# Patient Record
Sex: Female | Born: 1970 | Race: Black or African American | Hispanic: No | Marital: Married | Smoking: Current every day smoker
Health system: Southern US, Community
[De-identification: ages and names within clinical notes are randomized; demographics above are authoritative.]

## PROBLEM LIST (undated history)

## (undated) ENCOUNTER — Emergency Department (HOSPITAL_COMMUNITY): Payer: Self-pay | Source: Home / Self Care

## (undated) DIAGNOSIS — E7800 Pure hypercholesterolemia, unspecified: Secondary | ICD-10-CM

## (undated) DIAGNOSIS — I10 Essential (primary) hypertension: Secondary | ICD-10-CM

## (undated) HISTORY — PX: TUBAL LIGATION: SHX77

## (undated) HISTORY — PX: HERNIA REPAIR: SHX51

---

## 2001-05-01 ENCOUNTER — Emergency Department (HOSPITAL_COMMUNITY): Admission: EM | Admit: 2001-05-01 | Discharge: 2001-05-01 | Payer: Self-pay | Admitting: Emergency Medicine

## 2001-12-22 ENCOUNTER — Emergency Department (HOSPITAL_COMMUNITY): Admission: EM | Admit: 2001-12-22 | Discharge: 2001-12-22 | Payer: Self-pay | Admitting: *Deleted

## 2004-01-16 ENCOUNTER — Emergency Department (HOSPITAL_COMMUNITY): Admission: EM | Admit: 2004-01-16 | Discharge: 2004-01-16 | Payer: Self-pay | Admitting: Emergency Medicine

## 2006-08-21 ENCOUNTER — Emergency Department (HOSPITAL_COMMUNITY): Admission: EM | Admit: 2006-08-21 | Discharge: 2006-08-21 | Payer: Self-pay | Admitting: Emergency Medicine

## 2007-04-05 ENCOUNTER — Emergency Department (HOSPITAL_COMMUNITY): Admission: EM | Admit: 2007-04-05 | Discharge: 2007-04-05 | Payer: Self-pay | Admitting: Emergency Medicine

## 2007-05-09 ENCOUNTER — Emergency Department (HOSPITAL_COMMUNITY): Admission: EM | Admit: 2007-05-09 | Discharge: 2007-05-09 | Payer: Self-pay | Admitting: Emergency Medicine

## 2007-10-30 ENCOUNTER — Emergency Department (HOSPITAL_COMMUNITY): Admission: EM | Admit: 2007-10-30 | Discharge: 2007-10-30 | Payer: Self-pay | Admitting: Emergency Medicine

## 2008-07-28 ENCOUNTER — Emergency Department (HOSPITAL_COMMUNITY): Admission: EM | Admit: 2008-07-28 | Discharge: 2008-07-28 | Payer: Self-pay | Admitting: Emergency Medicine

## 2009-06-24 ENCOUNTER — Emergency Department (HOSPITAL_COMMUNITY): Admission: EM | Admit: 2009-06-24 | Discharge: 2009-06-24 | Payer: Self-pay | Admitting: Emergency Medicine

## 2010-01-05 ENCOUNTER — Emergency Department (HOSPITAL_COMMUNITY): Admission: EM | Admit: 2010-01-05 | Discharge: 2010-01-05 | Payer: Self-pay | Admitting: Emergency Medicine

## 2010-04-28 ENCOUNTER — Emergency Department (HOSPITAL_COMMUNITY): Admission: EM | Admit: 2010-04-28 | Discharge: 2010-04-28 | Payer: Self-pay | Admitting: Emergency Medicine

## 2010-04-30 ENCOUNTER — Emergency Department (HOSPITAL_COMMUNITY): Admission: EM | Admit: 2010-04-30 | Discharge: 2010-04-30 | Payer: Self-pay | Admitting: Emergency Medicine

## 2010-05-11 ENCOUNTER — Emergency Department (HOSPITAL_COMMUNITY): Admission: EM | Admit: 2010-05-11 | Discharge: 2010-05-11 | Payer: Self-pay | Admitting: Emergency Medicine

## 2010-09-12 ENCOUNTER — Emergency Department (HOSPITAL_COMMUNITY)
Admission: EM | Admit: 2010-09-12 | Discharge: 2010-09-12 | Payer: Self-pay | Source: Home / Self Care | Admitting: Emergency Medicine

## 2011-02-18 ENCOUNTER — Emergency Department (HOSPITAL_COMMUNITY)
Admission: EM | Admit: 2011-02-18 | Discharge: 2011-02-18 | Disposition: A | Discharge status: Home / Self Care | Payer: Self-pay | Attending: Emergency Medicine | Admitting: Emergency Medicine

## 2011-02-18 DIAGNOSIS — L02219 Cutaneous abscess of trunk, unspecified: Secondary | ICD-10-CM | POA: Insufficient documentation

## 2011-02-18 DIAGNOSIS — I10 Essential (primary) hypertension: Secondary | ICD-10-CM | POA: Insufficient documentation

## 2011-04-29 ENCOUNTER — Encounter: Payer: Self-pay | Admitting: Emergency Medicine

## 2011-04-29 ENCOUNTER — Emergency Department (HOSPITAL_COMMUNITY)
Admission: EM | Admit: 2011-04-29 | Discharge: 2011-04-29 | Disposition: A | Discharge status: Home / Self Care | Payer: Self-pay | Attending: Emergency Medicine | Admitting: Emergency Medicine

## 2011-04-29 DIAGNOSIS — K611 Rectal abscess: Secondary | ICD-10-CM

## 2011-04-29 DIAGNOSIS — I10 Essential (primary) hypertension: Secondary | ICD-10-CM | POA: Insufficient documentation

## 2011-04-29 DIAGNOSIS — K612 Anorectal abscess: Secondary | ICD-10-CM | POA: Insufficient documentation

## 2011-04-29 DIAGNOSIS — F17200 Nicotine dependence, unspecified, uncomplicated: Secondary | ICD-10-CM | POA: Insufficient documentation

## 2011-04-29 HISTORY — DX: Essential (primary) hypertension: I10

## 2011-04-29 MED ORDER — CEPHALEXIN 500 MG PO CAPS: 500.0000 mg | ORAL_CAPSULE | Freq: Four times a day (QID) | ORAL | Status: AC

## 2011-04-29 MED ORDER — HYDROCODONE-ACETAMINOPHEN 5-325 MG PO TABS: 1.0000 | ORAL_TABLET | ORAL | Status: AC | PRN

## 2011-04-29 MED ORDER — SULFAMETHOXAZOLE-TRIMETHOPRIM 800-160 MG PO TABS: 1.0000 | ORAL_TABLET | Freq: Two times a day (BID) | ORAL | Status: AC

## 2011-04-29 MED ORDER — HYDROCODONE-ACETAMINOPHEN 5-325 MG PO TABS
1.0000 | ORAL_TABLET | Freq: Once | ORAL | Status: AC
  Administered 2011-04-29: 1 via ORAL
  Filled 2011-04-29: qty 1

## 2011-04-29 NOTE — ED Notes (Signed)
C/o perirectal abscess---onset 2-3 days ago---had prior episode about 4 mos. Ago which improved with oral antibiotics but, did not go away completely.  C/o pain, no drainage and is not affecting bowel activity.

## 2011-04-29 NOTE — ED Provider Notes (Signed)
History     CSN: [id number] Arrival date & time: 04/29/2011 10:11 AM  Chief Complaint  Patient presents with  . Abscess   Patient is a 40 y.o. female presenting with abscess. The history is provided by the patient.  Abscess  This is a recurrent problem. The current episode started less than one week ago. The problem occurs continuously. Progression since onset: Had absess in same location 4 months ago,  improved with abx and warm soaks,  became small and hard,  never went away.  Flared up again 2 days agol. Affected Location: Perirectal area. The problem is moderate. The abscess is characterized by painfulness and swelling. Pertinent negatives include no fever and no diarrhea.    Past Medical History  Diagnosis Date  . Hypertension     Past Surgical History  Procedure Date  . Hernia repair   . Tubal ligation     Family History  Problem Relation Age of Onset  . [name]   . Stroke Brother   . Heart attack Brother     History  Substance Use Topics  . Smoking status: Current Everyday Smoker -- 0.2 packs/day  . Smokeless tobacco: Not on file  . Alcohol Use: No    OB History    Grav Para Term Preterm Abortions TAB SAB Ect Mult Living   3 3 3       3       Review of Systems  Constitutional: Negative for fever.  Gastrointestinal: Negative for diarrhea.  All other systems reviewed and are negative.    Physical Exam  BP 137/80  Pulse 60  Temp 98.2 F (36.8 C)  Resp 18  Ht 4\' 11"  (1.499 m)  Wt 158 lb (71.668 kg)  BMI 31.91 kg/m2  SpO2 100%  LMP 04/11/2011  Physical Exam  Nursing note and vitals reviewed. Constitutional: She is oriented to person, place, and time. She appears well-developed and well-nourished.  HENT:  Head: Normocephalic and atraumatic.  Eyes: Conjunctivae are normal.  Neck: Normal range of motion.  Cardiovascular: Normal rate and regular rhythm.   Pulmonary/Chest: Effort normal and breath sounds normal. She has no wheezes.    Abdominal: Soft. Bowel sounds are normal. She exhibits no distension. There is no tenderness.  Genitourinary: Vaginal discharge: Indurated,  linear,  deep palpable mass radiating from anus approx 9 oclock position.  No drainage no fluctuance.  Pain and induration noted along this wall with digital rectal exam,  no drainage.  Musculoskeletal: Normal range of motion.  Neurological: She is alert and oriented to person, place, and time.  Skin: Skin is warm and dry.  Psychiatric: She has a normal mood and affect.    ED Course  Procedures  MDM Spoke with Dr. Lovell Sheehan, will see pt in office in 4 days.  In interim,  Pain meds,  Abx,  Warm soaks.  Abscess indurated,  No fluctuance appreciated,  Early for consideration of I & D.      Candis Musa, PA 04/29/11 1155

## 2011-04-29 NOTE — ED Notes (Signed)
Pt c/o boil to right buttock x 2 days.

## 2011-04-29 NOTE — ED Notes (Signed)
Pt. Stable--awaiting general surgery call back--Kept advised of status

## 2011-05-03 NOTE — ED Provider Notes (Signed)
Medical screening examination/treatment/procedure(s) were performed by non-physician practitioner and as supervising physician I was immediately available for consultation/collaboration.  Benny Lennert, MD 05/03/11 1020

## 2011-05-15 ENCOUNTER — Emergency Department (HOSPITAL_COMMUNITY)
Admission: EM | Admit: 2011-05-15 | Discharge: 2011-05-15 | Discharge status: Against Medical Advice | Payer: Self-pay | Attending: Emergency Medicine | Admitting: Emergency Medicine

## 2011-05-15 ENCOUNTER — Encounter (HOSPITAL_COMMUNITY): Payer: Self-pay

## 2011-05-15 DIAGNOSIS — T148 Other injury of unspecified body region: Secondary | ICD-10-CM | POA: Insufficient documentation

## 2011-05-15 DIAGNOSIS — Z5320 Procedure and treatment not carried out because of patient's decision for unspecified reasons: Secondary | ICD-10-CM | POA: Insufficient documentation

## 2011-05-15 DIAGNOSIS — L0291 Cutaneous abscess, unspecified: Secondary | ICD-10-CM

## 2011-05-15 DIAGNOSIS — W57XXXA Bitten or stung by nonvenomous insect and other nonvenomous arthropods, initial encounter: Secondary | ICD-10-CM | POA: Insufficient documentation

## 2011-05-15 NOTE — ED Notes (Signed)
Pt reports a boil to her right buttocks.  Pt reports coming here a few weeks ago and got a prescription for an antibiotic.  Pt took all of it w/out change.  Pt also reports raised area to her nose that she thinks was from a bug bite.  Pt denies any fever.  nad noted

## 2011-06-19 NOTE — ED Provider Notes (Signed)
Pt left AMA prior to eval  Worthy Rancher, [place] 06/19/11 1354

## 2011-06-22 NOTE — ED Provider Notes (Signed)
Medical screening examination/treatment/procedure(s) were performed by non-physician practitioner and as supervising physician I was immediately available for consultation/collaboration.   Cyndra Numbers, MD 06/22/11 2240

## 2011-08-13 ENCOUNTER — Emergency Department (HOSPITAL_COMMUNITY)
Admission: EM | Admit: 2011-08-13 | Discharge: 2011-08-13 | Disposition: A | Discharge status: Home / Self Care | Payer: Self-pay | Attending: Emergency Medicine | Admitting: Emergency Medicine

## 2011-08-13 ENCOUNTER — Emergency Department (HOSPITAL_COMMUNITY): Payer: Self-pay

## 2011-08-13 ENCOUNTER — Encounter (HOSPITAL_COMMUNITY): Payer: Self-pay | Admitting: *Deleted

## 2011-08-13 DIAGNOSIS — I10 Essential (primary) hypertension: Secondary | ICD-10-CM | POA: Insufficient documentation

## 2011-08-13 DIAGNOSIS — L0291 Cutaneous abscess, unspecified: Secondary | ICD-10-CM

## 2011-08-13 DIAGNOSIS — F17200 Nicotine dependence, unspecified, uncomplicated: Secondary | ICD-10-CM | POA: Insufficient documentation

## 2011-08-13 DIAGNOSIS — L02219 Cutaneous abscess of trunk, unspecified: Secondary | ICD-10-CM | POA: Insufficient documentation

## 2011-08-13 DIAGNOSIS — J111 Influenza due to unidentified influenza virus with other respiratory manifestations: Secondary | ICD-10-CM | POA: Insufficient documentation

## 2011-08-13 DIAGNOSIS — L03319 Cellulitis of trunk, unspecified: Secondary | ICD-10-CM | POA: Insufficient documentation

## 2011-08-13 MED ORDER — IBUPROFEN 800 MG PO TABS: 800.0000 mg | ORAL_TABLET | Freq: Three times a day (TID) | ORAL | Status: AC | PRN

## 2011-08-13 MED ORDER — ACETAMINOPHEN 160 MG/5ML PO SOLN
650.0000 mg | Freq: Once | ORAL | Status: AC
  Administered 2011-08-13: 650 mg via ORAL
  Filled 2011-08-13: qty 20.3

## 2011-08-13 MED ORDER — DOXYCYCLINE HYCLATE 100 MG PO CAPS: 100.0000 mg | ORAL_CAPSULE | Freq: Two times a day (BID) | ORAL | Status: AC

## 2011-08-13 NOTE — ED Notes (Signed)
Pt a/ox4. resp even and unlabored. NAD at this time. D/C instruction and rx x2 reviewed with pt. Pt verbalized understanding. Pt ambulated to lobby with steady gate.

## 2011-08-13 NOTE — ED Notes (Signed)
Pt presents with fever, cough, and chest congestion. Pt states cough was productive but now is not. Pt also reports "boil' to vaginal area. NAD at this time. Pt calm and cooperative.

## 2011-08-13 NOTE — ED Notes (Signed)
Pt states fever, cough, and congestion began last night. Cough was productive last night, green in color. Nonproductive today, per pt. Pt also states boil to vaginal area since yesterday.

## 2011-08-14 NOTE — ED Provider Notes (Signed)
History     CSN: [id number] Arrival date & time: 08/13/2011 11:51 AM   First MD Initiated Contact with Patient 08/13/11 1228      Chief Complaint  Patient presents with  . Cough    (Consider location/radiation/quality/duration/timing/severity/associated sxs/prior treatment) Patient is a 40 y.o. female presenting with flu symptoms. The history is provided by the patient.  Influenza This is a new problem. The current episode started yesterday. The problem occurs constantly. Associated symptoms include chills, congestion, coughing, fatigue, a fever and myalgias. Pertinent negatives include no abdominal pain, arthralgias, change in bowel habit, chest pain, headaches, joint swelling, nausea, neck pain, numbness, rash, sore throat, vomiting or weakness. Associated symptoms comments: She would also like a nodule in her groin evaluated which started several days ago and is painful to palpation.  She is concerned she is developing an abscess.  It is not draining.  She has found no alleviators for this pain.  Palpation and rubbing on clothing makes worse.. The symptoms are aggravated by nothing. She has tried nothing for the symptoms.    Past Medical History  Diagnosis Date  . Hypertension     Past Surgical History  Procedure Date  . Hernia repair   . Tubal ligation   . Tubal ligation     Family History  Problem Relation Age of Onset  . [name]   . Stroke Brother   . Heart attack Brother     History  Substance Use Topics  . Smoking status: Current Everyday Smoker -- 0.2 packs/day  . Smokeless tobacco: Not on file  . Alcohol Use: No    OB History    Grav Para Term Preterm Abortions TAB SAB Ect Mult Living   3 3 3       3       Review of Systems  Constitutional: Positive for fever, chills and fatigue.  HENT: Positive for congestion. Negative for sore throat and neck pain.   Eyes: Negative.   Respiratory: Positive for cough. Negative for chest tightness and shortness  of breath.   Cardiovascular: Negative for chest pain.  Gastrointestinal: Negative for nausea, vomiting, abdominal pain and change in bowel habit.  Genitourinary: Negative.   Musculoskeletal: Positive for myalgias. Negative for joint swelling and arthralgias.  Skin: Negative for rash and wound.  Neurological: Negative for dizziness, weakness, light-headedness, numbness and headaches.  Hematological: Negative.   Psychiatric/Behavioral: Negative.     Allergies  Review of patient's allergies indicates no known allergies.  Home Medications   Current Outpatient Rx  Name Route Sig Dispense Refill  . ATENOLOL 25 MG PO TABS Oral Take 25 mg by mouth daily.      Marland Kitchen DOXYCYCLINE HYCLATE 100 MG PO CAPS Oral Take 1 capsule (100 mg total) by mouth 2 (two) times daily. 20 capsule 0  . IBUPROFEN 200 MG PO TABS Oral Take 400 mg by mouth every 6 (six) hours as needed. For pain     . IBUPROFEN 800 MG PO TABS Oral Take 1 tablet (800 mg total) by mouth every 8 (eight) hours as needed for pain. 21 tablet 0    BP 144/74  Pulse 85  Temp(Src) 97.2 F (36.2 C) (Oral)  Resp 20  Ht 4\' 11"  (1.499 m)  Wt 167 lb (75.751 kg)  BMI 33.73 kg/m2  SpO2 100%  LMP 08/06/2011  Physical Exam  Nursing note and vitals reviewed. Constitutional: She is oriented to person, place, and time. She appears well-developed and well-nourished.  HENT:  Head:  Normocephalic and atraumatic.  Right Ear: Tympanic membrane, external ear and ear canal normal.  Left Ear: Tympanic membrane, external ear and ear canal normal.  Nose: Rhinorrhea present.  Mouth/Throat: Uvula is midline and mucous membranes are normal. No oropharyngeal exudate, posterior oropharyngeal edema, posterior oropharyngeal erythema or tonsillar abscesses.  Eyes: Conjunctivae are normal.  Neck: Normal range of motion.  Cardiovascular: Normal rate, regular rhythm, normal heart sounds and intact distal pulses.   Pulmonary/Chest: Effort normal and breath sounds  normal. She has no wheezes.  Abdominal: Soft. Bowel sounds are normal. There is no tenderness.  Musculoskeletal: Normal range of motion.  Neurological: She is alert and oriented to person, place, and time.  Skin: Skin is warm and dry.       Small tender papule right lower mons pubis without drainage or fluctuance.   Psychiatric: She has a normal mood and affect.    ED Course  Procedures (including critical care time)  Labs Reviewed - No data to display Dg Chest 2 View  08/13/2011  *RADIOLOGY REPORT*  Clinical Data: Cough.  CHEST - 2 VIEW  Comparison: PA and lateral chest 10/30/2007.  Findings: Lungs are clear.  Heart size is normal.  No pneumothorax or pleural effusion.  IMPRESSION: No acute disease.  Original Report Authenticated By: Bernadene Bell. D'ALESSIO, M.D.     1. Influenza   2. Abscess       MDM  Influenza vs nonspecific viral uri.  Papule with no signs suggesting necessity of I & D.  Warm soaks,  doxycycline prescribed.  Ibuprofen for pain,  Fever,  Myalgias.  The patient appears reasonably screened and/or stabilized for discharge and I doubt any other medical condition or other [facility] requiring further screening, evaluation, or treatment in the ED at this time prior to discharge.         Candis Musa, PA 08/14/11 2213

## 2011-08-16 NOTE — ED Provider Notes (Signed)
Medical screening examination/treatment/procedure(s) were conducted as a shared visit with non-physician practitioner(s) and myself.  I personally evaluated the patient during the encounter.  History and physical most consistent with viral syndrome.  Donnetta Hutching, MD 08/16/11 [phone]

## 2012-08-13 ENCOUNTER — Emergency Department (HOSPITAL_COMMUNITY)
Admission: EM | Admit: 2012-08-13 | Discharge: 2012-08-13 | Disposition: A | Discharge status: Home / Self Care | Payer: Self-pay | Attending: Emergency Medicine | Admitting: Emergency Medicine

## 2012-08-13 ENCOUNTER — Encounter (HOSPITAL_COMMUNITY): Payer: Self-pay | Admitting: *Deleted

## 2012-08-13 DIAGNOSIS — Z79899 Other long term (current) drug therapy: Secondary | ICD-10-CM | POA: Insufficient documentation

## 2012-08-13 DIAGNOSIS — Y939 Activity, unspecified: Secondary | ICD-10-CM | POA: Insufficient documentation

## 2012-08-13 DIAGNOSIS — I10 Essential (primary) hypertension: Secondary | ICD-10-CM | POA: Insufficient documentation

## 2012-08-13 DIAGNOSIS — F17200 Nicotine dependence, unspecified, uncomplicated: Secondary | ICD-10-CM | POA: Insufficient documentation

## 2012-08-13 DIAGNOSIS — IMO0002 Reserved for concepts with insufficient information to code with codable children: Secondary | ICD-10-CM | POA: Insufficient documentation

## 2012-08-13 DIAGNOSIS — Y929 Unspecified place or not applicable: Secondary | ICD-10-CM | POA: Insufficient documentation

## 2012-08-13 DIAGNOSIS — E7800 Pure hypercholesterolemia, unspecified: Secondary | ICD-10-CM | POA: Insufficient documentation

## 2012-08-13 DIAGNOSIS — S29019A Strain of muscle and tendon of unspecified wall of thorax, initial encounter: Secondary | ICD-10-CM

## 2012-08-13 DIAGNOSIS — X58XXXA Exposure to other specified factors, initial encounter: Secondary | ICD-10-CM | POA: Insufficient documentation

## 2012-08-13 HISTORY — DX: Pure hypercholesterolemia, unspecified: E78.00

## 2012-08-13 MED ORDER — CYCLOBENZAPRINE HCL 10 MG PO TABS: 10.0000 mg | ORAL_TABLET | Freq: Three times a day (TID) | ORAL | Status: DC | PRN

## 2012-08-13 MED ORDER — HYDROCODONE-ACETAMINOPHEN 5-325 MG PO TABS: 1.0000 | ORAL_TABLET | ORAL | Status: DC | PRN

## 2012-08-13 NOTE — ED Provider Notes (Signed)
History   This chart was scribed for Veronica Human, MD by Toya Smothers, ED Scribe. The patient was seen in room APA01/APA01. Patient's care was started at 0922.  CSN: [id number]  Arrival date & time 08/13/12  4098   First MD Initiated Contact with Patient 08/13/12 [phone]      Chief Complaint  Patient presents with  . Back Pain   HPI  Veronica Arnold is a 41 y.o. female with h/o HTN and high cholesterol, who presents to the Emergency Department complaining of 1 day of sudden onset, constant, moderate mid thoracic and lower rib back pain. Pt does not recall the context of onset, though denies injury mechanism. Pain is non-radiating, aggravated by palpation, and alleviated by nothing. Pt reports mild relief with 800 mg Ibuprofen. No neck pain, gaiting difficulty, abdominal pain, fever, chest pain, SOB, or n/v/d. Pt is a current half a pack a day smoker who denies alcohol and illicit drug use. Surgical Hx includes hernia repair and tubal ligations.   Past Medical History  Diagnosis Date  . Hypertension   . High cholesterol     Past Surgical History  Procedure Date  . Hernia repair   . Tubal ligation   . Tubal ligation     Family History  Problem Relation Age of Onset  . [name]   . Stroke Brother   . Heart attack Brother     History  Substance Use Topics  . Smoking status: Current Every Day Smoker -- 0.2 packs/day    Types: Cigarettes  . Smokeless tobacco: Not on file  . Alcohol Use: No    OB History    Grav Para Term Preterm Abortions TAB SAB Ect Mult Living   3 3 3       3       Review of Systems  Musculoskeletal: Positive for back pain. Negative for gait problem.  All other systems reviewed and are negative.    Allergies  Review of patient's allergies indicates no known allergies.  Home Medications   Current Outpatient Rx  Name  Route  Sig  Dispense  Refill  . ATENOLOL 25 MG PO TABS   Oral   Take 25 mg by mouth daily.           Marland Kitchen  PRESCRIPTION MEDICATION   Oral   Take 1 tablet by mouth at bedtime. Cholesterol medication unknown name and strength           BP 121/66  Pulse 62  Temp 98.3 F (36.8 C) (Oral)  Resp 16  Ht 4\' 11"  (1.499 m)  Wt 155 lb (70.308 kg)  BMI 31.31 kg/m2  SpO2 99%  LMP 08/06/2012  Physical Exam  Nursing note and vitals reviewed. Constitutional: She is oriented to person, place, and time. She appears well-developed and well-nourished. No distress.  HENT:  Head: Normocephalic and atraumatic.  Mouth/Throat: Oropharynx is clear and moist. No oropharyngeal exudate.  Eyes: Conjunctivae normal and EOM are normal. Pupils are equal, round, and reactive to light.  Neck: Normal range of motion. Neck supple. No tracheal deviation present.  Cardiovascular: Normal rate, regular rhythm and normal heart sounds.   No murmur heard. Pulmonary/Chest: Effort normal and breath sounds normal.  Abdominal: Soft. Bowel sounds are normal.  Musculoskeletal: Normal range of motion.       No deformity of lower thoracic and lower ribs.  Neurological: She is alert and oriented to person, place, and time.  Skin: Skin is warm and dry.  She is not diaphoretic.  Psychiatric: She has a normal mood and affect.    ED Course  Procedures DIAGNOSTIC STUDIES: Oxygen Saturation is 99% on room air, normal by my interpretation.    COORDINATION OF CARE: 10:16- Evaluated Pt. Pt is awake, alert, and without distress. 10:21- Patient understand and agree with initial ED impression and plan with expectations set for ED visit.  Rx for thoracic strain with Flexeril and Norco.     1. Strain of thoracic region     I personally performed the services described in this documentation, which was scribed in my presence. The recorded information has been reviewed and is accurate.  Veronica Arnold, M.D.      Carleene Cooper III, MD 08/13/12 [phone]

## 2012-08-13 NOTE — ED Notes (Signed)
Mid to lower back pain since yesterday.  Denies injury.

## 2013-02-10 ENCOUNTER — Emergency Department (HOSPITAL_COMMUNITY)
Admission: EM | Admit: 2013-02-10 | Discharge: 2013-02-10 | Disposition: A | Discharge status: Home / Self Care | Payer: Self-pay | Attending: Emergency Medicine | Admitting: Emergency Medicine

## 2013-02-10 ENCOUNTER — Encounter (HOSPITAL_COMMUNITY): Payer: Self-pay

## 2013-02-10 DIAGNOSIS — Z79899 Other long term (current) drug therapy: Secondary | ICD-10-CM | POA: Insufficient documentation

## 2013-02-10 DIAGNOSIS — I10 Essential (primary) hypertension: Secondary | ICD-10-CM | POA: Insufficient documentation

## 2013-02-10 DIAGNOSIS — E7800 Pure hypercholesterolemia, unspecified: Secondary | ICD-10-CM | POA: Insufficient documentation

## 2013-02-10 DIAGNOSIS — M5431 Sciatica, right side: Secondary | ICD-10-CM

## 2013-02-10 DIAGNOSIS — M5430 Sciatica, unspecified side: Secondary | ICD-10-CM | POA: Insufficient documentation

## 2013-02-10 DIAGNOSIS — M79609 Pain in unspecified limb: Secondary | ICD-10-CM | POA: Insufficient documentation

## 2013-02-10 MED ORDER — CYCLOBENZAPRINE HCL 10 MG PO TABS: 10.0000 mg | ORAL_TABLET | Freq: Two times a day (BID) | ORAL | Status: DC | PRN

## 2013-02-10 MED ORDER — HYDROCODONE-ACETAMINOPHEN 5-325 MG PO TABS: 1.0000 | ORAL_TABLET | ORAL | Status: DC | PRN

## 2013-02-10 NOTE — ED Provider Notes (Signed)
History     CSN: [id number]  Arrival date & time 02/10/13  0909   First MD Initiated Contact with Patient 02/10/13 [phone]      Chief Complaint  Patient presents with  . Back Pain    (Consider location/radiation/quality/duration/timing/severity/associated sxs/prior treatment) Patient is a 42 y.o. female presenting with back pain. The history is provided by the patient.  Back Pain Location:  Lumbar spine Quality:  Stabbing Radiates to:  R posterior upper leg Pain severity:  Severe Pain is:  Same all the time Onset quality:  Gradual Duration:  3 days Timing:  Constant Progression:  Worsening Chronicity:  New Relieved by:  Nothing Worsened by:  Movement, standing and twisting Ineffective treatments:  Ibuprofen Associated symptoms: leg pain   Associated symptoms: no abdominal pain, no bladder incontinence, no bowel incontinence, no chest pain, no dysuria, no fever, no headaches, no numbness and no weakness    Ellarae A Nodine is a 42 y.o. female who presents to the ED with low back pain. The pain started 3 days ago. She rates the pain as 8/10. She went to her doctor when the pain first started and he told her to take Aleve. She took Aleve and it did not help. She denies any injury or UTI symptoms.   Past Medical History  Diagnosis Date  . Hypertension   . High cholesterol     Past Surgical History  Procedure Laterality Date  . Hernia repair    . Tubal ligation    . Tubal ligation      Family History  Problem Relation Age of Onset  . [name]   . Stroke Brother   . Heart attack Brother     History  Substance Use Topics  . Smoking status: Current Every Day Smoker -- 0.25 packs/day    Types: Cigarettes  . Smokeless tobacco: Not on file  . Alcohol Use: No    OB History   Grav Para Term Preterm Abortions TAB SAB Ect Mult Living   3 3 3       3       Review of Systems  Constitutional: Negative for fever and chills.  HENT: Negative for neck pain.     Respiratory: Negative for cough and chest tightness.   Cardiovascular: Negative for chest pain.  Gastrointestinal: Negative for nausea, vomiting, abdominal pain and bowel incontinence.  Genitourinary: Negative for bladder incontinence, dysuria, urgency and frequency.  Musculoskeletal: Positive for back pain.  Skin: Negative for rash.  Neurological: Negative for weakness, numbness and headaches.  Psychiatric/Behavioral: The patient is not nervous/anxious.     Allergies  Review of patient's allergies indicates no known allergies.  Home Medications   Current Outpatient Rx  Name  Route  Sig  Dispense  Refill  . atenolol (TENORMIN) 25 MG tablet   Oral   Take 25 mg by mouth daily.           . cyclobenzaprine (FLEXERIL) 10 MG tablet   Oral   Take 1 tablet (10 mg total) by mouth 3 (three) times daily as needed for muscle spasms.   15 tablet   0   . HYDROcodone-acetaminophen (NORCO/VICODIN) 5-325 MG per tablet   Oral   Take 1 tablet by mouth every 4 (four) hours as needed for pain.   20 tablet   0   . PRESCRIPTION MEDICATION   Oral   Take 1 tablet by mouth at bedtime. Cholesterol medication unknown name and strength  BP 134/70  Pulse 82  Temp(Src) 98.3 F (36.8 C) (Oral)  Resp 20  Ht 4\' 11"  (1.499 m)  Wt 169 lb (76.658 kg)  BMI 34.12 kg/m2  SpO2 99%  LMP 01/23/2013  Physical Exam  Nursing note and vitals reviewed. Constitutional: She is oriented to person, place, and time. She appears well-developed and well-nourished. No distress.  HENT:  Head: Normocephalic.  Eyes: EOM are normal.  Neck: Normal range of motion. Neck supple.  Cardiovascular: Normal rate, regular rhythm and normal heart sounds.   Pulmonary/Chest: Effort normal and breath sounds normal.  Abdominal: Soft. There is no tenderness.  Musculoskeletal:       Lumbar back: She exhibits decreased range of motion, tenderness and spasm. She exhibits normal pulse.       Back:  Pedal pulses  equal bilateral. Adequate circulation, good touch sensation.  Ambulatory with steady gait. No foot drag.  Neurological: She is alert and oriented to person, place, and time. She has normal strength and normal reflexes. No cranial nerve deficit or sensory deficit. Gait normal.  Skin: Skin is warm and dry.  Psychiatric: She has a normal mood and affect. Her behavior is normal. Judgment and thought content normal.    ED Course  Procedures (including critical care time)  MDM  42 y.o. female with low back pain x 3 days. No neurological deficits. Will treat with muscle relaxant and pain management, she will continue Aleve. She is to follow up with her PCP or return here as needed. Patient stable for discharge home without any immediate complications.  Discussed with the patient and all questioned fully answered. She will return if any problems arise.     Medication List    TAKE these medications       cyclobenzaprine 10 MG tablet  Commonly known as:  FLEXERIL  Take 1 tablet (10 mg total) by mouth 2 (two) times daily as needed for muscle spasms.     HYDROcodone-acetaminophen 5-325 MG per tablet  Commonly known as:  NORCO/VICODIN  Take 1 tablet by mouth every 4 (four) hours as needed.      ASK your doctor about these medications       atenolol 25 MG tablet  Commonly known as:  TENORMIN  Take 25 mg by mouth daily.     simvastatin 20 MG tablet  Commonly known as:  ZOCOR  Take 20 mg by mouth every evening.              [address] [place], [place] 02/10/13 [phone]

## 2013-02-10 NOTE — ED Notes (Signed)
Pt presents to er with c/o lower back pain that radiates down bilateral legs, denies any injury, pain started a few days ago and is worse with movement, pt states that she [facility], had urine sample and blood work performed, advised everything was negative and to take aleve for the pain. Pt states that the aleve is not helping with the pain.

## 2013-02-10 NOTE — ED Notes (Signed)
Pt reports back pain for 3 days, denies any known injury, no urinary s.s.

## 2013-02-10 NOTE — ED Provider Notes (Signed)
Medical screening examination/treatment/procedure(s) were performed by non-physician practitioner and as supervising physician I was immediately available for consultation/collaboration.  Sukhraj Esquivias, MD 02/10/13 1612 

## 2013-04-13 ENCOUNTER — Emergency Department (HOSPITAL_COMMUNITY)
Admission: EM | Admit: 2013-04-13 | Discharge: 2013-04-13 | Disposition: A | Discharge status: Home / Self Care | Payer: Self-pay | Attending: Emergency Medicine | Admitting: Emergency Medicine

## 2013-04-13 ENCOUNTER — Encounter (HOSPITAL_COMMUNITY): Payer: Self-pay | Admitting: *Deleted

## 2013-04-13 DIAGNOSIS — I10 Essential (primary) hypertension: Secondary | ICD-10-CM | POA: Insufficient documentation

## 2013-04-13 DIAGNOSIS — L0231 Cutaneous abscess of buttock: Secondary | ICD-10-CM | POA: Insufficient documentation

## 2013-04-13 DIAGNOSIS — E7800 Pure hypercholesterolemia, unspecified: Secondary | ICD-10-CM | POA: Insufficient documentation

## 2013-04-13 DIAGNOSIS — F17200 Nicotine dependence, unspecified, uncomplicated: Secondary | ICD-10-CM | POA: Insufficient documentation

## 2013-04-13 DIAGNOSIS — Z79899 Other long term (current) drug therapy: Secondary | ICD-10-CM | POA: Insufficient documentation

## 2013-04-13 MED ORDER — SULFAMETHOXAZOLE-TRIMETHOPRIM 800-160 MG PO TABS: 1.0000 | ORAL_TABLET | Freq: Two times a day (BID) | ORAL | Status: DC

## 2013-04-13 MED ORDER — TRAMADOL HCL 50 MG PO TABS: 50.0000 mg | ORAL_TABLET | Freq: Four times a day (QID) | ORAL | Status: DC | PRN

## 2013-04-13 NOTE — ED Provider Notes (Signed)
CSN: [id number]     Arrival date & time 04/13/13  0807 History     First MD Initiated Contact with Patient 04/13/13 [phone]     Chief Complaint  Patient presents with  . Abscess   (Consider location/radiation/quality/duration/timing/severity/associated sxs/prior Treatment) Patient is a 42 y.o. female presenting with abscess. The history is provided by the patient.  Abscess Location:  Ano-genital Ano-genital abscess location:  R buttock Size:  0.5 cm Abscess quality: induration, painful and redness   Abscess quality: not draining, no fluctuance and not weeping   Red streaking: no   Duration:  4 days Progression:  Worsening Pain details:    Quality:  Sharp   Severity:  Moderate   Timing:  Intermittent   Progression:  Worsening Chronicity:  Recurrent Context: not diabetes   Context comment:  She reports history of abscess at the same site 2 years ago which drained spontaneously Relieved by:  Nothing Worsened by:  Draining/squeezing Ineffective treatments:  Tar ointment Associated symptoms: no fever and no nausea     Past Medical History  Diagnosis Date  . Hypertension   . High cholesterol    Past Surgical History  Procedure Laterality Date  . Hernia repair    . Tubal ligation    . Tubal ligation     Family History  Problem Relation Age of Onset  . [name]   . Stroke Brother   . Heart attack Brother    History  Substance Use Topics  . Smoking status: Current Every Day Smoker -- 0.25 packs/day    Types: Cigarettes  . Smokeless tobacco: Not on file  . Alcohol Use: No   OB History   Grav Para Term Preterm Abortions TAB SAB Ect Mult Living   3 3 3       3      Review of Systems  Constitutional: Negative for fever and chills.  HENT: Negative for facial swelling.   Respiratory: Negative for shortness of breath and wheezing.   Gastrointestinal: Negative for nausea.  Skin: Positive for wound.  Neurological: Negative for numbness.    Allergies  Review  of patient's allergies indicates no known allergies.  Home Medications   Current Outpatient Rx  Name  Route  Sig  Dispense  Refill  . atenolol (TENORMIN) 25 MG tablet   Oral   Take 25 mg by mouth daily.           . cyclobenzaprine (FLEXERIL) 10 MG tablet   Oral   Take 1 tablet (10 mg total) by mouth 2 (two) times daily as needed for muscle spasms.   20 tablet   0   . HYDROcodone-acetaminophen (NORCO/VICODIN) 5-325 MG per tablet   Oral   Take 1 tablet by mouth every 4 (four) hours as needed.   15 tablet   0   . simvastatin (ZOCOR) 20 MG tablet   Oral   Take 20 mg by mouth every evening.         . sulfamethoxazole-trimethoprim (SEPTRA DS) 800-160 MG per tablet   Oral   Take 1 tablet by mouth every 12 (twelve) hours.   20 tablet   0   . traMADol (ULTRAM) 50 MG tablet   Oral   Take 1 tablet (50 mg total) by mouth every 6 (six) hours as needed for pain.   20 tablet   0    BP 127/61  Pulse 67  Temp(Src) 98.8 F (37.1 C) (Oral)  Resp 16  Ht 4\' 11"  (1.499  m)  Wt 169 lb (76.658 kg)  BMI 34.12 kg/m2  SpO2 98%  LMP 03/17/2013 Physical Exam  Constitutional: She appears well-developed and well-nourished. No distress.  HENT:  Head: Normocephalic.  Neck: Neck supple.  Cardiovascular: Normal rate.   Pulmonary/Chest: Effort normal. She has no wheezes.  Musculoskeletal: Normal range of motion. She exhibits no edema.  Skin:  0.5 cm small, nontenting induration right medial buttock with no radiation toward anus.  Slight 1 cm surrounding erythema.  No drainage.    ED Course   Procedures (including critical care time)  Labs Reviewed - No data to display No results found. 1. Abscess of right buttock     MDM  Encouraged continued warm soaks, avoid squeezing.  Bactrim,  Tramadol , return here or see pcp if this site does not resolve spontaneously or if it enlarges, becomes more painful.  Burgess Amor, PA-C 04/13/13 [phone]

## 2013-04-13 NOTE — ED Provider Notes (Signed)
Medical screening examination/treatment/procedure(s) were performed by non-physician practitioner and as supervising physician I was immediately available for consultation/collaboration.   Rolan Bucco, MD 04/13/13 1438

## 2013-04-13 NOTE — ED Notes (Signed)
Pt states boil on buttocks x 2 days. Denies drainage. NAD

## 2013-10-20 ENCOUNTER — Emergency Department (HOSPITAL_COMMUNITY)
Admission: EM | Admit: 2013-10-20 | Discharge: 2013-10-20 | Disposition: A | Discharge status: Home / Self Care | Payer: Self-pay | Attending: Emergency Medicine | Admitting: Emergency Medicine

## 2013-10-20 ENCOUNTER — Encounter (HOSPITAL_COMMUNITY): Payer: Self-pay | Admitting: Emergency Medicine

## 2013-10-20 DIAGNOSIS — I10 Essential (primary) hypertension: Secondary | ICD-10-CM | POA: Insufficient documentation

## 2013-10-20 DIAGNOSIS — L0201 Cutaneous abscess of face: Secondary | ICD-10-CM | POA: Insufficient documentation

## 2013-10-20 DIAGNOSIS — F17200 Nicotine dependence, unspecified, uncomplicated: Secondary | ICD-10-CM | POA: Insufficient documentation

## 2013-10-20 DIAGNOSIS — E7800 Pure hypercholesterolemia, unspecified: Secondary | ICD-10-CM | POA: Insufficient documentation

## 2013-10-20 DIAGNOSIS — Z792 Long term (current) use of antibiotics: Secondary | ICD-10-CM | POA: Insufficient documentation

## 2013-10-20 DIAGNOSIS — Z79899 Other long term (current) drug therapy: Secondary | ICD-10-CM | POA: Insufficient documentation

## 2013-10-20 DIAGNOSIS — L03211 Cellulitis of face: Principal | ICD-10-CM | POA: Insufficient documentation

## 2013-10-20 DIAGNOSIS — L0291 Cutaneous abscess, unspecified: Secondary | ICD-10-CM

## 2013-10-20 MED ORDER — SULFAMETHOXAZOLE-TMP DS 800-160 MG PO TABS
1.0000 | ORAL_TABLET | Freq: Once | ORAL | Status: AC
  Administered 2013-10-20: 1 via ORAL
  Filled 2013-10-20: qty 1

## 2013-10-20 MED ORDER — HYDROCODONE-ACETAMINOPHEN 5-325 MG PO TABS
1.0000 | ORAL_TABLET | Freq: Once | ORAL | Status: AC
  Administered 2013-10-20: 1 via ORAL
  Filled 2013-10-20: qty 1

## 2013-10-20 MED ORDER — HYDROCODONE-ACETAMINOPHEN 5-325 MG PO TABS: 1.0000 | ORAL_TABLET | ORAL | Status: DC | PRN

## 2013-10-20 MED ORDER — SULFAMETHOXAZOLE-TRIMETHOPRIM 800-160 MG PO TABS: 1.0000 | ORAL_TABLET | Freq: Two times a day (BID) | ORAL | Status: DC

## 2013-10-20 NOTE — ED Provider Notes (Signed)
CSN: [id number]     Arrival date & time 10/20/13  0805 History   First MD Initiated Contact with Patient 10/20/13 [phone]     Chief Complaint  Patient presents with  . Facial Swelling     (Consider location/radiation/quality/duration/timing/severity/associated sxs/prior Treatment) HPI Comments: Veronica Arnold is a 43 y.o. Female presenting with a 1 day history of painful swelling to her right cheek.  She describes this being the site of a spider bite years ago and since has had almost yearly episodes of similar symptoms.  She has been treated with antibiotics and pain medication in the past including warm compresses with complete resolution of this problem until recurrence.  She denies any knots or nodules at the site chronically.  She also denies dental pain.  She denies fevers or chills and there is no radiation of pain which is aching, sharp with palpation.  She's taken no medications prior to arrival for this problem.    The history is provided by the patient.    Past Medical History  Diagnosis Date  . Hypertension   . High cholesterol    Past Surgical History  Procedure Laterality Date  . Hernia repair    . Tubal ligation    . Tubal ligation     Family History  Problem Relation Age of Onset  . [name]   . Stroke Brother   . Heart attack Brother    History  Substance Use Topics  . Smoking status: Current Every Day Smoker -- 0.25 packs/day    Types: Cigarettes  . Smokeless tobacco: Not on file  . Alcohol Use: No   OB History   Grav Para Term Preterm Abortions TAB SAB Ect Mult Living   3 3 3       3      Review of Systems  Constitutional: Negative for fever and chills.  HENT: Positive for facial swelling.   Respiratory: Negative for shortness of breath and wheezing.   Skin: Positive for color change.  Neurological: Negative for numbness.      Allergies  Review of patient's allergies indicates no known allergies.  Home Medications   Current Outpatient  Rx  Name  Route  Sig  Dispense  Refill  . atenolol (TENORMIN) 25 MG tablet   Oral   Take 25 mg by mouth daily.           . cyclobenzaprine (FLEXERIL) 10 MG tablet   Oral   Take 1 tablet (10 mg total) by mouth 2 (two) times daily as needed for muscle spasms.   20 tablet   0   . HYDROcodone-acetaminophen (NORCO/VICODIN) 5-325 MG per tablet   Oral   Take 1 tablet by mouth every 4 (four) hours as needed.   15 tablet   0   . HYDROcodone-acetaminophen (NORCO/VICODIN) 5-325 MG per tablet   Oral   Take 1 tablet by mouth every 4 (four) hours as needed for moderate pain.   15 tablet   0   . simvastatin (ZOCOR) 20 MG tablet   Oral   Take 20 mg by mouth every evening.         . sulfamethoxazole-trimethoprim (SEPTRA DS) 800-160 MG per tablet   Oral   Take 1 tablet by mouth every 12 (twelve) hours.   20 tablet   0   . sulfamethoxazole-trimethoprim (SEPTRA DS) 800-160 MG per tablet   Oral   Take 1 tablet by mouth every 12 (twelve) hours.   20 tablet  0   . traMADol (ULTRAM) 50 MG tablet   Oral   Take 1 tablet (50 mg total) by mouth every 6 (six) hours as needed for pain.   20 tablet   0    BP 127/76  Pulse 57  Temp(Src) 99 F (37.2 C) (Oral)  Resp 16  SpO2 98%  LMP 09/25/2013 Physical Exam  Constitutional: She appears well-developed and well-nourished. No distress.  HENT:  Head: Normocephalic.  Right Ear: Tympanic membrane normal.  Left Ear: Tympanic membrane normal.  Nose: Nose normal.  Mouth/Throat: Uvula is midline and oropharynx is clear and moist. Normal dentition.  Neck: Neck supple.  Cardiovascular: Normal rate.   Pulmonary/Chest: Effort normal. She has no wheezes.  Musculoskeletal: Normal range of motion. She exhibits no edema.  Lymphadenopathy:  Submental lymphadenopathy  Skin: There is erythema.  1 cm raised and indurated lesion right cheek which is erythematous without radiation of erythema.  No draining or pointing.    ED Course  Procedures  (including critical care time) Labs Review Labs Reviewed - No data to display Imaging Review No results found.  EKG Interpretation   None       MDM   Final diagnoses:  Abscess    No fluctuance amenable to I&D, patient is reluctant to have this procedure  unless necessary.  She wants to try antibiotics first. prescribed Bactrim, hydrocodone encouraged warm compresses to the cheek.  Advised to recheck here in 24 hours if she has any significant worsened symptoms.    Burgess Amor, PA-C 10/20/13 0900  Burgess Amor, PA-C 10/20/13 [phone]

## 2013-10-20 NOTE — ED Provider Notes (Signed)
Medical screening examination/treatment/procedure(s) were conducted as a shared visit with non-physician practitioner(s) and myself.  I personally evaluated the patient during the encounter.  EKG Interpretation   None      Hx and PE c/w infected sebaceous cyst  Donnetta HutchingBrian Johney Perotti, MD 10/20/13 [phone]

## 2013-10-20 NOTE — Discharge Instructions (Signed)
Abscess An abscess (boil or furuncle) is an infected area on or under the skin. This area is filled with yellowish-white fluid (pus) and other material (debris). HOME CARE   Only take medicines as told by your doctor.  If you were given antibiotic medicine, take it as directed. Finish the medicine even if you start to feel better.  If gauze is used, follow your doctor's directions for changing the gauze.  To avoid spreading the infection:  Keep your abscess covered with a bandage.  Wash your hands well.  Do not share personal care items, towels, or whirlpools with others.  Avoid skin contact with others.  Keep your skin and clothes clean around the abscess.  Keep all doctor visits as told. GET HELP RIGHT AWAY IF:   You have more pain, puffiness (swelling), or redness in the wound site.  You have more fluid or blood coming from the wound site.  You have muscle aches, chills, or you feel sick.  You have a fever. MAKE SURE YOU:   Understand these instructions.  Will watch your condition.  Will get help right away if you are not doing well or get worse. Document Released: 02/09/2008 Document Revised: 02/22/2012 Document Reviewed: 11/05/2011 [facility] Patient Information 2014 [employer], [place].  Apply warm compresses to your cheek throughout the day to help facilitate drainage.  Take medications as prescribed.  Do not drive within 4 hours of taking hydrocodone as this medication will make you sleepy.  Return here for recheck tomorrow if your symptoms are worse.

## 2013-10-20 NOTE — ED Notes (Signed)
Pt states recurrent skin infection to right side of face x 4 years. Recent redness and swelling noticed yesterday.

## 2014-03-02 ENCOUNTER — Encounter (HOSPITAL_COMMUNITY): Payer: Self-pay | Admitting: Emergency Medicine

## 2014-03-02 ENCOUNTER — Emergency Department (HOSPITAL_COMMUNITY)
Admission: EM | Admit: 2014-03-02 | Discharge: 2014-03-02 | Disposition: A | Discharge status: Home / Self Care | Payer: Self-pay | Attending: Emergency Medicine | Admitting: Emergency Medicine

## 2014-03-02 DIAGNOSIS — L0291 Cutaneous abscess, unspecified: Secondary | ICD-10-CM

## 2014-03-02 DIAGNOSIS — Z79899 Other long term (current) drug therapy: Secondary | ICD-10-CM | POA: Insufficient documentation

## 2014-03-02 DIAGNOSIS — E7800 Pure hypercholesterolemia, unspecified: Secondary | ICD-10-CM | POA: Insufficient documentation

## 2014-03-02 DIAGNOSIS — Z791 Long term (current) use of non-steroidal anti-inflammatories (NSAID): Secondary | ICD-10-CM | POA: Insufficient documentation

## 2014-03-02 DIAGNOSIS — I10 Essential (primary) hypertension: Secondary | ICD-10-CM | POA: Insufficient documentation

## 2014-03-02 DIAGNOSIS — L03119 Cellulitis of unspecified part of limb: Principal | ICD-10-CM

## 2014-03-02 DIAGNOSIS — L02419 Cutaneous abscess of limb, unspecified: Secondary | ICD-10-CM | POA: Insufficient documentation

## 2014-03-02 DIAGNOSIS — F17200 Nicotine dependence, unspecified, uncomplicated: Secondary | ICD-10-CM | POA: Insufficient documentation

## 2014-03-02 MED ORDER — SULFAMETHOXAZOLE-TRIMETHOPRIM 800-160 MG PO TABS: 1.0000 | ORAL_TABLET | Freq: Two times a day (BID) | ORAL | Status: DC

## 2014-03-02 MED ORDER — HYDROCODONE-ACETAMINOPHEN 5-325 MG PO TABS: ORAL_TABLET | ORAL | Status: DC

## 2014-03-02 NOTE — ED Notes (Signed)
Patient c/o abscess to right inner, upper leg x2 days. Per patient small amount of drainage. Denies any fevers.

## 2014-03-02 NOTE — ED Notes (Signed)
Pt alert & oriented x4, stable gait. Patient given discharge instructions, paperwork & prescription(s). Patient  instructed to stop at the registration desk to finish any additional paperwork. Patient verbalized understanding. Pt left department w/ no further questions. 

## 2014-03-02 NOTE — Discharge Instructions (Signed)
Abscess °An abscess (boil or furuncle) is an infected area on or under the skin. This area is filled with yellowish-white fluid (pus) and other material (debris). °HOME CARE  °· Only take medicines as told by your doctor. °· If you were given antibiotic medicine, take it as directed. Finish the medicine even if you start to feel better. °· If gauze is used, follow your doctor's directions for changing the gauze. °· To avoid spreading the infection: °¨ Keep your abscess covered with a bandage. °¨ Wash your hands well. °¨ Do not share personal care items, towels, or whirlpools with others. °¨ Avoid skin contact with others. °· Keep your skin and clothes clean around the abscess. °· Keep all doctor visits as told. °GET HELP RIGHT AWAY IF:  °· You have more pain, puffiness (swelling), or redness in the wound site. °· You have more fluid or blood coming from the wound site. °· You have muscle aches, chills, or you feel sick. °· You have a fever. °MAKE SURE YOU:  °· Understand these instructions. °· Will watch your condition. °· Will get help right away if you are not doing well or get worse. °Document Released: 02/09/2008 Document Revised: 02/22/2012 Document Reviewed: 11/05/2011 °Exit[employer]® Patient Information ©2015 [employer]. This information is not intended to replace advice given to you by your health care provider. Make sure you discuss any questions you have with your health care provider. ° °

## 2014-03-02 NOTE — ED Provider Notes (Signed)
CSN: [id number]     Arrival date & time 03/02/14  [id number] History   First MD Initiated Contact with Patient 03/02/14 0913     Chief Complaint  Patient presents with  . Abscess     (Consider location/radiation/quality/duration/timing/severity/associated sxs/prior Treatment) Patient is a 43 y.o. female presenting with abscess. The history is provided by the patient.  Abscess Location:  Leg Leg abscess location:  R upper leg Size:  2 cm Abscess quality: fluctuance, induration and painful   Abscess quality: not draining, no itching and no redness   Red streaking: no   Duration:  2 days Progression:  Unchanged Pain details:    Quality:  Throbbing   Severity:  Moderate   Timing:  Constant   Progression:  Unchanged Chronicity:  New Context: not diabetes   Relieved by:  Nothing Worsened by:  Nothing tried Ineffective treatments:  None tried Associated symptoms: no anorexia, no fever, no headaches, no nausea and no vomiting     Past Medical History  Diagnosis Date  . Hypertension   . High cholesterol    Past Surgical History  Procedure Laterality Date  . Hernia repair    . Tubal ligation    . Tubal ligation     Family History  Problem Relation Age of Onset  . [name]   . Stroke Brother   . Heart attack Brother    History  Substance Use Topics  . Smoking status: Current Every Day Smoker -- 0.25 packs/day for 4 years    Types: Cigarettes  . Smokeless tobacco: Never Used  . Alcohol Use: No   OB History   Grav Para Term Preterm Abortions TAB SAB Ect Mult Living   3 3 3       3      Review of Systems  Constitutional: Negative for fever and chills.  Gastrointestinal: Negative for nausea, vomiting and anorexia.  Musculoskeletal: Negative for arthralgias and joint swelling.  Skin: Positive for color change.       Abscess   Neurological: Negative for headaches.  Hematological: Negative for adenopathy.  All other systems reviewed and are  negative.     Allergies  Review of patient's allergies indicates no known allergies.  Home Medications   Prior to Admission medications   Medication Sig Start Date End Date Taking? Authorizing Provider  atenolol (TENORMIN) 25 MG tablet Take 25 mg by mouth daily.     Yes Historical Provider, MD  ibuprofen (ADVIL,MOTRIN) 200 MG tablet Take 200 mg by mouth daily as needed for moderate pain.   Yes Historical Provider, MD  simvastatin (ZOCOR) 20 MG tablet Take 20 mg by mouth every evening.   Yes Historical Provider, MD   BP 136/65  Pulse 67  Temp(Src) 98.7 F (37.1 C) (Oral)  Resp 16  Ht 4\' 11"  (1.499 m)  Wt 160 lb (72.576 kg)  BMI 32.30 kg/m2  SpO2 97%  LMP 02/16/2014 Physical Exam  Nursing note and vitals reviewed. Constitutional: She is oriented to person, place, and time. She appears well-developed and well-nourished. No distress.  HENT:  Head: Normocephalic and atraumatic.  Cardiovascular: Normal rate, regular rhythm and normal heart sounds.   No murmur heard. Pulmonary/Chest: Effort normal and breath sounds normal. No respiratory distress.  Neurological: She is alert and oriented to person, place, and time. She exhibits normal muscle tone. Coordination normal.  Skin: Skin is warm and dry. There is erythema.  2 cm abscess to the right medial thigh.  No drainage or surrounding  erythema    ED Course  Procedures (including critical care time) Labs Review Labs Reviewed - No data to display  Imaging Review No results found.   EKG Interpretation None      MDM   Final diagnoses:  Abscess    Pt with 2 cm fluctuant abscess to the right inner thigh.  No cellulitis.  Pt declines I&D at this time, prefers to try warm soaks and antibiotic first.  I have advised her that she may still need I&D and she agrees to return in 2-3 days if the sx's are not improving.  She is stable for d/c    Tammy L. Triplett, PA-C 03/03/14 1551

## 2014-03-05 NOTE — ED Provider Notes (Signed)
Medical screening examination/treatment/procedure(s) were performed by non-physician practitioner and as supervising physician I was immediately available for consultation/collaboration.   EKG Interpretation None        Scott Zackowski, MD 03/05/14 0820 

## 2014-05-20 ENCOUNTER — Other Ambulatory Visit (HOSPITAL_COMMUNITY): Payer: Self-pay | Admitting: Physician Assistant

## 2014-05-20 DIAGNOSIS — Z1231 Encounter for screening mammogram for malignant neoplasm of breast: Secondary | ICD-10-CM

## 2014-05-27 ENCOUNTER — Ambulatory Visit (HOSPITAL_COMMUNITY)
Admission: RE | Admit: 2014-05-27 | Discharge: 2014-05-27 | Disposition: A | Discharge status: Home / Self Care | Payer: Self-pay | Source: Ambulatory Visit | Attending: Physician Assistant | Admitting: Physician Assistant

## 2014-05-27 DIAGNOSIS — Z1231 Encounter for screening mammogram for malignant neoplasm of breast: Secondary | ICD-10-CM

## 2014-07-08 ENCOUNTER — Encounter (HOSPITAL_COMMUNITY): Payer: Self-pay | Admitting: Emergency Medicine

## 2014-07-13 ENCOUNTER — Emergency Department (HOSPITAL_COMMUNITY)
Admission: EM | Admit: 2014-07-13 | Discharge: 2014-07-13 | Disposition: A | Discharge status: Home / Self Care | Payer: Self-pay | Attending: Emergency Medicine | Admitting: Emergency Medicine

## 2014-07-13 ENCOUNTER — Encounter (HOSPITAL_COMMUNITY): Payer: Self-pay | Admitting: Emergency Medicine

## 2014-07-13 DIAGNOSIS — I10 Essential (primary) hypertension: Secondary | ICD-10-CM | POA: Insufficient documentation

## 2014-07-13 DIAGNOSIS — L739 Follicular disorder, unspecified: Secondary | ICD-10-CM | POA: Insufficient documentation

## 2014-07-13 DIAGNOSIS — Z720 Tobacco use: Secondary | ICD-10-CM | POA: Insufficient documentation

## 2014-07-13 DIAGNOSIS — Z79899 Other long term (current) drug therapy: Secondary | ICD-10-CM | POA: Insufficient documentation

## 2014-07-13 DIAGNOSIS — E780 Pure hypercholesterolemia: Secondary | ICD-10-CM | POA: Insufficient documentation

## 2014-07-13 MED ORDER — SULFAMETHOXAZOLE-TRIMETHOPRIM 800-160 MG PO TABS: 1.0000 | ORAL_TABLET | Freq: Two times a day (BID) | ORAL | Status: DC

## 2014-07-13 MED ORDER — HYDROCODONE-ACETAMINOPHEN 5-325 MG PO TABS: ORAL_TABLET | ORAL | Status: DC

## 2014-07-13 NOTE — ED Notes (Signed)
Pt reports abscess to her R groin area, onset of symptoms 2 days ago.

## 2014-07-13 NOTE — Discharge Instructions (Signed)

## 2014-07-13 NOTE — ED Notes (Signed)
Abscess to right groin for 2 days, denies drainage

## 2014-07-15 NOTE — ED Provider Notes (Addendum)
CSN: [id number]     Arrival date & time 07/13/14  1135 History   First MD Initiated Contact with Patient 07/13/14 1152     Chief Complaint  Patient presents with  . Abscess     (Consider location/radiation/quality/duration/timing/severity/associated sxs/prior Treatment) HPI   Veronica Arnold is a 43 y.o. female who presents to the Emergency Department complaining of painful boil to the right groin for 2 days.  She reports h/o same and states that she wanted to have it evaluated before it becomes worse.  She reports that is feel similar to previous boils .  She denies fever, chills, abdominal pain or dysuria.  She states the pain is worse with walking and wearing underwear.     Past Medical History  Diagnosis Date  . Hypertension   . High cholesterol    Past Surgical History  Procedure Laterality Date  . Hernia repair    . Tubal ligation    . Tubal ligation     Family History  Problem Relation Age of Onset  . [name]   . Stroke Brother   . Heart attack Brother    History  Substance Use Topics  . Smoking status: Current Every Day Smoker -- 0.50 packs/day for 4 years    Types: Cigarettes  . Smokeless tobacco: Never Used  . Alcohol Use: No   OB History    Gravida Para Term Preterm AB TAB SAB Ectopic Multiple Living   3 3 3       3      Review of Systems  Constitutional: Negative for fever and chills.  Gastrointestinal: Negative for nausea, vomiting and abdominal pain.  Musculoskeletal: Negative for joint swelling and arthralgias.  Skin: Positive for color change.       Abscess   Hematological: Negative for adenopathy.  All other systems reviewed and are negative.     Allergies  Review of patient's allergies indicates no known allergies.  Home Medications   Prior to Admission medications   Medication Sig Start Date End Date Taking? Authorizing Provider  atenolol (TENORMIN) 25 MG tablet Take 25 mg by mouth daily.     Yes Historical Provider, MD   ibuprofen (ADVIL,MOTRIN) 200 MG tablet Take 200 mg by mouth daily as needed for moderate pain.   Yes Historical Provider, MD  simvastatin (ZOCOR) 20 MG tablet Take 20 mg by mouth every evening.   Yes Historical Provider, MD  HYDROcodone-acetaminophen (NORCO/VICODIN) 5-325 MG per tablet Take one-two tabs po q 4-6 hrs prn pain 07/13/14   Tammy L. Triplett, PA-C  sulfamethoxazole-trimethoprim (SEPTRA DS) 800-160 MG per tablet Take 1 tablet by mouth 2 (two) times daily. For 10 days 07/13/14   Tammy L. Triplett, PA-C   BP 127/75 mmHg  Pulse 87  Temp(Src) 98.8 F (37.1 C) (Oral)  Resp 18  Ht 4\' 11"  (1.499 m)  Wt 160 lb (72.576 kg)  BMI 32.30 kg/m2  SpO2 98%  LMP 07/05/2014 Physical Exam  Constitutional: She is oriented to person, place, and time. She appears well-developed and well-nourished. No distress.  HENT:  Head: Normocephalic and atraumatic.  Cardiovascular: Normal rate, regular rhythm and normal heart sounds.   No murmur heard. Pulmonary/Chest: Effort normal and breath sounds normal. No respiratory distress.  Abdominal: Soft. She exhibits no distension. There is no tenderness. There is no rebound.  Musculoskeletal: Normal range of motion.  Neurological: She is alert and oriented to person, place, and time. She exhibits normal muscle tone. Coordination normal.  Skin: Skin  is warm and dry. There is erythema.  < 2 cm pustule to the right perineum w/o induration, fluctuance or drainage.  No surrounding erythema  Nursing note and vitals reviewed.   ED Course  Procedures (including critical care time) Labs Review Labs Reviewed - No data to display  Imaging Review No results found.   EKG Interpretation None      MDM   Final diagnoses:  Folliculitis   Pt is well appearing.  Small tender pustule to the right perineum w/o fluctuance or induration.  Pt has h/o same.  She agrees to warm soaks, Rx for vicodin and septra.  She agrees to return in few days if the area  worsens.    Tammy L. Rowe Robertriplett, PA-C 07/15/14 1553   Medical screening examination/treatment/procedure(s) were performed by non-physician practitioner and as supervising physician I was immediately available for consultation/collaboration.   EKG Interpretation None       Rolland PorterMark Bonni Neuser, MD 07/26/14 1509  Rolland PorterMark Tondalaya Perren, MD 08/01/14 2203

## 2014-08-21 ENCOUNTER — Emergency Department (HOSPITAL_COMMUNITY)
Admission: EM | Admit: 2014-08-21 | Discharge: 2014-08-21 | Disposition: A | Discharge status: Home / Self Care | Payer: Self-pay | Attending: Emergency Medicine | Admitting: Emergency Medicine

## 2014-08-21 ENCOUNTER — Encounter (HOSPITAL_COMMUNITY): Payer: Self-pay | Admitting: *Deleted

## 2014-08-21 DIAGNOSIS — Z720 Tobacco use: Secondary | ICD-10-CM | POA: Insufficient documentation

## 2014-08-21 DIAGNOSIS — L02416 Cutaneous abscess of left lower limb: Secondary | ICD-10-CM | POA: Insufficient documentation

## 2014-08-21 DIAGNOSIS — Z79899 Other long term (current) drug therapy: Secondary | ICD-10-CM | POA: Insufficient documentation

## 2014-08-21 DIAGNOSIS — E780 Pure hypercholesterolemia: Secondary | ICD-10-CM | POA: Insufficient documentation

## 2014-08-21 DIAGNOSIS — I10 Essential (primary) hypertension: Secondary | ICD-10-CM | POA: Insufficient documentation

## 2014-08-21 DIAGNOSIS — L0291 Cutaneous abscess, unspecified: Secondary | ICD-10-CM

## 2014-08-21 MED ORDER — SULFAMETHOXAZOLE-TRIMETHOPRIM 800-160 MG PO TABS: 1.0000 | ORAL_TABLET | Freq: Two times a day (BID) | ORAL | Status: DC

## 2014-08-21 MED ORDER — MUPIROCIN CALCIUM 2 % EX CREA: TOPICAL_CREAM | Freq: Two times a day (BID) | CUTANEOUS | Status: DC

## 2014-08-21 MED ORDER — MUPIROCIN 2 % EX OINT
TOPICAL_OINTMENT | Freq: Two times a day (BID) | CUTANEOUS | Status: DC
  Administered 2014-08-21: 1 via NASAL
  Filled 2014-08-21: qty 22

## 2014-08-21 NOTE — ED Provider Notes (Signed)
CSN: [id number]     Arrival date & time 08/21/14  [id number] History   First MD Initiated Contact with Patient 08/21/14 [phone]     Chief Complaint  Patient presents with  . Recurrent Skin Infections     (Consider location/radiation/quality/duration/timing/severity/associated sxs/prior Treatment) The history is provided by the patient.   Veronica Arnold is a 43 y.o. female presenting for treatment of a boil which developed in her left upper medial thigh 2 days ago.  She has a history of frequent abscesses which generally resolve on their own with warm compresses and antibiotics.  She has been using warm compresses but so far this particular infection has not yet  started to drain.  She states her last abscess occurred about 2 months ago which resolved with antibiotics.  She has never been tested for MRSA.    Past Medical History  Diagnosis Date  . Hypertension   . High cholesterol    Past Surgical History  Procedure Laterality Date  . Hernia repair    . Tubal ligation    . Tubal ligation     Family History  Problem Relation Age of Onset  . [name]   . Stroke Brother   . Heart attack Brother    History  Substance Use Topics  . Smoking status: Current Every Day Smoker -- 0.50 packs/day for 4 years    Types: Cigarettes  . Smokeless tobacco: Never Used  . Alcohol Use: No   OB History    Gravida Para Term Preterm AB TAB SAB Ectopic Multiple Living   3 3 3       3      Review of Systems  Constitutional: Negative for fever and chills.  Respiratory: Negative for shortness of breath and wheezing.   Skin:       Negative except as mentioned in HPI.    Neurological: Negative for numbness.      Allergies  Review of patient's allergies indicates no known allergies.  Home Medications   Prior to Admission medications   Medication Sig Start Date End Date Taking? Authorizing Provider  ibuprofen (ADVIL,MOTRIN) 200 MG tablet Take 400 mg by mouth daily as needed for moderate  pain.    Yes Historical Provider, MD  atenolol (TENORMIN) 25 MG tablet Take 25 mg by mouth daily.      Historical Provider, MD  HYDROcodone-acetaminophen (NORCO/VICODIN) 5-325 MG per tablet Take one-two tabs po q 4-6 hrs prn pain Patient not taking: Reported on 08/21/2014 07/13/14   Tammy L. Triplett, PA-C  simvastatin (ZOCOR) 20 MG tablet Take 20 mg by mouth every evening.    Historical Provider, MD  sulfamethoxazole-trimethoprim (SEPTRA DS) 800-160 MG per tablet Take 1 tablet by mouth every 12 (twelve) hours. 08/21/14   Burgess AmorJulie Ellason Segar, PA-C   BP 134/80 mmHg  Pulse 76  Temp(Src) 98.7 F (37.1 C) (Oral)  Resp 16  SpO2 100%  LMP 07/26/2014 Physical Exam  Constitutional: She is oriented to person, place, and time. She appears well-developed and well-nourished.  HENT:  Head: Normocephalic.  Cardiovascular: Normal rate.   Pulmonary/Chest: Effort normal.  Neurological: She is alert and oriented to person, place, and time. No sensory deficit.  Skin:  2 cm induration left mid thigh. No surrounding erythema.  No red streaking.    ED Course  Procedures (including critical care time) Labs Review Labs Reviewed - No data to display  Imaging Review No results found.   EKG Interpretation None      MDM  Final diagnoses:  Abscess    Offered I&D which patient deferred.  She was prescribed Bactrim.  She has had multiple ED visits for abscesses, this is the fifth visit for this complaint since August 2014 with no indication of having I&D and no culture collections.  I have offered her treatment with Bactroban for nostril treatment which she agrees with.  She is currently using an antimicrobial soap, encouraged to continue using this.  Continue warm compresses.  Return here for any worsened symptoms.    Burgess AmorJulie Irine Heminger, PA-C 08/21/14 1840  Benny LennertJoseph L Zammit, MD 08/29/14 [phone]

## 2014-08-21 NOTE — ED Notes (Signed)
Boil to left upper thigh x 2 days.

## 2014-08-21 NOTE — Discharge Instructions (Signed)
Abscess An abscess is an infected area that contains a collection of pus and debris.It can occur in almost any part of the body. An abscess is also known as a furuncle or boil. CAUSES  An abscess occurs when tissue gets infected. This can occur from blockage of oil or sweat glands, infection of hair follicles, or a minor injury to the skin. As the body tries to fight the infection, pus collects in the area and creates pressure under the skin. This pressure causes pain. People with weakened immune systems have difficulty fighting infections and get certain abscesses more often.  SYMPTOMS Usually an abscess develops on the skin and becomes a painful mass that is red, warm, and tender. If the abscess forms under the skin, you may feel a moveable soft area under the skin. Some abscesses break open (rupture) on their own, but most will continue to get worse without care. The infection can spread deeper into the body and eventually into the bloodstream, causing you to feel ill.  DIAGNOSIS  Your caregiver will take your medical history and perform a physical exam. A sample of fluid may also be taken from the abscess to determine what is causing your infection. TREATMENT  Your caregiver may prescribe antibiotic medicines to fight the infection. However, taking antibiotics alone usually does not cure an abscess. Your caregiver may need to make a small cut (incision) in the abscess to drain the pus. In some cases, gauze is packed into the abscess to reduce pain and to continue draining the area. HOME CARE INSTRUCTIONS   Only take over-the-counter or prescription medicines for pain, discomfort, or fever as directed by your caregiver.  If you were prescribed antibiotics, take them as directed. Finish them even if you start to feel better.  If gauze is used, follow your caregiver's directions for changing the gauze.  To avoid spreading the infection:  Keep your draining abscess covered with a  bandage.  Wash your hands well.  Do not share personal care items, towels, or whirlpools with others.  Avoid skin contact with others.  Keep your skin and clothes clean around the abscess.  Keep all follow-up appointments as directed by your caregiver. SEEK MEDICAL CARE IF:   You have increased pain, swelling, redness, fluid drainage, or bleeding.  You have muscle aches, chills, or a general ill feeling.  You have a fever. MAKE SURE YOU:   Understand these instructions.  Will watch your condition.  Will get help right away if you are not doing well or get worse. Document Released: 06/02/2005 Document Revised: 02/22/2012 Document Reviewed: 11/05/2011 [facility] Patient Information 2015 [employer] [place]. This information is not intended to replace advice given to you by your health care provider. Make sure you discuss any questions you have with your health care provider.  Continue with warm compresses as you are doing.  Apply the antibody ointment twice daily inside your nostrils using a Q-tip as discussed for the next 5 days.  Return here if this abscess site does not resolve on its own or if it enlarges as it may need to be lanced if this treatment does not work.

## 2014-11-06 ENCOUNTER — Encounter (HOSPITAL_COMMUNITY): Payer: Self-pay | Admitting: Emergency Medicine

## 2014-11-06 ENCOUNTER — Emergency Department (HOSPITAL_COMMUNITY)
Admission: EM | Admit: 2014-11-06 | Discharge: 2014-11-06 | Discharge status: Against Medical Advice | Payer: Self-pay | Attending: Emergency Medicine | Admitting: Emergency Medicine

## 2014-11-06 DIAGNOSIS — Z720 Tobacco use: Secondary | ICD-10-CM | POA: Insufficient documentation

## 2014-11-06 DIAGNOSIS — I10 Essential (primary) hypertension: Secondary | ICD-10-CM | POA: Insufficient documentation

## 2014-11-06 DIAGNOSIS — L0231 Cutaneous abscess of buttock: Secondary | ICD-10-CM | POA: Insufficient documentation

## 2014-11-06 NOTE — ED Notes (Signed)
Patient came to nursing desk and stated "I'm going to have to leave. I'm starting a new job at [employer] today and I really need this job. I'll come back later."

## 2014-11-06 NOTE — ED Notes (Signed)
Pt c/o abscess on her L buttock.

## 2015-01-14 ENCOUNTER — Emergency Department (HOSPITAL_COMMUNITY)
Admission: EM | Admit: 2015-01-14 | Discharge: 2015-01-14 | Disposition: A | Discharge status: Home / Self Care | Payer: Self-pay | Attending: Emergency Medicine | Admitting: Emergency Medicine

## 2015-01-14 ENCOUNTER — Encounter (HOSPITAL_COMMUNITY): Payer: Self-pay | Admitting: Emergency Medicine

## 2015-01-14 DIAGNOSIS — E780 Pure hypercholesterolemia: Secondary | ICD-10-CM | POA: Insufficient documentation

## 2015-01-14 DIAGNOSIS — I10 Essential (primary) hypertension: Secondary | ICD-10-CM | POA: Insufficient documentation

## 2015-01-14 DIAGNOSIS — L0231 Cutaneous abscess of buttock: Secondary | ICD-10-CM | POA: Insufficient documentation

## 2015-01-14 DIAGNOSIS — Z720 Tobacco use: Secondary | ICD-10-CM | POA: Insufficient documentation

## 2015-01-14 DIAGNOSIS — Z79899 Other long term (current) drug therapy: Secondary | ICD-10-CM | POA: Insufficient documentation

## 2015-01-14 MED ORDER — SULFAMETHOXAZOLE-TRIMETHOPRIM 800-160 MG PO TABS: 1.0000 | ORAL_TABLET | Freq: Two times a day (BID) | ORAL | Status: DC

## 2015-01-14 MED ORDER — HYDROCODONE-ACETAMINOPHEN 5-325 MG PO TABS
1.0000 | ORAL_TABLET | Freq: Once | ORAL | Status: AC
  Administered 2015-01-14: 1 via ORAL
  Filled 2015-01-14: qty 1

## 2015-01-14 MED ORDER — SULFAMETHOXAZOLE-TRIMETHOPRIM 800-160 MG PO TABS
1.0000 | ORAL_TABLET | Freq: Once | ORAL | Status: AC
  Administered 2015-01-14: 1 via ORAL
  Filled 2015-01-14: qty 1

## 2015-01-14 MED ORDER — HYDROCODONE-ACETAMINOPHEN 5-325 MG PO TABS: 1.0000 | ORAL_TABLET | ORAL | Status: DC | PRN

## 2015-01-14 NOTE — ED Provider Notes (Signed)
CSN: [id number]     Arrival date & time 01/14/15  [id number] History   First MD Initiated Contact with Patient 01/14/15 [phone]     Chief Complaint  Patient presents with  . Abscess     (Consider location/radiation/quality/duration/timing/severity/associated sxs/prior Treatment) Patient is a 44 y.o. female presenting with abscess. The history is provided by the patient.  Abscess Location:  Ano-genital Ano-genital abscess location:  L buttock Size:  1 cm Abscess quality: draining, fluctuance and painful   Red streaking: no   Duration:  2 days Progression:  Worsening Pain details:    Quality:  Sharp   Severity:  Moderate   Duration:  1 day   Timing:  Constant   Progression:  Worsening Chronicity:  Recurrent Context: not diabetes, not insect bite/sting and not skin injury   Context comment:  Pt has had occasional abscesses, new at this location. Relieved by:  Nothing Worsened by:  Nothing tried Ineffective treatments:  Warm compresses Associated symptoms: no fever, no nausea and no vomiting   Risk factors: prior abscess   Risk factors: no hx of MRSA     Past Medical History  Diagnosis Date  . Hypertension   . High cholesterol    Past Surgical History  Procedure Laterality Date  . Hernia repair    . Tubal ligation    . Tubal ligation     Family History  Problem Relation Age of Onset  . [name]   . Stroke Brother   . Heart attack Brother    History  Substance Use Topics  . Smoking status: Current Every Day Smoker -- 0.50 packs/day for 4 years    Types: Cigarettes  . Smokeless tobacco: Never Used  . Alcohol Use: No   OB History    Gravida Para Term Preterm AB TAB SAB Ectopic Multiple Living   3 3 3       3      Review of Systems  Constitutional: Negative for fever and chills.  Respiratory: Negative for shortness of breath and wheezing.   Gastrointestinal: Negative for nausea and vomiting.  Skin: Negative for color change.       Negative except as  mentioned in HPI.    Neurological: Negative for numbness.      Allergies  Review of patient's allergies indicates no known allergies.  Home Medications   Prior to Admission medications   Medication Sig Start Date End Date Taking? Authorizing Provider  atenolol (TENORMIN) 25 MG tablet Take 25 mg by mouth daily.      Historical Provider, MD  HYDROcodone-acetaminophen (NORCO/VICODIN) 5-325 MG per tablet Take 1 tablet by mouth every 4 (four) hours as needed for moderate pain. 01/14/15   Burgess AmorJulie Ashok Sawaya, PA-C  ibuprofen (ADVIL,MOTRIN) 200 MG tablet Take 400 mg by mouth daily as needed for moderate pain.     Historical Provider, MD  simvastatin (ZOCOR) 20 MG tablet Take 20 mg by mouth every evening.    Historical Provider, MD  sulfamethoxazole-trimethoprim (BACTRIM DS,SEPTRA DS) 800-160 MG per tablet Take 1 tablet by mouth 2 (two) times daily. 01/14/15   Burgess AmorJulie Rehmat Murtagh, PA-C   BP 125/63 mmHg  Pulse 71  Temp(Src) 98.2 F (36.8 C) (Oral)  Resp 15  Ht 4\' 11"  (1.499 m)  Wt 167 lb (75.751 kg)  BMI 33.71 kg/m2  SpO2 98%  LMP 12/25/2014 Physical Exam  Constitutional: She appears well-developed and well-nourished. No distress.  HENT:  Head: Normocephalic.  Neck: Neck supple.  Cardiovascular: Normal rate.   Pulmonary/Chest:  Effort normal. She has no wheezes.  Musculoskeletal: Normal range of motion. She exhibits no edema.  Skin: Skin is warm. There is erythema.  Localized erythema at a 1 cm slightly raised and tender on her left inferior buttock.  There is a small amount of drainage on the bandage at presentation.  There is no induration, there is mild central fluctuance.  No surrounding erythema, no red streaking.    ED Course  Procedures (including critical care time) Labs Review Labs Reviewed - No data to display  Imaging Review No results found.   EKG Interpretation None      MDM   Final diagnoses:  Abscess of buttock, left    Patient was offered I&D today although this  abscess site is relatively small and appears to be resolving spontaneously.  Patient deferred this treatment today.  She was placed on Bactrim, encouraged to continue warm soaks, avoid squeezing the site.  Follow-up with PCP or return here for any worsened or persistent symptoms.  She is prescribed a few hydrocodone for pain relief.  The patient appears reasonably screened and/or stabilized for discharge and I doubt any other medical condition or other [facility] requiring further screening, evaluation, or treatment in the ED at this time prior to discharge.     Burgess AmorJulie Rai Severns, PA-C 01/14/15 [id number]  Eber HongBrian Miller, MD 01/14/15 [phone]

## 2015-01-14 NOTE — Discharge Instructions (Signed)

## 2015-01-14 NOTE — ED Notes (Signed)
Patient with c/o abscess to right buttocks x 2 days.

## 2015-07-20 ENCOUNTER — Encounter (HOSPITAL_COMMUNITY): Payer: Self-pay | Admitting: *Deleted

## 2015-07-20 ENCOUNTER — Emergency Department (HOSPITAL_COMMUNITY)
Admission: EM | Admit: 2015-07-20 | Discharge: 2015-07-20 | Disposition: A | Discharge status: Home / Self Care | Payer: Self-pay | Attending: Emergency Medicine | Admitting: Emergency Medicine

## 2015-07-20 DIAGNOSIS — IMO0002 Reserved for concepts with insufficient information to code with codable children: Secondary | ICD-10-CM

## 2015-07-20 DIAGNOSIS — I10 Essential (primary) hypertension: Secondary | ICD-10-CM | POA: Insufficient documentation

## 2015-07-20 DIAGNOSIS — Z792 Long term (current) use of antibiotics: Secondary | ICD-10-CM | POA: Insufficient documentation

## 2015-07-20 DIAGNOSIS — M65311 Trigger thumb, right thumb: Secondary | ICD-10-CM | POA: Insufficient documentation

## 2015-07-20 DIAGNOSIS — Z79899 Other long term (current) drug therapy: Secondary | ICD-10-CM | POA: Insufficient documentation

## 2015-07-20 DIAGNOSIS — F17210 Nicotine dependence, cigarettes, uncomplicated: Secondary | ICD-10-CM | POA: Insufficient documentation

## 2015-07-20 DIAGNOSIS — M65332 Trigger finger, left middle finger: Secondary | ICD-10-CM | POA: Insufficient documentation

## 2015-07-20 MED ORDER — NAPROXEN 500 MG PO TABS: 500.0000 mg | ORAL_TABLET | Freq: Two times a day (BID) | ORAL | Status: DC

## 2015-07-20 NOTE — ED Provider Notes (Signed)
History  By signing my name below, I, Karle Plumber, attest that this documentation has been prepared under the direction and in the presence of Arthor Captain, PA-C. Electronically Signed: Karle Plumber, ED Scribe. 07/20/2015. 12:50 PM.  Chief Complaint  Patient presents with  . Hand Pain   The history is provided by the patient and medical records. No language interpreter was used.    HPI Comments:  Veronica Arnold is a 44 y.o. female who presents to the Emergency Department complaining of left thumb pain that began approximately one week ago. Pt states she also has pain in the right third digit but it is not as bad as the left thumb. It is difficult for her to bend both of the digits and both have associated swelling. She has not done anything to treat her symptoms. Moving the fingers intensify the pain. She denies modifying factors. She denies fever, chills, nausea, vomiting, trauma or injury.   Past Medical History  Diagnosis Date  . Hypertension   . High cholesterol    Past Surgical History  Procedure Laterality Date  . Hernia repair    . Tubal ligation    . Tubal ligation     Family History  Problem Relation Age of Onset  . [name]   . Stroke Brother   . Heart attack Brother    Social History  Substance Use Topics  . Smoking status: Current Every Day Smoker -- 0.50 packs/day for 4 years    Types: Cigarettes  . Smokeless tobacco: Never Used  . Alcohol Use: No   OB History    Gravida Para Term Preterm AB TAB SAB Ectopic Multiple Living   Review of Systems  Constitutional: Negative for fever and chills.  Gastrointestinal: Negative for nausea and vomiting.  Musculoskeletal: Positive for joint swelling and arthralgias.  Skin: Negative for color change and wound.    Allergies  Review of patient's allergies indicates no known allergies.  Home Medications   Prior to Admission medications   Medication Sig Start Date End Date  Taking? Authorizing Provider  atenolol (TENORMIN) 25 MG tablet Take 25 mg by mouth daily.      Historical Provider, MD  HYDROcodone-acetaminophen (NORCO/VICODIN) 5-325 MG per tablet Take 1 tablet by mouth every 4 (four) hours as needed for moderate pain. 01/14/15   Burgess Amor, PA-C  ibuprofen (ADVIL,MOTRIN) 200 MG tablet Take 400 mg by mouth daily as needed for moderate pain.     Historical Provider, MD  naproxen (NAPROSYN) 500 MG tablet Take 1 tablet (500 mg total) by mouth 2 (two) times daily with a meal. 07/20/15   Arthor Captain, PA-C  simvastatin (ZOCOR) 20 MG tablet Take 20 mg by mouth every evening.    Historical Provider, MD  sulfamethoxazole-trimethoprim (BACTRIM DS,SEPTRA DS) 800-160 MG per tablet Take 1 tablet by mouth 2 (two) times daily. 01/14/15   Burgess Amor, PA-C   Triage Vitals: BP 136/79 mmHg  Pulse 83  Temp(Src) 98.2 F (36.8 C) (Oral)  Resp 16  Ht  (1.499 m)  Wt 167 lb (75.751 kg)  BMI 33.71 kg/m2  SpO2 99%  LMP 07/06/2015 Physical Exam  Constitutional: She is oriented to person, place, and time. She appears well-developed and well-nourished.  HENT:  Head: Normocephalic and atraumatic.  Eyes: EOM are normal.  Neck: Normal range of motion.  Cardiovascular: Normal rate.   Pulmonary/Chest: Effort normal.  Musculoskeletal: She exhibits  tenderness.  Right thumb is tender to palpation, erythematous and inflamed. Locks with flexion. Left middle finger tender to palpation. No erythema. Locks with flexion.  Neurological: She is alert and oriented to person, place, and time.  Skin: Skin is warm and dry.  Psychiatric: She has a normal mood and affect. Her behavior is normal.  Nursing note and vitals reviewed.   ED Course  Procedures (including critical care time) DIAGNOSTIC STUDIES: Oxygen Saturation is 99% on RA, normal by my interpretation.   COORDINATION OF CARE: 12:35 PM- Will refer to hand specialist and prescribe NSAID. Will apply splint to both fingers. Pt  verbalizes understanding and agrees to plan.  Medications - No data to display   MDM   Final diagnoses:  Trigger finger of both hands    Patient with trigger finger  treat with RICE. F/u with hand No signs of infection. Previous hx of same. The patient appears reasonably screened and/or stabilized for discharge and I doubt any other medical condition or other [facility] requiring further screening, evaluation, or treatment in the ED at this time prior to discharge.   I personally performed the services described in this documentation, which was scribed in my presence. The recorded information has been reviewed and is accurate.       Arthor CaptainAbigail Virgel Haro, PA-C 07/20/15 1622  Mancel BaleElliott Wentz, MD 07/20/15 [phone]

## 2015-07-20 NOTE — ED Notes (Signed)
Pt states her left hand thumb is swollen, red, and tender. Pt denies any injury. No other symptoms.

## 2015-07-20 NOTE — Discharge Instructions (Signed)
Trigger Finger Trigger finger (digital tendinitis and stenosing tenosynovitis) is a common disorder that causes an often painful catching of the fingers or thumb. It occurs as a clicking, snapping, or locking of a finger in the palm of the hand. This is caused by a problem with the tendons that flex or bend the fingers sliding smoothly through their sheaths. The condition may occur in any finger or a couple fingers at the same time.  The finger may lock with the finger curled or suddenly straighten out with a snap. This is more common in patients with rheumatoid arthritis and diabetes. Left untreated, the condition may get worse to the point where the finger becomes locked in flexion, like making a fist, or less commonly locked with the finger straightened out. CAUSES   Inflammation and scarring that lead to swelling around the tendon sheath.  Repeated or forceful movements.  Rheumatoid arthritis, an autoimmune disease that affects joints.  Gout.  Diabetes mellitus. SIGNS AND SYMPTOMS  Soreness and swelling of your finger.  A painful clicking or snapping as you bend and straighten your finger. DIAGNOSIS  Your health care provider will do a physical exam of your finger to diagnose trigger finger. TREATMENT   Splinting for 6-8 weeks may be helpful.  Nonsteroidal anti-inflammatory medicines (NSAIDs) can help to relieve the pain and inflammation.  Cortisone injections, along with splinting, may speed up recovery. Several injections may be required. Cortisone may give relief after one injection.  Surgery is another treatment that may be used if conservative treatments do not work. Surgery can be minor, without incisions (a cut does not have to be made), and can be done with a needle through the skin.  Other surgical choices involve an open procedure in which the surgeon opens the hand through a small incision and cuts the pulley so the tendon can again slide smoothly. Your hand will still  work fine. HOME CARE INSTRUCTIONS  Apply ice to the injured area, twice per day:  Put ice in a plastic bag.  Place a towel between your skin and the bag.  Leave the ice on for 20 minutes, 3-4 times a day.  Rest your hand often. MAKE SURE YOU:   Understand these instructions.  Will watch your condition.  Will get help right away if you are not doing well or get worse.   This information is not intended to replace advice given to you by your health care provider. Make sure you discuss any questions you have with your health care provider.   Document Released: 06/12/2004 Document Revised: 04/25/2013 Document Reviewed: 01/23/2013 Elsevier Interactive Patient Education 2016 [employer]  Tendinitis and Tenosynovitis  Tendinitis is inflammation of the tendon. Tenosynovitis is inflammation of the lining around the tendon (tendon sheath). These painful conditions often occur at once. Tendons attach muscle to bone. To move a limb, force from the muscle moves through the tendon, to the bone. These conditions often cause increased pain when moving. Tendinitis may be caused by a small or partial tear in the tendon.  SYMPTOMS   Pain, tenderness, redness, bruising, or swelling at the injury.  Loss of normal joint movement.  Pain that gets worse with use of the muscle and joint attached to the tendon.  Weakness in the tendon, caused by calcium build up that may occur with tendinitis.  Commonly affected tendons:  Achilles tendon (calf of leg).  Rotator cuff (shoulder joint).  Patellar tendon (kneecap to shin).  Peroneal tendon (ankle).  Posterior tibial  tendon (inner ankle).  Biceps tendon (in front of shoulder). CAUSES   Sudden strain on a flexed muscle, muscle overuse, sudden increase or change in activity, vigorous activity.  Result of a direct hit (less common).  Poor muscle action (biomechanics). RISK INCREASES WITH:  Injury (trauma).  Too much exercise.  Sudden  change in athletic activity.  Incorrect exercise form or technique.  Poor strength and flexibility.  Not warming-up properly before activity.  Returning to activity before healing is complete. PREVENTION   Warm-up and stretch properly before activity.  Maintain physical fitness:  Joint flexibility.  Muscle strength and endurance.  Fitness that increases heart rate.  Learn and use proper exercise techniques.  Use rehabilitation exercises to strengthen weak muscles and tendons.  Ice the tendon after activity, to reduce recurring inflammation.  Wear proper fitting protective equipment for specific tendons, when indicated. PROGNOSIS  When treated properly, can be cured in 6 to 8 weeks. Recovery may take longer, depending on degree of injury.  RELATED COMPLICATIONS   Re-injury or recurring symptoms.  Permanent weakness or joint stiffness, if injury is severe and recovery is not completed.  Delayed healing, if sports are started before healing is complete.  Tearing apart (rupture) of the inflamed tendon. Tendinitis means the tendon is injured and must recover. TREATMENT  Treatment first involves ice, medicine, and rest from aggravating activities. This reduces pain and inflammation. Modifying your activity may be considered to prevent recurring injury. A brace, elastic bandage wrap, splint, cast, or sling may be prescribed to protect the joint for a short period. After that period, strengthening and stretching exercise may help to regain strength and full range of motion. If the condition persists, despite non-surgical treatment, surgery may be recommended to remove the inflamed tendon lining. Corticosteroid injections may be given to reduce inflammation. However, these injections may weaken the tendon and increase your risk for tendon rupture. MEDICATION   If pain medicine is needed, nonsteroidal anti-inflammatory medicines (aspirin and ibuprofen), or other minor pain relievers  (acetaminophen), are often recommended.  Do not take pain medicine for 7 days before surgery.  Prescription pain relievers are usually prescribed only after surgery. Use only as directed and only as much as you need.  Ointments applied to the skin may be helpful.  Corticosteroid injections may be given to reduce inflammation. However, this may increase your risk of a tendon rupture. HEAT AND COLD  Cold treatment (icing) relieves pain and reduces inflammation. Cold treatment should be applied for 10 to 15 minutes every 2 to 3 hours, and immediately after activity that aggravates your symptoms. Use ice packs or an ice massage.  Heat treatment may be used before performing stretching and strengthening activities prescribed by your caregiver, physical therapist, or athletic trainer. Use a heat pack or a warm water soak. SEEK MEDICAL CARE IF:   Symptoms get worse or do not improve, despite treatment.  Pain becomes too much to tolerate.  You develop numbness or tingling.  Toes become cold, or toenails become blue, gray, or dark colored.  New, unexplained symptoms develop. (Drugs used in treatment may produce side effects.)   This information is not intended to replace advice given to you by your health care provider. Make sure you discuss any questions you have with your health care provider.   Document Released: 08/23/2005 Document Revised: 11/15/2011 Document Reviewed: 12/05/2008 Elsevier Interactive Patient Education [employer]

## 2015-11-17 ENCOUNTER — Ambulatory Visit: Payer: Self-pay | Admitting: Physician Assistant

## 2015-11-24 ENCOUNTER — Other Ambulatory Visit: Payer: Self-pay | Admitting: Physician Assistant

## 2015-11-24 LAB — COMPREHENSIVE METABOLIC PANEL
ALBUMIN: 3.7 g/dL (ref 3.6–5.1)
ALK PHOS: 59 U/L (ref 33–115)
ALT: 12 U/L (ref 6–29)
AST: 12 U/L (ref 10–30)
BILIRUBIN TOTAL: 0.3 mg/dL (ref 0.2–1.2)
BUN: 7 mg/dL (ref 7–25)
CO2: 26 mmol/L (ref 20–31)
CREATININE: 0.7 mg/dL (ref 0.50–1.10)
Calcium: 9.2 mg/dL (ref 8.6–10.2)
Chloride: 107 mmol/L (ref 98–110)
Glucose, Bld: 97 mg/dL (ref 65–99)
Potassium: 4.2 mmol/L (ref 3.5–5.3)
SODIUM: 140 mmol/L (ref 135–146)
Total Protein: 6.4 g/dL (ref 6.1–8.1)

## 2015-11-24 LAB — CBC
HCT: 40.8 % (ref 36.0–46.0)
Hemoglobin: 13 g/dL (ref 12.0–15.0)
MCH: 27.1 pg (ref 26.0–34.0)
MCHC: 31.9 g/dL (ref 30.0–36.0)
MCV: 85.2 fL (ref 78.0–100.0)
MPV: 9.7 fL (ref 8.6–12.4)
PLATELETS: 288 10*3/uL (ref 150–400)
RBC: 4.79 MIL/uL (ref 3.87–5.11)
RDW: 14.9 % (ref 11.5–15.5)
WBC: 8.3 10*3/uL (ref 4.0–10.5)

## 2015-11-24 LAB — LIPID PANEL
CHOLESTEROL: 167 mg/dL (ref 125–200)
HDL: 47 mg/dL (ref >=46)
LDL Cholesterol: 102 mg/dL (ref <130)
TRIGLYCERIDES: 92 mg/dL (ref <150)
Total CHOL/HDL Ratio: 3.6 Ratio (ref <=5.0)
VLDL: 18 mg/dL (ref <30)

## 2015-11-25 ENCOUNTER — Ambulatory Visit: Payer: Self-pay | Admitting: Physician Assistant

## 2015-11-25 ENCOUNTER — Encounter: Payer: Self-pay | Admitting: Physician Assistant

## 2015-11-25 VITALS — BP 146/86 | HR 68 | Temp 97.9°F | Ht 59.0 in | Wt 189.6 lb

## 2015-11-25 DIAGNOSIS — E785 Hyperlipidemia, unspecified: Secondary | ICD-10-CM

## 2015-11-25 DIAGNOSIS — F17210 Nicotine dependence, cigarettes, uncomplicated: Secondary | ICD-10-CM

## 2015-11-25 DIAGNOSIS — I10 Essential (primary) hypertension: Secondary | ICD-10-CM

## 2015-11-25 DIAGNOSIS — R109 Unspecified abdominal pain: Secondary | ICD-10-CM

## 2015-11-25 DIAGNOSIS — E669 Obesity, unspecified: Secondary | ICD-10-CM

## 2015-11-25 NOTE — Progress Notes (Signed)
BP 146/86 mmHg  Pulse 68  Temp(Src) 97.9 F (36.6 C)  Ht 4\' 11"  (1.499 m)  Wt 189 lb 9.6 oz (86.002 kg)  BMI 38.27 kg/m2  SpO2 99%   Subjective:    Patient ID: Veronica Arnold, female    DOB: Jan 13, 1971, 45 y.o.   MRN: [id number]  HPI: Veronica Arnold is a 45 y.o. female presenting on 11/25/2015 for Hypertension and Hyperlipidemia   HPI  Pt states she thinks she has gallstones.  She feels them moving around in her abdomen and she hurts after she eats.  Says it has been hurting after mostsly every meal.  Pain lasts about 30 minutes.  At other times, she does not have abd pain.  She has regular BMs, denies diarrhea and constipation.  No emesis or nausea.  Menses regular and normal.  When hurting, pain is bilat and about the level of her umbilicus.  Relevant past medical, surgical, family and social history reviewed and updated as indicated. Interim medical history since our last visit reviewed. Allergies and medications reviewed and updated.  Current outpatient prescriptions:  .  atenolol (TENORMIN) 25 MG tablet, Take 25 mg by mouth daily.  , Disp: , Rfl:  .  simvastatin (ZOCOR) 20 MG tablet, Take 20 mg by mouth every evening., Disp: , Rfl:    Review of Systems  Constitutional: Positive for diaphoresis. Negative for fever, chills, appetite change, fatigue and unexpected weight change.  HENT: Negative for congestion, dental problem, drooling, ear pain, facial swelling, hearing loss, mouth sores, sneezing, sore throat, trouble swallowing and voice change.   Eyes: Positive for redness and itching. Negative for pain, discharge and visual disturbance.  Respiratory: Negative for cough, choking, shortness of breath and wheezing.   Cardiovascular: Negative for chest pain, palpitations and leg swelling.  Gastrointestinal: Negative for vomiting, abdominal pain, diarrhea, constipation and blood in stool.  Endocrine: Negative for cold intolerance, heat intolerance and polydipsia.   Genitourinary: Negative for dysuria, hematuria and decreased urine volume.  Musculoskeletal: Positive for back pain, arthralgias and gait problem.  Skin: Negative for rash.  Allergic/Immunologic: Negative for environmental allergies.  Neurological: Negative for seizures, syncope, light-headedness and headaches.  Hematological: Negative for adenopathy.  Psychiatric/Behavioral: Negative for suicidal ideas, dysphoric mood and agitation. The patient is not nervous/anxious.     Per HPI unless specifically indicated above     Objective:    BP 146/86 mmHg  Pulse 68  Temp(Src) 97.9 F (36.6 C)  Ht 4\' 11"  (1.499 m)  Wt 189 lb 9.6 oz (86.002 kg)  BMI 38.27 kg/m2  SpO2 99%  Wt Readings from Last 3 Encounters:  11/25/15 189 lb 9.6 oz (86.002 kg)  07/20/15 167 lb (75.751 kg)  01/14/15 167 lb (75.751 kg)    Physical Exam  Constitutional: She is oriented to person, place, and time. She appears well-developed and well-nourished.  HENT:  Head: Normocephalic and atraumatic.  Neck: Neck supple.  Cardiovascular: Normal rate and regular rhythm.   Pulmonary/Chest: Effort normal and breath sounds normal.  Abdominal: Soft. Bowel sounds are normal. She exhibits no mass. There is no hepatosplenomegaly. There is no tenderness.  Musculoskeletal: She exhibits no edema.  Lymphadenopathy:    She has no cervical adenopathy.  Neurological: She is alert and oriented to person, place, and time.  Skin: Skin is warm and dry.  Psychiatric: She has a normal mood and affect. Her behavior is normal.  Vitals reviewed.   Results for orders placed or performed in visit on  11/24/15  CBC  Result Value Ref Range   WBC 8.3 4.0 - 10.5 K/uL   RBC 4.79 3.87 - 5.11 MIL/uL   Hemoglobin 13.0 12.0 - 15.0 g/dL   HCT 16.1 09.6 - 04.5 %   MCV 85.2 78.0 - 100.0 fL   MCH 27.1 26.0 - 34.0 pg   MCHC 31.9 30.0 - 36.0 g/dL   RDW 40.9 81.1 - 91.4 %   Platelets 288 150 - 400 K/uL   MPV 9.7 8.6 - 12.4 fL  Comprehensive  metabolic panel  Result Value Ref Range   Sodium 140 135 - 146 mmol/L   Potassium 4.2 3.5 - 5.3 mmol/L   Chloride 107 98 - 110 mmol/L   CO2 26 20 - 31 mmol/L   Glucose, Bld 97 65 - 99 mg/dL   BUN 7 7 - 25 mg/dL   Creat 7.82 9.56 - 2.13 mg/dL   Total Bilirubin 0.3 0.2 - 1.2 mg/dL   Alkaline Phosphatase 59 33 - 115 U/L   AST 12 10 - 30 U/L   ALT 12 6 - 29 U/L   Total Protein 6.4 6.1 - 8.1 g/dL   Albumin 3.7 3.6 - 5.1 g/dL   Calcium 9.2 8.6 - 08.6 mg/dL  Lipid panel  Result Value Ref Range   Cholesterol 167 125 - 200 mg/dL   Triglycerides 92 <578 mg/dL   HDL 47 >=46 mg/dL   Total CHOL/HDL Ratio 3.6 <=5.0 Ratio   VLDL 18 <30 mg/dL   LDL Cholesterol 962 <952 mg/dL      Assessment & Plan:   Encounter Diagnoses  Name Primary?  . Essential hypertension Yes  . Hyperlipidemia   . Abdominal pain, unspecified abdominal location   . Obesity, unspecified   . Cigarette nicotine dependence without complication     -Reviewed labs with pt -Gave [facility] discount application -discussed with pt unlikely to be gallstones but will get [place] -F/u 1 mo to recheck bp and abd pain

## 2015-11-30 DIAGNOSIS — I10 Essential (primary) hypertension: Secondary | ICD-10-CM | POA: Insufficient documentation

## 2015-11-30 DIAGNOSIS — E785 Hyperlipidemia, unspecified: Secondary | ICD-10-CM | POA: Insufficient documentation

## 2015-11-30 DIAGNOSIS — E669 Obesity, unspecified: Secondary | ICD-10-CM | POA: Insufficient documentation

## 2015-11-30 DIAGNOSIS — F17210 Nicotine dependence, cigarettes, uncomplicated: Secondary | ICD-10-CM | POA: Insufficient documentation

## 2015-11-30 DIAGNOSIS — R109 Unspecified abdominal pain: Secondary | ICD-10-CM | POA: Insufficient documentation

## 2015-12-05 ENCOUNTER — Other Ambulatory Visit (HOSPITAL_COMMUNITY): Payer: Self-pay

## 2015-12-25 ENCOUNTER — Ambulatory Visit: Payer: Self-pay | Admitting: Physician Assistant

## 2016-01-06 ENCOUNTER — Ambulatory Visit: Payer: Self-pay | Admitting: Physician Assistant

## 2016-01-06 ENCOUNTER — Encounter: Payer: Self-pay | Admitting: Physician Assistant

## 2016-01-06 VITALS — BP 136/74 | HR 79 | Temp 98.1°F | Ht 59.0 in | Wt 175.7 lb

## 2016-01-06 DIAGNOSIS — F17210 Nicotine dependence, cigarettes, uncomplicated: Secondary | ICD-10-CM

## 2016-01-06 DIAGNOSIS — E785 Hyperlipidemia, unspecified: Secondary | ICD-10-CM

## 2016-01-06 DIAGNOSIS — E669 Obesity, unspecified: Secondary | ICD-10-CM

## 2016-01-06 DIAGNOSIS — I10 Essential (primary) hypertension: Secondary | ICD-10-CM

## 2016-01-06 NOTE — Progress Notes (Signed)
   BP 136/74 mmHg  Pulse 79  Temp(Src) 98.1 F (36.7 C)  Ht 4\' 11"  (1.499 m)  Wt 175 lb 11.2 oz (79.697 kg)  BMI 35.47 kg/m2  SpO2 98%   Subjective:    Patient ID: Veronica Arnold, female    DOB: [birth year], 45 y.o.   MRN: [id number]  HPI: Veronica RichesFelicia A Novosel is a 45 y.o. female presenting on 01/06/2016 for Hypertension and Abdominal Pain   HPI   Pt is doing well. she states her abdominal pain is gone now.  Relevant past medical, surgical, family and social history reviewed and updated as indicated. Interim medical history since our last visit reviewed. Allergies and medications reviewed and updated.   Current outpatient prescriptions:  .  atenolol (TENORMIN) 25 MG tablet, Take 25 mg by mouth daily.  , Disp: , Rfl:  .  simvastatin (ZOCOR) 20 MG tablet, Take 20 mg by mouth every evening., Disp: , Rfl:    Review of Systems  Constitutional: Negative for fever, chills, diaphoresis, appetite change, fatigue and unexpected weight change.  HENT: Positive for dental problem. Negative for congestion, drooling, ear pain, facial swelling, hearing loss, mouth sores, sneezing, sore throat, trouble swallowing and voice change.   Eyes: Positive for discharge. Negative for pain, redness, itching and visual disturbance.  Respiratory: Negative for cough, choking, shortness of breath and wheezing.   Cardiovascular: Negative for chest pain, palpitations and leg swelling.  Gastrointestinal: Negative for vomiting, abdominal pain, diarrhea, constipation and blood in stool.  Endocrine: Negative for cold intolerance, heat intolerance and polydipsia.  Genitourinary: Negative for dysuria, hematuria and decreased urine volume.  Musculoskeletal: Negative for back pain, arthralgias and gait problem.  Skin: Negative for rash.  Allergic/Immunologic: Negative for environmental allergies.  Neurological: Negative for seizures, syncope, light-headedness and headaches.  Hematological: Negative for adenopathy.   Psychiatric/Behavioral: Negative for suicidal ideas, dysphoric mood and agitation. The patient is not nervous/anxious.     Per HPI unless specifically indicated above     Objective:    BP 136/74 mmHg  Pulse 79  Temp(Src) 98.1 F (36.7 C)  Ht 4\' 11"  (1.499 m)  Wt 175 lb 11.2 oz (79.697 kg)  BMI 35.47 kg/m2  SpO2 98%  Wt Readings from Last 3 Encounters:  01/06/16 175 lb 11.2 oz (79.697 kg)  11/25/15 189 lb 9.6 oz (86.002 kg)  07/20/15 167 lb (75.751 kg)    Physical Exam  Constitutional: She is oriented to person, place, and time. She appears well-developed and well-nourished.  HENT:  Head: Normocephalic and atraumatic.  Neck: Neck supple.  Cardiovascular: Normal rate and regular rhythm.   Pulmonary/Chest: Effort normal and breath sounds normal.  Abdominal: Soft. Bowel sounds are normal. She exhibits no mass. There is no hepatosplenomegaly. There is no tenderness.  Musculoskeletal: She exhibits no edema.  Lymphadenopathy:    She has no cervical adenopathy.  Neurological: She is alert and oriented to person, place, and time.  Skin: Skin is warm and dry.  Psychiatric: She has a normal mood and affect. Her behavior is normal.  Vitals reviewed.       Assessment & Plan:   Encounter Diagnoses  Name Primary?  . Essential hypertension Yes  . Hyperlipidemia   . Cigarette nicotine dependence without complication   . Obesity, unspecified     -counseled on smoking cessation -continue current medications -f/u 3 months.  RTO sooner prn

## 2016-02-26 ENCOUNTER — Other Ambulatory Visit: Payer: Self-pay | Admitting: Physician Assistant

## 2016-03-31 ENCOUNTER — Other Ambulatory Visit: Payer: Self-pay

## 2016-03-31 DIAGNOSIS — E785 Hyperlipidemia, unspecified: Secondary | ICD-10-CM

## 2016-03-31 DIAGNOSIS — I10 Essential (primary) hypertension: Secondary | ICD-10-CM

## 2016-04-07 ENCOUNTER — Ambulatory Visit: Payer: Self-pay | Admitting: Physician Assistant

## 2016-04-12 ENCOUNTER — Encounter: Payer: Self-pay | Admitting: Physician Assistant

## 2016-04-12 ENCOUNTER — Ambulatory Visit: Payer: Self-pay | Admitting: Physician Assistant

## 2016-04-12 VITALS — BP 112/76 | HR 77 | Temp 97.9°F | Ht 59.0 in | Wt 174.8 lb

## 2016-04-12 DIAGNOSIS — E669 Obesity, unspecified: Secondary | ICD-10-CM

## 2016-04-12 DIAGNOSIS — F17210 Nicotine dependence, cigarettes, uncomplicated: Secondary | ICD-10-CM

## 2016-04-12 DIAGNOSIS — I10 Essential (primary) hypertension: Secondary | ICD-10-CM

## 2016-04-12 DIAGNOSIS — E785 Hyperlipidemia, unspecified: Secondary | ICD-10-CM

## 2016-04-12 LAB — COMPLETE METABOLIC PANEL WITH GFR
ALT: 14 U/L (ref 6–29)
AST: 12 U/L (ref 10–30)
Albumin: 3.5 g/dL — ABNORMAL LOW (ref 3.6–5.1)
Alkaline Phosphatase: 83 U/L (ref 33–115)
BUN: 11 mg/dL (ref 7–25)
CALCIUM: 9.2 mg/dL (ref 8.6–10.2)
CHLORIDE: 105 mmol/L (ref 98–110)
CO2: 25 mmol/L (ref 20–31)
Creat: 0.67 mg/dL (ref 0.50–1.10)
GFR, Est Non African American: >89 mL/min (ref >=60)
Glucose, Bld: 105 mg/dL — ABNORMAL HIGH (ref 65–99)
POTASSIUM: 4.2 mmol/L (ref 3.5–5.3)
Sodium: 138 mmol/L (ref 135–146)
Total Bilirubin: 0.2 mg/dL (ref 0.2–1.2)
Total Protein: 6.1 g/dL (ref 6.1–8.1)

## 2016-04-12 LAB — LIPID PANEL
CHOL/HDL RATIO: 4.2 ratio (ref <=5.0)
CHOLESTEROL: 171 mg/dL (ref 125–200)
HDL: 41 mg/dL — ABNORMAL LOW (ref >=46)
LDL CALC: 98 mg/dL (ref <130)
TRIGLYCERIDES: 162 mg/dL — AB (ref <150)
VLDL: 32 mg/dL — AB (ref <30)

## 2016-04-12 NOTE — Progress Notes (Signed)
BP 112/76 (BP Location: Left Arm, Patient Position: Sitting, Cuff Size: Normal)   Pulse 77   Temp 97.9 F (36.6 C) (Other (Comment))   Ht 4\' 11"  (1.499 m)   Wt 174 lb 12.8 oz (79.3 kg)   LMP 04/09/2016 (Exact Date)   SpO2 98%   BMI 35.31 kg/m    Subjective:    Patient ID: Veronica Arnold, female    DOB: August 23, 1971, 45 y.o.   MRN: [id number]  HPI: Veronica Arnold is a 45 y.o. female presenting on 04/12/2016 for Hyperlipidemia and Hypertension   HPI   Pt went this a.m. To get blood drawn.  Pt [facility]  She says she is doing well  Relevant past medical, surgical, family and social history reviewed and updated as indicated. Interim medical history since our last visit reviewed. Allergies and medications reviewed and updated.  Current Outpatient Prescriptions:  .  atenolol (TENORMIN) 25 MG tablet, TAKE ONE TABLET BY MOUTH ONCE DAILY FOR  BLOOD  PRESSURE, Disp: 30 tablet, Rfl: 2 .  simvastatin (ZOCOR) 20 MG tablet, TAKE ONE TABLET BY MOUTH ONCE DAILY AT BEDTIME, Disp: 30 tablet, Rfl: 3  Review of Systems  Constitutional: Negative for appetite change, chills, diaphoresis, fatigue, fever and unexpected weight change.  HENT: Positive for dental problem. Negative for congestion, drooling, ear pain, facial swelling, hearing loss, mouth sores, sneezing, sore throat, trouble swallowing and voice change.   Eyes: Negative for pain, discharge, redness, itching and visual disturbance.  Respiratory: Negative for cough, choking, shortness of breath and wheezing.   Cardiovascular: Negative for chest pain, palpitations and leg swelling.  Gastrointestinal: Negative for abdominal pain, blood in stool, constipation, diarrhea and vomiting.  Endocrine: Negative for cold intolerance, heat intolerance and polydipsia.  Genitourinary: Negative for decreased urine volume, dysuria and hematuria.  Musculoskeletal: Negative for arthralgias, back pain and gait problem.  Skin:  Negative for rash.  Allergic/Immunologic: Negative for environmental allergies.  Neurological: Negative for seizures, syncope, light-headedness and headaches.  Hematological: Negative for adenopathy.  Psychiatric/Behavioral: Negative for agitation, dysphoric mood and suicidal ideas. The patient is not nervous/anxious.     Per HPI unless specifically indicated above     Objective:    BP 112/76 (BP Location: Left Arm, Patient Position: Sitting, Cuff Size: Normal)   Pulse 77   Temp 97.9 F (36.6 C) (Other (Comment))   Ht 4\' 11"  (1.499 m)   Wt 174 lb 12.8 oz (79.3 kg)   LMP 04/09/2016 (Exact Date)   SpO2 98%   BMI 35.31 kg/m   Wt Readings from Last 3 Encounters:  04/12/16 174 lb 12.8 oz (79.3 kg)  01/06/16 175 lb 11.2 oz (79.7 kg)  11/25/15 189 lb 9.6 oz (86 kg)    Physical Exam  Constitutional: She is oriented to person, place, and time. She appears well-developed and well-nourished.  HENT:  Head: Normocephalic and atraumatic.  Neck: Neck supple.  Cardiovascular: Normal rate and regular rhythm.   Pulmonary/Chest: Effort normal and breath sounds normal.  Abdominal: Soft. Bowel sounds are normal. She exhibits no mass. There is no hepatosplenomegaly. There is no tenderness.  Musculoskeletal: She exhibits no edema.  Lymphadenopathy:    She has no cervical adenopathy.  Neurological: She is alert and oriented to person, place, and time.  Skin: Skin is warm and dry.  Psychiatric: She has a normal mood and affect. Her behavior is normal.  Vitals reviewed.       Assessment & Plan:  Encounter Diagnoses  Name Primary?  . Essential hypertension Yes  . Hyperlipidemia   . Cigarette nicotine dependence without complication   . Obesity, unspecified      -Requested pap report from [facility] -Will call pt with results of bloodwork -continue current medications -F/u 3 months. RTO sooner prn

## 2016-05-01 ENCOUNTER — Other Ambulatory Visit: Payer: Self-pay | Admitting: Physician Assistant

## 2016-05-01 MED ORDER — METOPROLOL TARTRATE 25 MG PO TABS: 25.0000 mg | ORAL_TABLET | Freq: Two times a day (BID) | ORAL | 3 refills | Status: DC

## 2016-06-07 ENCOUNTER — Ambulatory Visit: Payer: Self-pay

## 2016-06-17 ENCOUNTER — Encounter (HOSPITAL_COMMUNITY): Payer: Self-pay | Admitting: Emergency Medicine

## 2016-06-17 ENCOUNTER — Emergency Department (HOSPITAL_COMMUNITY)
Admission: EM | Admit: 2016-06-17 | Discharge: 2016-06-17 | Disposition: A | Discharge status: Home / Self Care | Payer: Self-pay | Attending: Emergency Medicine | Admitting: Emergency Medicine

## 2016-06-17 DIAGNOSIS — Z79899 Other long term (current) drug therapy: Secondary | ICD-10-CM | POA: Insufficient documentation

## 2016-06-17 DIAGNOSIS — H6121 Impacted cerumen, right ear: Secondary | ICD-10-CM | POA: Insufficient documentation

## 2016-06-17 DIAGNOSIS — I10 Essential (primary) hypertension: Secondary | ICD-10-CM | POA: Insufficient documentation

## 2016-06-17 DIAGNOSIS — F17210 Nicotine dependence, cigarettes, uncomplicated: Secondary | ICD-10-CM | POA: Insufficient documentation

## 2016-06-17 NOTE — ED Provider Notes (Signed)
[facility]-EMERGENCY DEPT Provider Note   CSN: [id number] Arrival date & time: 06/17/16  0650     History   Chief Complaint Chief Complaint  Patient presents with  . Otalgia    HPI Veronica Arnold is a 45 y.o. female.   Otalgia  This is a new problem. The current episode started more than 2 days ago. There is pain in the right ear. The problem occurs constantly. The problem has not changed since onset.There has been no fever. The patient is experiencing no pain. Pertinent negatives include no ear discharge, no diarrhea, no vomiting and no cough. Her past medical history does not include chronic ear infection, hearing loss or tympanostomy tube.    Past Medical History:  Diagnosis Date  . High cholesterol   . Hypertension     Patient Active Problem List   Diagnosis Date Noted  . Essential hypertension 11/30/2015  . Hyperlipidemia 11/30/2015  . AP (abdominal pain) 11/30/2015  . Obesity, unspecified 11/30/2015  . Cigarette nicotine dependence without complication 11/30/2015    Past Surgical History:  Procedure Laterality Date  . HERNIA REPAIR    . TUBAL LIGATION    . TUBAL LIGATION      OB History    Gravida Para Term Preterm AB Living   3 3 3     3    SAB TAB Ectopic Multiple Live Births                   Home Medications    Prior to Admission medications   Medication Sig Start Date End Date Taking? Authorizing Provider  atenolol (TENORMIN) 25 MG tablet TAKE ONE TABLET BY MOUTH ONCE DAILY FOR  BLOOD  PRESSURE 02/27/16   Jacquelin HawkingShannon McElroy, PA-C  metoprolol tartrate (LOPRESSOR) 25 MG tablet Take 1 tablet (25 mg total) by mouth 2 (two) times daily. 05/01/16   Jacquelin HawkingShannon McElroy, PA-C  simvastatin (ZOCOR) 20 MG tablet TAKE ONE TABLET BY MOUTH ONCE DAILY AT BEDTIME 02/27/16   Jacquelin HawkingShannon McElroy, PA-C    Family History Family History  Problem Relation Age of Onset  . [name]   . Stroke Brother   . Heart attack Brother     Social History Social History  Substance  Use Topics  . Smoking status: Current Every Day Smoker    Packs/day: 0.50    Years: 25.00    Types: Cigarettes  . Smokeless tobacco: Never Used  . Alcohol use No     Allergies   Review of patient's allergies indicates no known allergies.   Review of Systems Review of Systems  HENT: Positive for ear pain (fullness and decreased hearing). Negative for ear discharge.   Respiratory: Negative for cough.   Gastrointestinal: Negative for diarrhea and vomiting.  All other systems reviewed and are negative.    Physical Exam Updated Vital Signs BP 145/87 (BP Location: Left Arm)   Pulse 60   Temp 98.1 F (36.7 C) (Oral)   Resp 16   Ht 4\' 11"  (1.499 m)   Wt 180 lb (81.6 kg)   LMP 06/10/2016   SpO2 100%   BMI 36.36 kg/m   Physical Exam  Constitutional: She is oriented to person, place, and time. She appears well-developed and well-nourished.  HENT:  Head: Normocephalic and atraumatic.  Left Ear: Hearing normal.  R ear with cerumen impaction L ear normal  Eyes: Conjunctivae and EOM are normal.  Neck: Normal range of motion.  Cardiovascular: Normal rate and regular rhythm.   Pulmonary/Chest: Effort  normal. No stridor. No respiratory distress.  Abdominal: She exhibits no distension.  Musculoskeletal: Normal range of motion. She exhibits no edema or deformity.  Neurological: She is alert and oriented to person, place, and time. No cranial nerve deficit.  Nursing note and vitals reviewed.    ED Treatments / Results  Labs (all labs ordered are listed, but only abnormal results are displayed) Labs Reviewed - No data to display  EKG  EKG Interpretation None       Radiology No results found.  Procedures Procedures (including critical care time)  Medications Ordered in ED Medications - No data to display   Initial Impression / Assessment and Plan / ED Course  I have reviewed the triage vital signs and the nursing notes.  Pertinent labs & imaging results that  were available during my care of the patient were reviewed by me and considered in my medical decision making (see chart for details).  Clinical Course    Improved hearing after cerumen impaction cleared by nursing. Doubt central or neurologic cause for symptoms. No e/o infection.   Final Clinical Impressions(s) / ED Diagnoses   Final diagnoses:  Impacted cerumen of right ear    New Prescriptions New Prescriptions   No medications on file     Marily Memos, MD 06/17/16 [phone]

## 2016-06-17 NOTE — ED Triage Notes (Signed)
PT c/o right ear pressure and decreased hearing x3 days. PT denies any other complaints and denies any nasal congestion or fevers.

## 2016-06-17 NOTE — ED Notes (Signed)
Large amount of cerumen removed from right ear.  TM clear and intact.

## 2016-07-06 ENCOUNTER — Other Ambulatory Visit: Payer: Self-pay

## 2016-07-06 DIAGNOSIS — I10 Essential (primary) hypertension: Secondary | ICD-10-CM

## 2016-07-06 DIAGNOSIS — E785 Hyperlipidemia, unspecified: Secondary | ICD-10-CM

## 2016-07-12 LAB — LIPID PANEL
CHOL/HDL RATIO: 4.6 ratio (ref <5.0)
CHOLESTEROL: 216 mg/dL — AB (ref <200)
HDL: 47 mg/dL — ABNORMAL LOW (ref >50)
LDL CALC: 156 mg/dL — AB
Triglycerides: 67 mg/dL (ref <150)
VLDL: 13 mg/dL (ref <30)

## 2016-07-13 ENCOUNTER — Encounter: Payer: Self-pay | Admitting: Physician Assistant

## 2016-07-13 ENCOUNTER — Ambulatory Visit: Payer: Self-pay | Admitting: Physician Assistant

## 2016-07-13 VITALS — BP 126/82 | HR 74 | Temp 97.7°F | Ht 59.0 in | Wt 178.8 lb

## 2016-07-13 DIAGNOSIS — E785 Hyperlipidemia, unspecified: Secondary | ICD-10-CM

## 2016-07-13 DIAGNOSIS — F17210 Nicotine dependence, cigarettes, uncomplicated: Secondary | ICD-10-CM

## 2016-07-13 DIAGNOSIS — H9201 Otalgia, right ear: Secondary | ICD-10-CM

## 2016-07-13 DIAGNOSIS — I10 Essential (primary) hypertension: Secondary | ICD-10-CM

## 2016-07-13 LAB — COMPLETE METABOLIC PANEL WITH GFR
ALT: 13 U/L (ref 6–29)
AST: 14 U/L (ref 10–30)
Albumin: 3.8 g/dL (ref 3.6–5.1)
Alkaline Phosphatase: 70 U/L (ref 33–115)
BILIRUBIN TOTAL: 0.3 mg/dL (ref 0.2–1.2)
BUN: 7 mg/dL (ref 7–25)
CALCIUM: 9.5 mg/dL (ref 8.6–10.2)
CHLORIDE: 107 mmol/L (ref 98–110)
CO2: 30 mmol/L (ref 20–31)
CREATININE: 0.65 mg/dL (ref 0.50–1.10)
GFR, Est Non African American: >89 mL/min (ref >=60)
Glucose, Bld: 106 mg/dL — ABNORMAL HIGH (ref 65–99)
Potassium: 4.4 mmol/L (ref 3.5–5.3)
Sodium: 138 mmol/L (ref 135–146)
TOTAL PROTEIN: 6.5 g/dL (ref 6.1–8.1)

## 2016-07-13 MED ORDER — ATORVASTATIN CALCIUM 20 MG PO TABS: 20.0000 mg | ORAL_TABLET | Freq: Every day | ORAL | 2 refills | Status: DC

## 2016-07-13 NOTE — Progress Notes (Signed)
BP 126/82 (BP Location: Left Arm, Patient Position: Sitting, Cuff Size: Normal)   Pulse 74   Temp 97.7 F (36.5 C) (Other (Comment))   Ht 4\' 11"  (1.499 m)   Wt 178 lb 12.8 oz (81.1 kg)   LMP 06/15/2016 (Exact Date)   SpO2 98%   BMI 36.11 kg/m    Subjective:    Patient ID: Veronica Arnold, female    DOB: 1970/11/30, 45 y.o.   MRN: [id number]  HPI: Veronica Arnold is a 45 y.o. female presenting on 07/13/2016 for Hypertension; Hyperlipidemia; and Ear Pain (right ear feels stopped up, [facility], still feels stopped up)   HPI   Pt feeling well except her ear is sore.  After she went to the ER, she has been using drops in it and "cleaning it out"  She stopped her simvastatin about a month ago due to cost  Relevant past medical, surgical, family and social history reviewed and updated as indicated. Interim medical history since our last visit reviewed. Allergies and medications reviewed and updated.   Current Outpatient Prescriptions:  .  metoprolol tartrate (LOPRESSOR) 25 MG tablet, Take 1 tablet (25 mg total) by mouth 2 (two) times daily., Disp: 60 tablet, Rfl: 3 .  simvastatin (ZOCOR) 20 MG tablet, TAKE ONE TABLET BY MOUTH ONCE DAILY AT BEDTIME (Patient not taking: Reported on 07/13/2016), Disp: 30 tablet, Rfl: 3   Review of Systems  Constitutional: Negative for appetite change, chills, diaphoresis, fatigue, fever and unexpected weight change.  HENT: Positive for ear pain. Negative for congestion, dental problem, drooling, facial swelling, hearing loss, mouth sores, sneezing, sore throat, trouble swallowing and voice change.   Eyes: Negative for pain, discharge, redness, itching and visual disturbance.  Respiratory: Negative for cough, choking, shortness of breath and wheezing.   Cardiovascular: Negative for chest pain, palpitations and leg swelling.  Gastrointestinal: Negative for abdominal pain, blood in stool, constipation, diarrhea and vomiting.   Endocrine: Negative for cold intolerance, heat intolerance and polydipsia.  Genitourinary: Negative for decreased urine volume, dysuria and hematuria.  Musculoskeletal: Positive for back pain. Negative for arthralgias and gait problem.  Skin: Negative for rash.  Allergic/Immunologic: Negative for environmental allergies.  Neurological: Negative for seizures, syncope, light-headedness and headaches.  Hematological: Negative for adenopathy.  Psychiatric/Behavioral: Negative for agitation, dysphoric mood and suicidal ideas. The patient is not nervous/anxious.     Per HPI unless specifically indicated above     Objective:    BP 126/82 (BP Location: Left Arm, Patient Position: Sitting, Cuff Size: Normal)   Pulse 74   Temp 97.7 F (36.5 C) (Other (Comment))   Ht 4\' 11"  (1.499 m)   Wt 178 lb 12.8 oz (81.1 kg)   LMP 06/15/2016 (Exact Date)   SpO2 98%   BMI 36.11 kg/m   Wt Readings from Last 3 Encounters:  07/13/16 178 lb 12.8 oz (81.1 kg)  06/17/16 180 lb (81.6 kg)  04/12/16 174 lb 12.8 oz (79.3 kg)    Physical Exam  Constitutional: She is oriented to person, place, and time. She appears well-developed and well-nourished.  HENT:  Head: Normocephalic and atraumatic.  Right Ear: Hearing, tympanic membrane and ear canal normal. There is tenderness.  Left Ear: Hearing, tympanic membrane, external ear and ear canal normal.  Nose: Nose normal.  Mouth/Throat: Uvula is midline and oropharynx is clear and moist. No oropharyngeal exudate.  Mild tenderness R external ear  Neck: Neck supple.  Cardiovascular: Normal rate and  regular rhythm.   Pulmonary/Chest: Effort normal and breath sounds normal. She has no wheezes.  Abdominal: Soft. Bowel sounds are normal. She exhibits no mass. There is no hepatosplenomegaly. There is no tenderness.  Musculoskeletal: She exhibits no edema.  Lymphadenopathy:    She has no cervical adenopathy.  Neurological: She is alert and oriented to person, place,  and time.  Skin: Skin is warm and dry.  Psychiatric: She has a normal mood and affect. Her behavior is normal.  Vitals reviewed.   Results for orders placed or performed in visit on 07/06/16  COMPLETE METABOLIC PANEL WITH GFR  Result Value Ref Range   Sodium 138 135 - 146 mmol/L   Potassium 4.4 3.5 - 5.3 mmol/L   Chloride 107 98 - 110 mmol/L   CO2 30 20 - 31 mmol/L   Glucose, Bld 106 (H) 65 - 99 mg/dL   BUN 7 7 - 25 mg/dL   Creat 0.980.65 1.190.50 - 1.471.10 mg/dL   Total Bilirubin 0.3 0.2 - 1.2 mg/dL   Alkaline Phosphatase 70 33 - 115 U/L   AST 14 10 - 30 U/L   ALT 13 6 - 29 U/L   Total Protein 6.5 6.1 - 8.1 g/dL   Albumin 3.8 3.6 - 5.1 g/dL   Calcium 9.5 8.6 - 82.910.2 mg/dL   GFR, Est African American >89 >=60 mL/min   GFR, Est Non African American >89 >=60 mL/min  Lipid Profile  Result Value Ref Range   Cholesterol 216 (H) <200 mg/dL   Triglycerides 67 <562<150 mg/dL   HDL 47 (L) >13>50 mg/dL   Total CHOL/HDL Ratio 4.6 <5.0 Ratio   VLDL 13 <30 mg/dL   LDL Cholesterol 086156 (H) mg/dL      Assessment & Plan:   Encounter Diagnoses  Name Primary?  . Essential hypertension Yes  . Hyperlipidemia, unspecified hyperlipidemia type   . Cigarette nicotine dependence without complication   . Right ear pain     -reviewed labs with pt -Change to lipitor due to cost. Pt signed up for [facility] -encouraged pt to stop messing with her ear.  There is no cerumen in it now that she needs to get out -counseled smoking cessation -F/u 3 months with PAP at that appointment.  RTO sooner prn

## 2016-08-11 ENCOUNTER — Encounter (HOSPITAL_COMMUNITY): Payer: Self-pay | Admitting: Emergency Medicine

## 2016-08-11 ENCOUNTER — Emergency Department (HOSPITAL_COMMUNITY)
Admission: EM | Admit: 2016-08-11 | Discharge: 2016-08-11 | Disposition: A | Discharge status: Home / Self Care | Payer: Self-pay | Attending: Emergency Medicine | Admitting: Emergency Medicine

## 2016-08-11 DIAGNOSIS — Z79899 Other long term (current) drug therapy: Secondary | ICD-10-CM | POA: Insufficient documentation

## 2016-08-11 DIAGNOSIS — F17210 Nicotine dependence, cigarettes, uncomplicated: Secondary | ICD-10-CM | POA: Insufficient documentation

## 2016-08-11 DIAGNOSIS — G5603 Carpal tunnel syndrome, bilateral upper limbs: Secondary | ICD-10-CM | POA: Insufficient documentation

## 2016-08-11 DIAGNOSIS — I10 Essential (primary) hypertension: Secondary | ICD-10-CM | POA: Insufficient documentation

## 2016-08-11 MED ORDER — NAPROXEN 500 MG PO TABS: 500.0000 mg | ORAL_TABLET | Freq: Two times a day (BID) | ORAL | 0 refills | Status: DC

## 2016-08-11 NOTE — ED Triage Notes (Signed)
Having hand pain (numbness and tingling) for about one week.  Rates pain 8/10.  Have not taken any pain medication.  Denies any injury.

## 2016-08-11 NOTE — ED Provider Notes (Signed)
[facility] DEPT Provider Note   CSN: [id number] Arrival date & time: 08/11/16  1013     History   Chief Complaint Chief Complaint  Patient presents with  . Hand Pain    both    HPI Veronica Arnold is a 45 y.o. female presenting with a one week history of bilateral finger throbbing pain preceded by numbness occurring about 45 minutes after she falls asleep at night.  She denies daytime symptoms and denies any prior history of similar symptoms.  She does have bilateral trigger finger in her thumbs but has not obtained definitive treatment for this condition.  She denies weakness in her arms and hands.  She denies upper extremity swelling, elbow, shoulder or neck pain or injury.  She shakes her hands for several minutes and the symptom will gradually resolve.  The history is provided by the patient.    Past Medical History:  Diagnosis Date  . High cholesterol   . Hypertension     Patient Active Problem List   Diagnosis Date Noted  . Essential hypertension 11/30/2015  . Hyperlipidemia 11/30/2015  . AP (abdominal pain) 11/30/2015  . Obesity, unspecified 11/30/2015  . Cigarette nicotine dependence without complication 11/30/2015    Past Surgical History:  Procedure Laterality Date  . HERNIA REPAIR    . TUBAL LIGATION    . TUBAL LIGATION      OB History    Gravida Para Term Preterm AB Living   3 3 3     3    SAB TAB Ectopic Multiple Live Births                   Home Medications    Prior to Admission medications   Medication Sig Start Date End Date Taking? Authorizing Provider  atorvastatin (LIPITOR) 20 MG tablet Take 1 tablet (20 mg total) by mouth daily. 07/13/16   Jacquelin HawkingShannon McElroy, PA-C  metoprolol tartrate (LOPRESSOR) 25 MG tablet Take 1 tablet (25 mg total) by mouth 2 (two) times daily. 05/01/16   Jacquelin HawkingShannon McElroy, PA-C  naproxen (NAPROSYN) 500 MG tablet Take 1 tablet (500 mg total) by mouth 2 (two) times daily. 08/11/16   Burgess AmorJulie Deloria Brassfield, PA-C  simvastatin (ZOCOR)  20 MG tablet TAKE ONE TABLET BY MOUTH ONCE DAILY AT BEDTIME Patient not taking: Reported on 07/13/2016 02/27/16   Jacquelin HawkingShannon McElroy, PA-C    Family History Family History  Problem Relation Age of Onset  . [name]   . Stroke Brother   . Heart attack Brother     Social History Social History  Substance Use Topics  . Smoking status: Current Every Day Smoker    Packs/day: 0.50    Years: 25.00    Types: Cigarettes  . Smokeless tobacco: Never Used     Comment: cut back to < 1/2 ppd  . Alcohol use No     Allergies   Patient has no known allergies.   Review of Systems Review of Systems  Constitutional: Negative for fever.  Respiratory: Negative.   Cardiovascular: Negative.   Gastrointestinal: Negative.   Musculoskeletal: Positive for arthralgias. Negative for joint swelling, myalgias, neck pain and neck stiffness.  Neurological: Positive for numbness. Negative for weakness.     Physical Exam Updated Vital Signs BP 146/87 (BP Location: Left Arm)   Pulse 68   Temp 98.9 F (37.2 C) (Oral)   Resp 18   Ht 4\' 11"  (1.499 m)   Wt 77.1 kg   LMP 07/16/2016   SpO2 96%  BMI 34.34 kg/m   Physical Exam  Constitutional: She appears well-developed and well-nourished.  HENT:  Head: Atraumatic.  Neck: Normal range of motion.  Cardiovascular:  Pulses:      Radial pulses are 2+ on the right side, and 2+ on the left side.  Pulses equal bilaterally.  Less than 2 sec cap refill in fingertips.  Musculoskeletal: She exhibits no tenderness.       Right wrist: She exhibits normal range of motion, no tenderness and no swelling.       Left wrist: She exhibits normal range of motion, no tenderness and no swelling.  Positive Tinels sign bilaterally.   Neurological: She is alert. She has normal strength. She displays normal reflexes. No sensory deficit.  Reflex Scores:      Bicep reflexes are 2+ on the right side and 2+ on the left side. Equal grip strength.    Skin: Skin is warm  and dry.  Psychiatric: She has a normal mood and affect.     ED Treatments / Results  Labs (all labs ordered are listed, but only abnormal results are displayed) Labs Reviewed - No data to display  EKG  EKG Interpretation None       Radiology No results found.  Procedures Procedures (including critical care time)  Medications Ordered in ED Medications - No data to display   Initial Impression / Assessment and Plan / ED Course  I have reviewed the triage vital signs and the nursing notes.  Pertinent labs & imaging results that were available during my care of the patient were reviewed by me and considered in my medical decision making (see chart for details).  Clinical Course     Exam and sx suggestive of carpal tunnel syndrome.  She was placed in bilateral wrist splints for night time use.  Naproxen bid.  Plan f/u with pcp for any persistent or worsened sx.   The patient appears reasonably screened and/or stabilized for discharge and I doubt any other medical condition or other [facility] requiring further screening, evaluation, or treatment in the ED at this time prior to discharge.   Final Clinical Impressions(s) / ED Diagnoses   Final diagnoses:  Bilateral carpal tunnel syndrome    New Prescriptions New Prescriptions   NAPROXEN (NAPROSYN) 500 MG TABLET    Take 1 tablet (500 mg total) by mouth 2 (two) times daily.     Burgess AmorJulie Tysha Grismore, PA-C 08/11/16 1104    Marily MemosJason Mesner, MD 08/11/16 [id number]

## 2016-10-04 ENCOUNTER — Other Ambulatory Visit: Payer: Self-pay

## 2016-10-04 DIAGNOSIS — I10 Essential (primary) hypertension: Secondary | ICD-10-CM

## 2016-10-04 DIAGNOSIS — E785 Hyperlipidemia, unspecified: Secondary | ICD-10-CM

## 2016-10-08 LAB — COMPREHENSIVE METABOLIC PANEL
ALT: 12 U/L (ref 6–29)
AST: 12 U/L (ref 10–30)
Albumin: 4 g/dL (ref 3.6–5.1)
Alkaline Phosphatase: 64 U/L (ref 33–115)
BILIRUBIN TOTAL: 0.5 mg/dL (ref 0.2–1.2)
BUN: 12 mg/dL (ref 7–25)
CO2: 28 mmol/L (ref 20–31)
CREATININE: 0.76 mg/dL (ref 0.50–1.10)
Calcium: 9.3 mg/dL (ref 8.6–10.2)
Chloride: 108 mmol/L (ref 98–110)
GLUCOSE: 90 mg/dL (ref 65–99)
Potassium: 4.3 mmol/L (ref 3.5–5.3)
SODIUM: 139 mmol/L (ref 135–146)
Total Protein: 7.1 g/dL (ref 6.1–8.1)

## 2016-10-08 LAB — LIPID PANEL
CHOL/HDL RATIO: 4.4 ratio (ref <5.0)
Cholesterol: 201 mg/dL — ABNORMAL HIGH (ref <200)
HDL: 46 mg/dL — ABNORMAL LOW (ref >50)
LDL CALC: 140 mg/dL — AB (ref <100)
Triglycerides: 73 mg/dL (ref <150)
VLDL: 15 mg/dL (ref <30)

## 2016-10-13 ENCOUNTER — Encounter: Payer: Self-pay | Admitting: Physician Assistant

## 2016-10-13 ENCOUNTER — Ambulatory Visit: Payer: Self-pay | Admitting: Physician Assistant

## 2016-10-13 VITALS — BP 138/84 | HR 68 | Temp 97.7°F | Ht 59.0 in | Wt 187.0 lb

## 2016-10-13 DIAGNOSIS — E785 Hyperlipidemia, unspecified: Secondary | ICD-10-CM

## 2016-10-13 DIAGNOSIS — G5603 Carpal tunnel syndrome, bilateral upper limbs: Secondary | ICD-10-CM

## 2016-10-13 DIAGNOSIS — F17210 Nicotine dependence, cigarettes, uncomplicated: Secondary | ICD-10-CM

## 2016-10-13 DIAGNOSIS — I10 Essential (primary) hypertension: Secondary | ICD-10-CM

## 2016-10-13 MED ORDER — DICLOFENAC SODIUM 75 MG PO TBEC: 75.0000 mg | DELAYED_RELEASE_TABLET | Freq: Two times a day (BID) | ORAL | 0 refills | Status: DC

## 2016-10-13 NOTE — Progress Notes (Signed)
BP 138/84 (BP Location: Left Arm, Patient Position: Sitting, Cuff Size: Large)   Pulse 68   Temp 97.7 F (36.5 C) (Other (Comment))   Ht 4\' 11"  (1.499 m)   Wt 187 lb (84.8 kg)   LMP 09/09/2016 (Approximate)   SpO2 98%   BMI 37.77 kg/m    Subjective:    Patient ID: Veronica Arnold, female    DOB: 10-27-1970, 46 y.o.   MRN: [id number]  HPI: Veronica Arnold is a 46 y.o. female presenting on 10/13/2016 for Gynecologic Exam and Hyperlipidemia   HPI   Pt not taking any meds except for metoprolol.   She hasn't gotten meds from [facility] yet.  Nurse called and was told It was sent 10/06/16.    LMP 09/09/16  Pt here today for htn, chol, PAP but says would rather get seen for her carpal tunnel because "it is really bad".    Pt c/o carpal tunnel is really bad.  It has been hurting for over a month.  She was originally diagnosed maybe end of 2017. Pt has been seen in ER for same.      Relevant past medical, surgical, family and social history reviewed and updated as indicated. Interim medical history since our last visit reviewed. Allergies and medications reviewed and updated.   Current Outpatient Prescriptions:  .  metoprolol tartrate (LOPRESSOR) 25 MG tablet, Take 1 tablet (25 mg total) by mouth 2 (two) times daily., Disp: 60 tablet, Rfl: 3 .  atorvastatin (LIPITOR) 20 MG tablet, Take 1 tablet (20 mg total) by mouth daily. (Patient not taking: Reported on 10/13/2016), Disp: 90 tablet, Rfl: 2 .  naproxen (NAPROSYN) 500 MG tablet, Take 1 tablet (500 mg total) by mouth 2 (two) times daily. (Patient not taking: Reported on 10/13/2016), Disp: 30 tablet, Rfl: 0 .  simvastatin (ZOCOR) 20 MG tablet, TAKE ONE TABLET BY MOUTH ONCE DAILY AT BEDTIME (Patient not taking: Reported on 07/13/2016), Disp: 30 tablet, Rfl: 3   Review of Systems  Constitutional: Negative for appetite change, chills, diaphoresis, fatigue, fever and unexpected weight change.  HENT: Positive for dental problem and ear pain.  Negative for congestion, drooling, facial swelling, hearing loss, mouth sores, sneezing, sore throat, trouble swallowing and voice change.   Eyes: Positive for discharge and itching. Negative for pain, redness and visual disturbance.  Respiratory: Positive for shortness of breath. Negative for cough, choking and wheezing.   Cardiovascular: Negative for chest pain, palpitations and leg swelling.  Gastrointestinal: Negative for abdominal pain, blood in stool, constipation, diarrhea and vomiting.  Endocrine: Negative for cold intolerance, heat intolerance and polydipsia.  Genitourinary: Negative for decreased urine volume, dysuria and hematuria.  Musculoskeletal: Positive for arthralgias and back pain. Negative for gait problem.  Skin: Negative for rash.  Allergic/Immunologic: Negative for environmental allergies.  Neurological: Negative for seizures, syncope, light-headedness and headaches.  Hematological: Negative for adenopathy.  Psychiatric/Behavioral: Negative for agitation, dysphoric mood and suicidal ideas. The patient is not nervous/anxious.     Per HPI unless specifically indicated above     Objective:    BP 138/84 (BP Location: Left Arm, Patient Position: Sitting, Cuff Size: Large)   Pulse 68   Temp 97.7 F (36.5 C) (Other (Comment))   Ht 4\' 11"  (1.499 m)   Wt 187 lb (84.8 kg)   LMP 09/09/2016 (Approximate)   SpO2 98%   BMI 37.77 kg/m   Wt Readings from Last 3 Encounters:  10/13/16 187 lb (84.8 kg)  08/11/16 170 lb (77.1  kg)  07/13/16 178 lb 12.8 oz (81.1 kg)    Physical Exam  Constitutional: She is oriented to person, place, and time. She appears well-developed and well-nourished.  HENT:  Head: Normocephalic and atraumatic.  Neck: Neck supple.  Cardiovascular: Normal rate and regular rhythm.   Pulmonary/Chest: Effort normal and breath sounds normal.  Abdominal: Soft. Bowel sounds are normal. She exhibits no mass. There is no hepatosplenomegaly. There is no  tenderness.  Musculoskeletal: She exhibits no edema.       Right wrist: She exhibits decreased range of motion, tenderness and swelling. She exhibits no bony tenderness and no effusion.       Left wrist: She exhibits tenderness. She exhibits normal range of motion, no bony tenderness and no swelling.  Lymphadenopathy:    She has no cervical adenopathy.  Neurological: She is alert and oriented to person, place, and time.  Skin: Skin is warm and dry.  Psychiatric: She has a normal mood and affect. Her behavior is normal.  Vitals reviewed.   Results for orders placed or performed in visit on 10/04/16  Lipid Profile  Result Value Ref Range   Cholesterol 201 (H) <200 mg/dL   Triglycerides 73 <914<150 mg/dL   HDL 46 (L) >78>50 mg/dL   Total CHOL/HDL Ratio 4.4 <5.0 Ratio   VLDL 15 <30 mg/dL   LDL Cholesterol 295140 (H) <100 mg/dL  Comprehensive metabolic panel  Result Value Ref Range   Sodium 139 135 - 146 mmol/L   Potassium 4.3 3.5 - 5.3 mmol/L   Chloride 108 98 - 110 mmol/L   CO2 28 20 - 31 mmol/L   Glucose, Bld 90 65 - 99 mg/dL   BUN 12 7 - 25 mg/dL   Creat 6.210.76 3.080.50 - 6.571.10 mg/dL   Total Bilirubin 0.5 0.2 - 1.2 mg/dL   Alkaline Phosphatase 64 33 - 115 U/L   AST 12 10 - 30 U/L   ALT 12 6 - 29 U/L   Total Protein 7.1 6.1 - 8.1 g/dL   Albumin 4.0 3.6 - 5.1 g/dL   Calcium 9.3 8.6 - 84.610.2 mg/dL      Assessment & Plan:    Encounter Diagnoses  Name Primary?  . Essential hypertension Yes  . Hyperlipidemia, unspecified hyperlipidemia type   . Cigarette nicotine dependence without complication   . Bilateral carpal tunnel syndrome     -reviewed labs with pt -rx diclofenac -pt to Continue to wear splint -pt to ICE wrists 15-20 minutes 3 times day -Gave [facility] application -pt to continue metoprolol and start the atorvastatin when it arrives in the mail -F/u 1 month for pap and make sure has orthopedics appointment scheduled

## 2016-10-13 NOTE — Patient Instructions (Signed)
Carpal Tunnel Syndrome Introduction Carpal tunnel syndrome is a condition that causes pain in your hand and arm. The carpal tunnel is a narrow area that is on the palm side of your wrist. Repeated wrist motion or certain diseases may cause swelling in the tunnel. This swelling can pinch the main nerve in the wrist (median nerve). Follow these instructions at home: If you have a splint:  Wear it as told by your doctor. Remove it only as told by your doctor.  Loosen the splint if your fingers:  Become numb and tingle.  Turn blue and cold.  Keep the splint clean and dry. General instructions  Take over-the-counter and prescription medicines only as told by your doctor.  Rest your wrist from any activity that may be causing your pain. If needed, talk to your employer about changes that can be made in your work, such as getting a wrist pad to use while typing.  If directed, apply ice to the painful area:  Put ice in a plastic bag.  Place a towel between your skin and the bag.  Leave the ice on for 20 minutes, 2-3 times per day.  Keep all follow-up visits as told by your doctor. This is important.  Do any exercises as told by your doctor, physical therapist, or occupational therapist. Contact a doctor if:  You have new symptoms.  Medicine does not help your pain.  Your symptoms get worse. This information is not intended to replace advice given to you by your health care provider. Make sure you discuss any questions you have with your health care provider. Document Released: 08/12/2011 Document Revised: 01/29/2016 Document Reviewed: 01/08/2015  2017 [employer]  

## 2016-11-10 ENCOUNTER — Ambulatory Visit: Payer: Self-pay | Admitting: Physician Assistant

## 2016-11-15 ENCOUNTER — Encounter: Payer: Self-pay | Admitting: Physician Assistant

## 2016-11-23 ENCOUNTER — Ambulatory Visit: Payer: Self-pay | Admitting: Physician Assistant

## 2016-11-23 ENCOUNTER — Encounter: Payer: Self-pay | Admitting: Physician Assistant

## 2016-11-23 VITALS — BP 146/84 | HR 65 | Temp 97.5°F | Ht 59.0 in | Wt 183.0 lb

## 2016-11-23 DIAGNOSIS — E785 Hyperlipidemia, unspecified: Secondary | ICD-10-CM

## 2016-11-23 DIAGNOSIS — I10 Essential (primary) hypertension: Secondary | ICD-10-CM

## 2016-11-23 MED ORDER — SIMVASTATIN 20 MG PO TABS: 20.0000 mg | ORAL_TABLET | Freq: Every day | ORAL | 3 refills | Status: DC

## 2016-11-23 NOTE — Patient Instructions (Addendum)
Turn in [facility] application  Make sure to take your blood pressure medication before your appointment  If you get the atorvastatin in the mail, finish your simvastatin before you start the atorvastatin.

## 2016-11-23 NOTE — Progress Notes (Signed)
BP (!) 146/84 (BP Location: Left Arm, Patient Position: Sitting, Cuff Size: Large)   Pulse 65   Temp 97.5 F (36.4 C) (Other (Comment))   Ht 4\' 11"  (1.499 m)   Wt 183 lb (83 kg)   LMP 11/21/2016 (Exact Date)   SpO2 99%   BMI 36.96 kg/m    Subjective:    Patient ID: Veronica Arnold, female    DOB: 07/03/1971, 46 y.o.   MRN: [id number]  HPI: Veronica Arnold is a 46 y.o. female presenting on 11/23/2016 for Carpal Tunnel   HPI   Pt has filled out [facility] application but hasn't tuned it in yet.  She is waiting for paper from social security office  Pt says she didn't take her bp med yet today  Pt says she still hasn't gotten meds from [facility] yet. Nurse called on 2/7 and was told it was sent 1/31.    Pt says she is on her menstrual cycle so can't do her PAP today  Relevant past medical, surgical, family and social history reviewed and updated as indicated. Interim medical history since our last visit reviewed. Allergies and medications reviewed and updated.   Current Outpatient Prescriptions:  .  diclofenac (VOLTAREN) 75 MG EC tablet, Take 1 tablet (75 mg total) by mouth 2 (two) times daily., Disp: 60 tablet, Rfl: 0 .  metoprolol tartrate (LOPRESSOR) 25 MG tablet, Take 1 tablet (25 mg total) by mouth 2 (two) times daily., Disp: 60 tablet, Rfl: 3 .  atorvastatin (LIPITOR) 20 MG tablet, Take 1 tablet (20 mg total) by mouth daily. (Patient not taking: Reported on 11/23/2016), Disp: 90 tablet, Rfl: 2 .  naproxen (NAPROSYN) 500 MG tablet, Take 1 tablet (500 mg total) by mouth 2 (two) times daily. (Patient not taking: Reported on 10/13/2016), Disp: 30 tablet, Rfl: 0 .  simvastatin (ZOCOR) 20 MG tablet, TAKE ONE TABLET BY MOUTH ONCE DAILY AT BEDTIME (Patient not taking: Reported on 07/13/2016), Disp: 30 tablet, Rfl: 3   Review of Systems  Constitutional: Negative for appetite change, chills, diaphoresis, fatigue, fever and unexpected weight change.  HENT: Positive for dental  problem. Negative for congestion, drooling, ear pain, facial swelling, hearing loss, mouth sores, sneezing, sore throat, trouble swallowing and voice change.   Eyes: Positive for discharge and itching. Negative for pain, redness and visual disturbance.  Respiratory: Negative for cough, choking, shortness of breath and wheezing.   Cardiovascular: Negative for chest pain, palpitations and leg swelling.  Gastrointestinal: Negative for abdominal pain, blood in stool, constipation, diarrhea and vomiting.  Endocrine: Negative for cold intolerance, heat intolerance and polydipsia.  Genitourinary: Negative for decreased urine volume, dysuria and hematuria.  Musculoskeletal: Negative for arthralgias, back pain and gait problem.  Skin: Negative for rash.  Allergic/Immunologic: Negative for environmental allergies.  Neurological: Negative for seizures, syncope, light-headedness and headaches.  Hematological: Negative for adenopathy.  Psychiatric/Behavioral: Negative for agitation, dysphoric mood and suicidal ideas. The patient is not nervous/anxious.     Per HPI unless specifically indicated above     Objective:    BP (!) 146/84 (BP Location: Left Arm, Patient Position: Sitting, Cuff Size: Large)   Pulse 65   Temp 97.5 F (36.4 C) (Other (Comment))   Ht 4\' 11"  (1.499 m)   Wt 183 lb (83 kg)   LMP 11/21/2016 (Exact Date)   SpO2 99%   BMI 36.96 kg/m   Wt Readings from Last 3 Encounters:  11/23/16 183 lb (83 kg)  10/13/16 187 lb (84.8  kg)  08/11/16 170 lb (77.1 kg)    Physical Exam  Constitutional: She is oriented to person, place, and time. She appears well-developed and well-nourished.  HENT:  Head: Normocephalic and atraumatic.  Neck: Neck supple.  Cardiovascular: Normal rate and regular rhythm.   Pulmonary/Chest: Effort normal and breath sounds normal.  Abdominal: Soft. Bowel sounds are normal. She exhibits no mass. There is no hepatosplenomegaly. There is no tenderness.   Musculoskeletal: She exhibits no edema.  Lymphadenopathy:    She has no cervical adenopathy.  Neurological: She is alert and oriented to person, place, and time.  Skin: Skin is warm and dry.  Psychiatric: She has a normal mood and affect. Her behavior is normal.  Vitals reviewed.       Assessment & Plan:    Encounter Diagnoses  Name Primary?  . Essential hypertension Yes  . Hyperlipidemia, unspecified hyperlipidemia type      -pt renewed with [facility] -pt will continue with simvastatin until she starts getting the atorvastatin from [facility].  She was reminded to avoid taking both -pt to get [facility] application turned in as soon as possible -pt to follow up for PAP in 6 weeks.  She is counseled to make sure she takes her bp med before she comes in for her appointment

## 2017-01-05 ENCOUNTER — Ambulatory Visit: Payer: Self-pay | Admitting: Physician Assistant

## 2017-01-06 ENCOUNTER — Encounter: Payer: Self-pay | Admitting: Physician Assistant

## 2017-02-02 ENCOUNTER — Other Ambulatory Visit (HOSPITAL_COMMUNITY)
Admission: RE | Admit: 2017-02-02 | Discharge: 2017-02-02 | Disposition: A | Discharge status: Home / Self Care | Payer: Self-pay | Source: Ambulatory Visit | Attending: Physician Assistant | Admitting: Physician Assistant

## 2017-02-14 ENCOUNTER — Other Ambulatory Visit (HOSPITAL_COMMUNITY)
Admission: RE | Admit: 2017-02-14 | Discharge: 2017-02-14 | Disposition: A | Discharge status: Home / Self Care | Payer: Self-pay | Source: Ambulatory Visit | Attending: Physician Assistant | Admitting: Physician Assistant

## 2017-02-14 LAB — COMPREHENSIVE METABOLIC PANEL
ALBUMIN: 3.5 g/dL (ref 3.5–5.0)
ALK PHOS: 64 U/L (ref 38–126)
ALT: 15 U/L (ref 14–54)
AST: 18 U/L (ref 15–41)
Anion gap: 8 (ref 5–15)
BUN: 10 mg/dL (ref 6–20)
CALCIUM: 9 mg/dL (ref 8.9–10.3)
CO2: 28 mmol/L (ref 22–32)
CREATININE: 0.79 mg/dL (ref 0.44–1.00)
Chloride: 103 mmol/L (ref 101–111)
GFR calc Af Amer: >60 mL/min (ref >60)
GFR calc non Af Amer: >60 mL/min (ref >60)
GLUCOSE: 107 mg/dL — AB (ref 65–99)
Potassium: 3.1 mmol/L — ABNORMAL LOW (ref 3.5–5.1)
SODIUM: 139 mmol/L (ref 135–145)
Total Bilirubin: 0.6 mg/dL (ref 0.3–1.2)
Total Protein: 6.8 g/dL (ref 6.5–8.1)

## 2017-02-14 LAB — LIPID PANEL
Cholesterol: 161 mg/dL (ref 0–200)
HDL: 48 mg/dL (ref >40)
LDL CALC: 97 mg/dL (ref 0–99)
Total CHOL/HDL Ratio: 3.4 RATIO
Triglycerides: 80 mg/dL (ref <150)
VLDL: 16 mg/dL (ref 0–40)

## 2017-02-17 ENCOUNTER — Other Ambulatory Visit (HOSPITAL_COMMUNITY)
Admission: RE | Admit: 2017-02-17 | Discharge: 2017-02-17 | Disposition: A | Discharge status: Home / Self Care | Payer: Self-pay | Source: Ambulatory Visit | Attending: Physician Assistant | Admitting: Physician Assistant

## 2017-02-17 ENCOUNTER — Other Ambulatory Visit: Payer: Self-pay | Admitting: Physician Assistant

## 2017-02-17 ENCOUNTER — Encounter: Payer: Self-pay | Admitting: Physician Assistant

## 2017-02-17 ENCOUNTER — Ambulatory Visit: Payer: Self-pay | Admitting: Physician Assistant

## 2017-02-17 VITALS — BP 132/78 | HR 71 | Temp 97.7°F | Ht 59.0 in | Wt 178.0 lb

## 2017-02-17 DIAGNOSIS — I10 Essential (primary) hypertension: Secondary | ICD-10-CM

## 2017-02-17 DIAGNOSIS — E876 Hypokalemia: Secondary | ICD-10-CM

## 2017-02-17 DIAGNOSIS — E785 Hyperlipidemia, unspecified: Secondary | ICD-10-CM

## 2017-02-17 DIAGNOSIS — Z01419 Encounter for gynecological examination (general) (routine) without abnormal findings: Secondary | ICD-10-CM | POA: Insufficient documentation

## 2017-02-17 DIAGNOSIS — Z124 Encounter for screening for malignant neoplasm of cervix: Secondary | ICD-10-CM

## 2017-02-17 LAB — POTASSIUM: Potassium: 3.3 mmol/L — ABNORMAL LOW (ref 3.5–5.1)

## 2017-02-17 MED ORDER — SIMVASTATIN 20 MG PO TABS: 20.0000 mg | ORAL_TABLET | Freq: Every day | ORAL | 4 refills | Status: DC

## 2017-02-17 NOTE — Progress Notes (Signed)
BP 132/78 (BP Location: Left Arm, Patient Position: Sitting, Cuff Size: Large)   Pulse 71   Temp 97.7 F (36.5 C) (Other (Comment))   Ht 4\' 11"  (1.499 m)   Wt 178 lb (80.7 kg)   LMP 01/14/2017 (Approximate)   SpO2 97%   BMI 35.95 kg/m    Subjective:    Patient ID: Veronica Arnold, female    DOB: Dec 26, 1970, 46 y.o.   MRN: [id number]  HPI: Veronica Arnold is a 46 y.o. female presenting on 02/17/2017 for Follow-up (c/o toothache for 2 weeks) and Gynecologic Exam   HPI   Pt doing well  Relevant past medical, surgical, family and social history reviewed and updated as indicated. Interim medical history since our last visit reviewed. Allergies and medications reviewed and updated.   Current Outpatient Prescriptions:  .  diclofenac (VOLTAREN) 75 MG EC tablet, Take 1 tablet (75 mg total) by mouth 2 (two) times daily., Disp: 60 tablet, Rfl: 0 .  metoprolol tartrate (LOPRESSOR) 25 MG tablet, Take 1 tablet (25 mg total) by mouth 2 (two) times daily., Disp: 60 tablet, Rfl: 3 .  simvastatin (ZOCOR) 20 MG tablet, Take 1 tablet (20 mg total) by mouth daily with breakfast., Disp: 30 tablet, Rfl: 3 .  atorvastatin (LIPITOR) 20 MG tablet, Take 1 tablet (20 mg total) by mouth daily. (Patient not taking: Reported on 11/23/2016), Disp: 90 tablet, Rfl: 2 .  naproxen (NAPROSYN) 500 MG tablet, Take 1 tablet (500 mg total) by mouth 2 (two) times daily. (Patient not taking: Reported on 10/13/2016), Disp: 30 tablet, Rfl: 0  Review of Systems  Constitutional: Negative for appetite change, chills, diaphoresis, fatigue, fever and unexpected weight change.  HENT: Positive for dental problem and ear pain. Negative for congestion, drooling, facial swelling, hearing loss, mouth sores, sneezing, sore throat, trouble swallowing and voice change.   Eyes: Positive for discharge. Negative for pain, redness, itching and visual disturbance.  Respiratory: Negative for cough, choking, shortness of breath and wheezing.    Cardiovascular: Negative for chest pain, palpitations and leg swelling.  Gastrointestinal: Negative for abdominal pain, blood in stool, constipation, diarrhea and vomiting.  Endocrine: Negative for cold intolerance, heat intolerance and polydipsia.  Genitourinary: Negative for decreased urine volume, dysuria and hematuria.  Musculoskeletal: Negative for arthralgias, back pain and gait problem.  Skin: Negative for rash.  Allergic/Immunologic: Negative for environmental allergies.  Neurological: Negative for seizures, syncope, light-headedness and headaches.  Hematological: Negative for adenopathy.  Psychiatric/Behavioral: Negative for agitation, dysphoric mood and suicidal ideas. The patient is not nervous/anxious.     Per HPI unless specifically indicated above     Objective:    BP 132/78 (BP Location: Left Arm, Patient Position: Sitting, Cuff Size: Large)   Pulse 71   Temp 97.7 F (36.5 C) (Other (Comment))   Ht 4\' 11"  (1.499 m)   Wt 178 lb (80.7 kg)   LMP 01/14/2017 (Approximate)   SpO2 97%   BMI 35.95 kg/m   Wt Readings from Last 3 Encounters:  02/17/17 178 lb (80.7 kg)  11/23/16 183 lb (83 kg)  10/13/16 187 lb (84.8 kg)    Physical Exam  Constitutional: She is oriented to person, place, and time. She appears well-developed and well-nourished.  Pulmonary/Chest: Effort normal.  Breast exam normal  Abdominal: Soft. She exhibits no mass. There is no tenderness. There is no rebound and no guarding.  Genitourinary: Vagina normal and uterus normal. No breast swelling, tenderness, discharge or bleeding. There is no rash, tenderness  or lesion on the right labia. There is no rash, tenderness or lesion on the left labia. Cervix exhibits no motion tenderness, no discharge and no friability. Right adnexum displays no mass, no tenderness and no fullness. Left adnexum displays no mass, no tenderness and no fullness.  Genitourinary Comments: (nurse Berenice assisted)  Neurological: She  is alert and oriented to person, place, and time.  Skin: Skin is warm and dry.  Psychiatric: She has a normal mood and affect. Her behavior is normal.  Nursing note and vitals reviewed.   Results for orders placed or performed during the hospital encounter of 02/14/17  Comprehensive metabolic panel  Result Value Ref Range   Sodium 139 135 - 145 mmol/L   Potassium 3.1 (L) 3.5 - 5.1 mmol/L   Chloride 103 101 - 111 mmol/L   CO2 28 22 - 32 mmol/L   Glucose, Bld 107 (H) 65 - 99 mg/dL   BUN 10 6 - 20 mg/dL   Creatinine, Ser 1.610.79 0.44 - 1.00 mg/dL   Calcium 9.0 8.9 - 09.610.3 mg/dL   Total Protein 6.8 6.5 - 8.1 g/dL   Albumin 3.5 3.5 - 5.0 g/dL   AST 18 15 - 41 U/L   ALT 15 14 - 54 U/L   Alkaline Phosphatase 64 38 - 126 U/L   Total Bilirubin 0.6 0.3 - 1.2 mg/dL   GFR calc non Af Amer >60 >60 mL/min   GFR calc Af Amer >60 >60 mL/min   Anion gap 8 5 - 15  Lipid panel  Result Value Ref Range   Cholesterol 161 0 - 200 mg/dL   Triglycerides 80 <045<150 mg/dL   HDL 48 >40>40 mg/dL   Total CHOL/HDL Ratio 3.4 RATIO   VLDL 16 0 - 40 mg/dL   LDL Cholesterol 97 0 - 99 mg/dL      Assessment & Plan:   Encounter Diagnoses  Name Primary?  . Routine Papanicolaou smear Yes  . Hypokalemia   . Essential hypertension   . Hyperlipidemia, unspecified hyperlipidemia type      -Reviewed labs with pt -pt to get K+ rechecked today -pt to continue current medicatons -follow up 3 months. RTO sooner prn

## 2017-02-19 ENCOUNTER — Emergency Department (HOSPITAL_COMMUNITY)
Admission: EM | Admit: 2017-02-19 | Discharge: 2017-02-19 | Disposition: A | Discharge status: Home / Self Care | Payer: Self-pay | Attending: Emergency Medicine | Admitting: Emergency Medicine

## 2017-02-19 ENCOUNTER — Encounter (HOSPITAL_COMMUNITY): Payer: Self-pay | Admitting: Emergency Medicine

## 2017-02-19 DIAGNOSIS — Z79899 Other long term (current) drug therapy: Secondary | ICD-10-CM | POA: Insufficient documentation

## 2017-02-19 DIAGNOSIS — K047 Periapical abscess without sinus: Secondary | ICD-10-CM | POA: Insufficient documentation

## 2017-02-19 DIAGNOSIS — K029 Dental caries, unspecified: Secondary | ICD-10-CM | POA: Insufficient documentation

## 2017-02-19 DIAGNOSIS — I10 Essential (primary) hypertension: Secondary | ICD-10-CM | POA: Insufficient documentation

## 2017-02-19 DIAGNOSIS — F17210 Nicotine dependence, cigarettes, uncomplicated: Secondary | ICD-10-CM | POA: Insufficient documentation

## 2017-02-19 MED ORDER — TRAMADOL HCL 50 MG PO TABS: 50.0000 mg | ORAL_TABLET | Freq: Four times a day (QID) | ORAL | 0 refills | Status: DC | PRN

## 2017-02-19 MED ORDER — AMOXICILLIN 500 MG PO CAPS: 500.0000 mg | ORAL_CAPSULE | Freq: Three times a day (TID) | ORAL | 0 refills | Status: AC

## 2017-02-19 NOTE — ED Notes (Signed)
Pt reports she needs an upper L tooth pulled and has noticed an abscess for several days. Denies fever, N/V/, chills, or foul taste in mouth. Difficulty eating d/t pain.

## 2017-02-19 NOTE — ED Provider Notes (Signed)
[facility] DEPT Provider Note   CSN: [id number] Arrival date & time: 02/19/17  1015     History   Chief Complaint Chief Complaint  Patient presents with  . Dental Pain    HPI Veronica Arnold is a 46 y.o. female presenting with a 2 day history of dental pain and gingival swelling.   The patient has a history of decay in the tooth involved which has recently started to cause increased  pain.  There has been no fevers, chills, nausea or vomiting, also no complaint of difficulty swallowing, although chewing makes pain worse.  The patient has had no medicines prior to arrival for her pain.  [facility] but just found out that they no longer offer dental services.  She has contacted a dentist who accepts payments and plans to set up an appt for extraction within the next several weeks. Marland Kitchen  HPI  Past Medical History:  Diagnosis Date  . High cholesterol   . Hypertension     Patient Active Problem List   Diagnosis Date Noted  . Essential hypertension 11/30/2015  . Hyperlipidemia 11/30/2015  . AP (abdominal pain) 11/30/2015  . Obesity, unspecified 11/30/2015  . Cigarette nicotine dependence without complication 11/30/2015    Past Surgical History:  Procedure Laterality Date  . HERNIA REPAIR    . TUBAL LIGATION    . TUBAL LIGATION      OB History    Gravida Para Term Preterm AB Living   3 3 3     3    SAB TAB Ectopic Multiple Live Births                   Home Medications    Prior to Admission medications   Medication Sig Start Date End Date Taking? Authorizing Provider  metoprolol tartrate (LOPRESSOR) 25 MG tablet Take 1 tablet (25 mg total) by mouth 2 (two) times daily. 05/01/16  Yes Jacquelin Hawking, PA-C  simvastatin (ZOCOR) 20 MG tablet Take 1 tablet (20 mg total) by mouth daily. 02/17/17  Yes Jacquelin Hawking, PA-C  amoxicillin (AMOXIL) 500 MG capsule Take 1 capsule (500 mg total) by mouth 3 (three) times daily. 02/19/17 03/01/17  Burgess Amor,  PA-C  diclofenac (VOLTAREN) 75 MG EC tablet Take 1 tablet (75 mg total) by mouth 2 (two) times daily. Patient not taking: Reported on 02/19/2017 10/13/16   Jacquelin Hawking, PA-C  traMADol (ULTRAM) 50 MG tablet Take 1 tablet (50 mg total) by mouth every 6 (six) hours as needed. 02/19/17   Burgess Amor, PA-C    Family History Family History  Problem Relation Age of Onset  . [name]   . Stroke Brother   . Heart attack Brother     Social History Social History  Substance Use Topics  . Smoking status: Current Every Day Smoker    Packs/day: 0.50    Years: 25.00    Types: Cigarettes  . Smokeless tobacco: Never Used     Comment: cut back to < 1/2 ppd  . Alcohol use No     Allergies   Patient has no known allergies.   Review of Systems Review of Systems  Constitutional: Negative for fever.  HENT: Positive for dental problem. Negative for facial swelling and sore throat.   Respiratory: Negative for shortness of breath.   Musculoskeletal: Negative for neck pain and neck stiffness.     Physical Exam Updated Vital Signs BP (!) 155/86 (BP Location: Right Arm)  Pulse 60   Temp 98.3 F (36.8 C) (Oral)   Resp 20   Ht 4\' 11"  (1.499 m)   Wt 80.7 kg (178 lb)   LMP 01/13/2017   SpO2 99%   BMI 35.95 kg/m   Physical Exam  Constitutional: She is oriented to person, place, and time. She appears well-developed and well-nourished. No distress.  HENT:  Head: Normocephalic and atraumatic.  Right Ear: Tympanic membrane and external ear normal.  Left Ear: Tympanic membrane and external ear normal.  Mouth/Throat: Oropharynx is clear and moist and mucous membranes are normal. No oral lesions. No trismus in the jaw. Abnormal dentition. Dental abscesses and dental caries present.    Eyes: Conjunctivae are normal.  Neck: Normal range of motion. Neck supple.  Cardiovascular: Normal rate and normal heart sounds.   Pulmonary/Chest: Effort normal.  Abdominal: She exhibits no  distension.  Musculoskeletal: Normal range of motion.  Lymphadenopathy:    She has no cervical adenopathy.  Neurological: She is alert and oriented to person, place, and time.  Skin: Skin is warm and dry. No erythema.  Psychiatric: She has a normal mood and affect.     ED Treatments / Results  Labs (all labs ordered are listed, but only abnormal results are displayed) Labs Reviewed - No data to display  EKG  EKG Interpretation None       Radiology No results found.  Procedures Procedures (including critical care time)  Medications Ordered in ED Medications - No data to display   Initial Impression / Assessment and Plan / ED Course  I have reviewed the triage vital signs and the nursing notes.  Pertinent labs & imaging results that were available during my care of the patient were reviewed by me and considered in my medical decision making (see chart for details).     Pt with dental cavity with suspected early abscess.  Amoxil, tramadol, given dental referral information.  Prn f/u anticipated.  Final Clinical Impressions(s) / ED Diagnoses   Final diagnoses:  Dental abscess    New Prescriptions New Prescriptions   AMOXICILLIN (AMOXIL) 500 MG CAPSULE    Take 1 capsule (500 mg total) by mouth 3 (three) times daily.   TRAMADOL (ULTRAM) 50 MG TABLET    Take 1 tablet (50 mg total) by mouth every 6 (six) hours as needed.     Burgess Amordol, Jimmy Plessinger, PA-C 02/19/17 1129    Blane OharaZavitz, Joshua, MD 02/19/17 [phone]

## 2017-02-19 NOTE — ED Triage Notes (Signed)
Patient c/o left upper dental pain with swelling that started 2 days ago. Patient reports low grade fever in which she took tylenol for yesterday.

## 2017-02-19 NOTE — Discharge Instructions (Signed)
Complete your entire course of antibiotics as prescribed.  You  may use the tramadol for pain relief but do not drive within 4 hours of taking as this will make you drowsy.  Avoid applying heat or ice to this abscess area which can worsen your symptoms.  You may use warm salt water swish and spit treatment or half peroxide and water swish and spit after meals to keep this area clean as discussed.  See the attached information about [facility].

## 2017-03-01 ENCOUNTER — Encounter: Payer: Self-pay | Admitting: Student

## 2017-03-03 ENCOUNTER — Telehealth: Payer: Self-pay | Admitting: Student

## 2017-03-03 ENCOUNTER — Other Ambulatory Visit: Payer: Self-pay | Admitting: Physician Assistant

## 2017-03-03 MED ORDER — METRONIDAZOLE 500 MG PO TABS: 2000.0000 mg | ORAL_TABLET | Freq: Once | ORAL | 0 refills | Status: AC

## 2017-03-03 NOTE — Telephone Encounter (Signed)
Pt received letter sent out to her asking her to call the office. Pt's phone number was updated.  Pt was notified that her potassium was still slightly low and PA wanted it rechecked. Pt was instructed to go ASAP due to it should have been done last week. Pt states she will go tomorrow. 03-03-17.  Pt was also notified of pap done 02-17-17. Pt was notified that pap shows no signs of cervical cancer, but does show trich. Pt informed that [facility] (per patient's request). Pt was instructed that she needs to notify all sexual partners so they can seek treatment. Pt was recommended to [facility].   Pt verbalized understanding.

## 2017-03-17 ENCOUNTER — Encounter: Payer: Self-pay | Admitting: Physician Assistant

## 2017-03-18 ENCOUNTER — Other Ambulatory Visit (HOSPITAL_COMMUNITY)
Admission: RE | Admit: 2017-03-18 | Discharge: 2017-03-18 | Disposition: A | Discharge status: Home / Self Care | Payer: Self-pay | Source: Ambulatory Visit | Attending: Physician Assistant | Admitting: Physician Assistant

## 2017-03-18 DIAGNOSIS — E876 Hypokalemia: Secondary | ICD-10-CM | POA: Insufficient documentation

## 2017-03-18 DIAGNOSIS — I10 Essential (primary) hypertension: Secondary | ICD-10-CM | POA: Insufficient documentation

## 2017-03-18 LAB — POTASSIUM: Potassium: 3.3 mmol/L — ABNORMAL LOW (ref 3.5–5.1)

## 2017-03-30 ENCOUNTER — Other Ambulatory Visit: Payer: Self-pay | Admitting: Physician Assistant

## 2017-05-10 ENCOUNTER — Other Ambulatory Visit (HOSPITAL_COMMUNITY)
Admission: RE | Admit: 2017-05-10 | Discharge: 2017-05-10 | Disposition: A | Discharge status: Home / Self Care | Payer: Self-pay | Source: Ambulatory Visit | Attending: Physician Assistant | Admitting: Physician Assistant

## 2017-05-10 DIAGNOSIS — I10 Essential (primary) hypertension: Secondary | ICD-10-CM | POA: Insufficient documentation

## 2017-05-10 DIAGNOSIS — E785 Hyperlipidemia, unspecified: Secondary | ICD-10-CM | POA: Insufficient documentation

## 2017-05-10 DIAGNOSIS — E876 Hypokalemia: Secondary | ICD-10-CM | POA: Insufficient documentation

## 2017-05-10 LAB — COMPREHENSIVE METABOLIC PANEL
ALBUMIN: 3.6 g/dL (ref 3.5–5.0)
ALK PHOS: 71 U/L (ref 38–126)
ALT: 14 U/L (ref 14–54)
AST: 16 U/L (ref 15–41)
Anion gap: 7 (ref 5–15)
BUN: 14 mg/dL (ref 6–20)
CALCIUM: 8.9 mg/dL (ref 8.9–10.3)
CO2: 26 mmol/L (ref 22–32)
CREATININE: 0.65 mg/dL (ref 0.44–1.00)
Chloride: 106 mmol/L (ref 101–111)
GFR calc Af Amer: >60 mL/min (ref >60)
GFR calc non Af Amer: >60 mL/min (ref >60)
GLUCOSE: 125 mg/dL — AB (ref 65–99)
Potassium: 3.3 mmol/L — ABNORMAL LOW (ref 3.5–5.1)
SODIUM: 139 mmol/L (ref 135–145)
Total Bilirubin: 0.5 mg/dL (ref 0.3–1.2)
Total Protein: 6.8 g/dL (ref 6.5–8.1)

## 2017-05-10 LAB — LIPID PANEL
Cholesterol: 170 mg/dL (ref 0–200)
HDL: 53 mg/dL (ref >40)
LDL CALC: 102 mg/dL — AB (ref 0–99)
TRIGLYCERIDES: 74 mg/dL (ref <150)
Total CHOL/HDL Ratio: 3.2 RATIO
VLDL: 15 mg/dL (ref 0–40)

## 2017-05-11 ENCOUNTER — Other Ambulatory Visit: Payer: Self-pay | Admitting: Physician Assistant

## 2017-05-11 MED ORDER — POTASSIUM CHLORIDE ER 10 MEQ PO TBCR: 10.0000 meq | EXTENDED_RELEASE_TABLET | Freq: Every day | ORAL | 0 refills | Status: DC

## 2017-05-19 ENCOUNTER — Ambulatory Visit: Payer: Self-pay | Admitting: Physician Assistant

## 2017-05-19 ENCOUNTER — Encounter: Payer: Self-pay | Admitting: Physician Assistant

## 2017-05-19 VITALS — BP 138/88 | HR 75 | Temp 97.7°F | Ht 59.0 in | Wt 177.8 lb

## 2017-05-19 DIAGNOSIS — E876 Hypokalemia: Secondary | ICD-10-CM

## 2017-05-19 DIAGNOSIS — F17210 Nicotine dependence, cigarettes, uncomplicated: Secondary | ICD-10-CM

## 2017-05-19 DIAGNOSIS — I10 Essential (primary) hypertension: Secondary | ICD-10-CM

## 2017-05-19 DIAGNOSIS — E785 Hyperlipidemia, unspecified: Secondary | ICD-10-CM

## 2017-05-19 MED ORDER — POTASSIUM CHLORIDE ER 10 MEQ PO TBCR: 10.0000 meq | EXTENDED_RELEASE_TABLET | Freq: Every day | ORAL | 1 refills | Status: DC

## 2017-05-19 NOTE — Progress Notes (Signed)
BP 138/88 (BP Location: Left Arm, Patient Position: Sitting, Cuff Size: Large)   Pulse 75   Temp 97.7 F (36.5 C) (Other (Comment))   Ht  (1.499 m)   Wt 177 lb 12 oz (80.6 kg)   LMP 04/25/2017 (Exact Date)   SpO2 98%   BMI 35.90 kg/m    Subjective:    Patient ID: Veronica Arnold, female    DOB: 28-Jun-1971, 46 y.o.   MRN: [id number]  HPI: Veronica Arnold is a 46 y.o. female presenting on 05/19/2017 for Hypertension and Hyperlipidemia   HPI   Pt doing great.  She is a bit anxious about the upcoming storm forecast..  Relevant past medical, surgical, family and social history reviewed and updated as indicated. Interim medical history since our last visit reviewed. Allergies and medications reviewed and updated.    Current Outpatient Prescriptions:  .  metoprolol tartrate (LOPRESSOR) 25 MG tablet, TAKE ONE TABLET BY MOUTH TWICE DAILY, Disp: 60 tablet, Rfl: 3 .  simvastatin (ZOCOR) 20 MG tablet, Take 1 tablet (20 mg total) by mouth daily., Disp: 30 tablet, Rfl: 4 .  traMADol (ULTRAM) 50 MG tablet, Take 1 tablet (50 mg total) by mouth every 6 (six) hours as needed., Disp: 20 tablet, Rfl: 0  Review of Systems  Constitutional: Negative for appetite change, chills, diaphoresis, fatigue, fever and unexpected weight change.  HENT: Positive for dental problem. Negative for congestion, drooling, ear pain, facial swelling, hearing loss, mouth sores, sneezing, sore throat, trouble swallowing and voice change.   Eyes: Negative for pain, discharge, redness, itching and visual disturbance.  Respiratory: Negative for cough, choking, shortness of breath and wheezing.   Cardiovascular: Negative for chest pain, palpitations and leg swelling.  Gastrointestinal: Negative for abdominal pain, blood in stool, constipation, diarrhea and vomiting.  Endocrine: Negative for cold intolerance, heat intolerance and polydipsia.  Genitourinary: Negative for decreased urine volume, dysuria and hematuria.   Musculoskeletal: Negative for arthralgias, back pain and gait problem.  Skin: Negative for rash.  Allergic/Immunologic: Negative for environmental allergies.  Neurological: Negative for seizures, syncope, light-headedness and headaches.  Hematological: Negative for adenopathy.  Psychiatric/Behavioral: Negative for agitation, dysphoric mood and suicidal ideas. The patient is not nervous/anxious.     Per HPI unless specifically indicated above     Objective:    BP 138/88 (BP Location: Left Arm, Patient Position: Sitting, Cuff Size: Large)   Pulse 75   Temp 97.7 F (36.5 C) (Other (Comment))   Ht  (1.499 m)   Wt 177 lb 12 oz (80.6 kg)   LMP 04/25/2017 (Exact Date)   SpO2 98%   BMI 35.90 kg/m   Wt Readings from Last 3 Encounters:  05/19/17 177 lb 12 oz (80.6 kg)  02/19/17 178 lb (80.7 kg)  02/17/17 178 lb (80.7 kg)    Physical Exam  Constitutional: She is oriented to person, place, and time. She appears well-developed and well-nourished.  HENT:  Head: Normocephalic and atraumatic.  Neck: Neck supple.  Cardiovascular: Normal rate and regular rhythm.   Pulmonary/Chest: Effort normal and breath sounds normal.  Abdominal: Soft. Bowel sounds are normal. She exhibits no mass. There is no hepatosplenomegaly. There is no tenderness.  Musculoskeletal: She exhibits no edema.  Lymphadenopathy:    She has no cervical adenopathy.  Neurological: She is alert and oriented to person, place, and time.  Skin: Skin is warm and dry.  Psychiatric: She has a normal mood and affect. Her behavior is normal.  Vitals reviewed.  Results for orders placed or performed during the hospital encounter of 05/10/17  Comprehensive metabolic panel  Result Value Ref Range   Sodium 139 135 - 145 mmol/L   Potassium 3.3 (L) 3.5 - 5.1 mmol/L   Chloride 106 101 - 111 mmol/L   CO2 26 22 - 32 mmol/L   Glucose, Bld 125 (H) 65 - 99 mg/dL   BUN 14 6 - 20 mg/dL   Creatinine, Ser 4.090.65 0.44 - 1.00 mg/dL    Calcium 8.9 8.9 - 81.110.3 mg/dL   Total Protein 6.8 6.5 - 8.1 g/dL   Albumin 3.6 3.5 - 5.0 g/dL   AST 16 15 - 41 U/L   ALT 14 14 - 54 U/L   Alkaline Phosphatase 71 38 - 126 U/L   Total Bilirubin 0.5 0.3 - 1.2 mg/dL   GFR calc non Af Amer >60 >60 mL/min   GFR calc Af Amer >60 >60 mL/min   Anion gap 7 5 - 15  Lipid Profile  Result Value Ref Range   Cholesterol 170 0 - 200 mg/dL   Triglycerides 74 <914<150 mg/dL   HDL 53 >78>40 mg/dL   Total CHOL/HDL Ratio 3.2 RATIO   VLDL 15 0 - 40 mg/dL   LDL Cholesterol 295102 (H) 0 - 99 mg/dL      Assessment & Plan:    Encounter Diagnoses  Name Primary?  . Essential hypertension Yes  . Hyperlipidemia, unspecified hyperlipidemia type   . Hypokalemia   . Cigarette nicotine dependence without complication     -reviewed labs with pt -pt to Continue current rx.  Add K+ to take daily.  Recheck 2 wk -pt will follow up OV 2 months.  RTO sooner prn

## 2017-06-02 ENCOUNTER — Other Ambulatory Visit (HOSPITAL_COMMUNITY)
Admission: RE | Admit: 2017-06-02 | Discharge: 2017-06-02 | Disposition: A | Discharge status: Home / Self Care | Payer: Self-pay | Source: Ambulatory Visit | Attending: Physician Assistant | Admitting: Physician Assistant

## 2017-06-02 DIAGNOSIS — E876 Hypokalemia: Secondary | ICD-10-CM | POA: Insufficient documentation

## 2017-06-02 DIAGNOSIS — I10 Essential (primary) hypertension: Secondary | ICD-10-CM | POA: Insufficient documentation

## 2017-06-02 LAB — POTASSIUM: Potassium: 4.1 mmol/L (ref 3.5–5.1)

## 2017-06-06 ENCOUNTER — Ambulatory Visit: Payer: Self-pay | Admitting: Physician Assistant

## 2017-06-21 ENCOUNTER — Encounter: Payer: Self-pay | Admitting: Student

## 2017-07-19 ENCOUNTER — Ambulatory Visit: Payer: Self-pay | Admitting: Physician Assistant

## 2017-07-30 ENCOUNTER — Other Ambulatory Visit: Payer: Self-pay | Admitting: Physician Assistant

## 2017-07-30 DIAGNOSIS — E876 Hypokalemia: Secondary | ICD-10-CM

## 2017-07-30 DIAGNOSIS — I10 Essential (primary) hypertension: Secondary | ICD-10-CM

## 2017-07-30 NOTE — Progress Notes (Unsigned)
b

## 2017-08-01 ENCOUNTER — Other Ambulatory Visit: Payer: Self-pay | Admitting: Physician Assistant

## 2017-08-01 DIAGNOSIS — E785 Hyperlipidemia, unspecified: Secondary | ICD-10-CM

## 2017-08-01 DIAGNOSIS — E876 Hypokalemia: Secondary | ICD-10-CM

## 2017-08-01 DIAGNOSIS — I10 Essential (primary) hypertension: Secondary | ICD-10-CM

## 2017-08-08 ENCOUNTER — Other Ambulatory Visit (HOSPITAL_COMMUNITY)
Admission: RE | Admit: 2017-08-08 | Discharge: 2017-08-08 | Disposition: A | Discharge status: Home / Self Care | Payer: Self-pay | Source: Ambulatory Visit | Attending: Physician Assistant | Admitting: Physician Assistant

## 2017-08-08 DIAGNOSIS — I10 Essential (primary) hypertension: Secondary | ICD-10-CM | POA: Insufficient documentation

## 2017-08-08 DIAGNOSIS — E785 Hyperlipidemia, unspecified: Secondary | ICD-10-CM | POA: Insufficient documentation

## 2017-08-08 DIAGNOSIS — E876 Hypokalemia: Secondary | ICD-10-CM | POA: Insufficient documentation

## 2017-08-08 LAB — COMPREHENSIVE METABOLIC PANEL
ALK PHOS: 76 U/L (ref 38–126)
ALT: 15 U/L (ref 14–54)
ANION GAP: 7 (ref 5–15)
AST: 17 U/L (ref 15–41)
Albumin: 3.6 g/dL (ref 3.5–5.0)
BILIRUBIN TOTAL: 0.3 mg/dL (ref 0.3–1.2)
BUN: 10 mg/dL (ref 6–20)
CO2: 23 mmol/L (ref 22–32)
Calcium: 8.9 mg/dL (ref 8.9–10.3)
Chloride: 109 mmol/L (ref 101–111)
Creatinine, Ser: 0.75 mg/dL (ref 0.44–1.00)
Glucose, Bld: 105 mg/dL — ABNORMAL HIGH (ref 65–99)
Potassium: 3.7 mmol/L (ref 3.5–5.1)
Sodium: 139 mmol/L (ref 135–145)
TOTAL PROTEIN: 7 g/dL (ref 6.5–8.1)

## 2017-08-08 LAB — LIPID PANEL
CHOL/HDL RATIO: 3.8 ratio
CHOLESTEROL: 168 mg/dL (ref 0–200)
HDL: 44 mg/dL (ref >40)
LDL Cholesterol: 116 mg/dL — ABNORMAL HIGH (ref 0–99)
TRIGLYCERIDES: 41 mg/dL (ref <150)
VLDL: 8 mg/dL (ref 0–40)

## 2017-08-09 ENCOUNTER — Encounter: Payer: Self-pay | Admitting: Physician Assistant

## 2017-08-09 ENCOUNTER — Ambulatory Visit: Payer: Self-pay | Admitting: Physician Assistant

## 2017-08-09 VITALS — BP 134/84 | HR 72 | Temp 97.9°F | Ht 59.0 in | Wt 176.5 lb

## 2017-08-09 DIAGNOSIS — F17210 Nicotine dependence, cigarettes, uncomplicated: Secondary | ICD-10-CM

## 2017-08-09 DIAGNOSIS — E785 Hyperlipidemia, unspecified: Secondary | ICD-10-CM

## 2017-08-09 DIAGNOSIS — I10 Essential (primary) hypertension: Secondary | ICD-10-CM

## 2017-08-09 DIAGNOSIS — E669 Obesity, unspecified: Secondary | ICD-10-CM

## 2017-08-09 NOTE — Progress Notes (Signed)
BP 134/84 (BP Location: Left Arm, Patient Position: Sitting, Cuff Size: Normal)   Pulse 72   Temp 97.9 F (36.6 C) (Other (Comment))   Ht 4\' 11"  (1.499 m)   Wt 176 lb 8 oz (80.1 kg)   LMP 08/06/2017 (Exact Date)   SpO2 98%   BMI 35.65 kg/m    Subjective:    Patient ID: Veronica RichesFelicia A Forton, female    DOB: February 24, 1971, 46 y.o.   MRN: [id number]  HPI: Veronica Arnold is a 46 y.o. female presenting on 08/09/2017 for Follow-up   HPI   Pt is doing well.  No complaints.  She is still smoking  Relevant past medical, surgical, family and social history reviewed and updated as indicated. Interim medical history since our last visit reviewed. Allergies and medications reviewed and updated.   Current Outpatient Medications:  .  metoprolol tartrate (LOPRESSOR) 25 MG tablet, TAKE ONE TABLET BY MOUTH TWICE DAILY, Disp: 60 tablet, Rfl: 3 .  simvastatin (ZOCOR) 20 MG tablet, Take 1 tablet (20 mg total) by mouth daily., Disp: 30 tablet, Rfl: 4   Review of Systems  Constitutional: Negative for appetite change, chills, diaphoresis, fatigue, fever and unexpected weight change.  HENT: Positive for dental problem. Negative for congestion, drooling, ear pain, facial swelling, hearing loss, mouth sores, sneezing, sore throat, trouble swallowing and voice change.   Eyes: Negative for pain, discharge, redness, itching and visual disturbance.  Respiratory: Negative for cough, choking, shortness of breath and wheezing.   Cardiovascular: Negative for chest pain, palpitations and leg swelling.  Gastrointestinal: Negative for abdominal pain, blood in stool, constipation, diarrhea and vomiting.  Endocrine: Negative for cold intolerance, heat intolerance and polydipsia.  Genitourinary: Negative for decreased urine volume, dysuria and hematuria.  Musculoskeletal: Positive for back pain. Negative for arthralgias and gait problem.  Skin: Negative for rash.  Allergic/Immunologic: Negative for environmental allergies.   Neurological: Negative for seizures, syncope, light-headedness and headaches.  Hematological: Negative for adenopathy.  Psychiatric/Behavioral: Negative for agitation, dysphoric mood and suicidal ideas. The patient is not nervous/anxious.     Per HPI unless specifically indicated above     Objective:    BP 134/84 (BP Location: Left Arm, Patient Position: Sitting, Cuff Size: Normal)   Pulse 72   Temp 97.9 F (36.6 C) (Other (Comment))   Ht 4\' 11"  (1.499 m)   Wt 176 lb 8 oz (80.1 kg)   LMP 08/06/2017 (Exact Date)   SpO2 98%   BMI 35.65 kg/m   Wt Readings from Last 3 Encounters:  08/09/17 176 lb 8 oz (80.1 kg)  05/19/17 177 lb 12 oz (80.6 kg)  02/19/17 178 lb (80.7 kg)    Physical Exam  Constitutional: She is oriented to person, place, and time. She appears well-developed and well-nourished.  HENT:  Head: Normocephalic and atraumatic.  Neck: Neck supple.  Cardiovascular: Normal rate and regular rhythm.  Pulmonary/Chest: Effort normal and breath sounds normal.  Abdominal: Soft. Bowel sounds are normal. She exhibits no mass. There is no hepatosplenomegaly. There is no tenderness.  Musculoskeletal: She exhibits no edema.  Lymphadenopathy:    She has no cervical adenopathy.  Neurological: She is alert and oriented to person, place, and time.  Skin: Skin is warm and dry.  Psychiatric: She has a normal mood and affect. Her behavior is normal.  Vitals reviewed.   Results for orders placed or performed during the hospital encounter of 08/08/17  Comprehensive metabolic panel  Result Value Ref Range   Sodium 139  135 - 145 mmol/L   Potassium 3.7 3.5 - 5.1 mmol/L   Chloride 109 101 - 111 mmol/L   CO2 23 22 - 32 mmol/L   Glucose, Bld 105 (H) 65 - 99 mg/dL   BUN 10 6 - 20 mg/dL   Creatinine, Ser 1.610.75 0.44 - 1.00 mg/dL   Calcium 8.9 8.9 - 09.610.3 mg/dL   Total Protein 7.0 6.5 - 8.1 g/dL   Albumin 3.6 3.5 - 5.0 g/dL   AST 17 15 - 41 U/L   ALT 15 14 - 54 U/L   Alkaline Phosphatase  76 38 - 126 U/L   Total Bilirubin 0.3 0.3 - 1.2 mg/dL   GFR calc non Af Amer >60 >60 mL/min   GFR calc Af Amer >60 >60 mL/min   Anion gap 7 5 - 15  Lipid panel  Result Value Ref Range   Cholesterol 168 0 - 200 mg/dL   Triglycerides 41 <045<150 mg/dL   HDL 44 >40>40 mg/dL   Total CHOL/HDL Ratio 3.8 RATIO   VLDL 8 0 - 40 mg/dL   LDL Cholesterol 981116 (H) 0 - 99 mg/dL      Assessment & Plan:   Encounter Diagnoses  Name Primary?  . Essential hypertension Yes  . Hyperlipidemia, unspecified hyperlipidemia type   . Cigarette nicotine dependence without complication   . Obesity, unspecified classification, unspecified obesity type, unspecified whether serious comorbidity present     -reviewed labs with pt -pt to continue current medications -counseled smoking cessation -pt to follow up 3 months.  RTO sooner prn

## 2017-11-04 ENCOUNTER — Other Ambulatory Visit: Payer: Self-pay | Admitting: Physician Assistant

## 2017-11-07 ENCOUNTER — Ambulatory Visit: Payer: Self-pay | Admitting: Physician Assistant

## 2017-11-15 ENCOUNTER — Other Ambulatory Visit (HOSPITAL_COMMUNITY)
Admission: RE | Admit: 2017-11-15 | Discharge: 2017-11-15 | Disposition: A | Discharge status: Home / Self Care | Payer: Self-pay | Source: Ambulatory Visit | Attending: Physician Assistant | Admitting: Physician Assistant

## 2017-11-15 DIAGNOSIS — E785 Hyperlipidemia, unspecified: Secondary | ICD-10-CM | POA: Insufficient documentation

## 2017-11-15 DIAGNOSIS — I10 Essential (primary) hypertension: Secondary | ICD-10-CM | POA: Insufficient documentation

## 2017-11-15 LAB — COMPREHENSIVE METABOLIC PANEL
ALK PHOS: 75 U/L (ref 38–126)
ALT: 15 U/L (ref 14–54)
ANION GAP: 11 (ref 5–15)
AST: 20 U/L (ref 15–41)
Albumin: 3.5 g/dL (ref 3.5–5.0)
BILIRUBIN TOTAL: 0.8 mg/dL (ref 0.3–1.2)
BUN: 11 mg/dL (ref 6–20)
CALCIUM: 9.1 mg/dL (ref 8.9–10.3)
CO2: 26 mmol/L (ref 22–32)
CREATININE: 0.65 mg/dL (ref 0.44–1.00)
Chloride: 105 mmol/L (ref 101–111)
GFR calc non Af Amer: >60 mL/min (ref >60)
Glucose, Bld: 117 mg/dL — ABNORMAL HIGH (ref 65–99)
Potassium: 3.6 mmol/L (ref 3.5–5.1)
Sodium: 142 mmol/L (ref 135–145)
TOTAL PROTEIN: 7.1 g/dL (ref 6.5–8.1)

## 2017-11-15 LAB — LIPID PANEL
Cholesterol: 190 mg/dL (ref 0–200)
HDL: 47 mg/dL (ref >40)
LDL Cholesterol: 114 mg/dL — ABNORMAL HIGH (ref 0–99)
TRIGLYCERIDES: 143 mg/dL (ref <150)
Total CHOL/HDL Ratio: 4 RATIO
VLDL: 29 mg/dL (ref 0–40)

## 2017-11-17 ENCOUNTER — Ambulatory Visit: Payer: Self-pay | Admitting: Physician Assistant

## 2017-11-17 VITALS — BP 140/86 | HR 75 | Temp 97.7°F | Ht 59.0 in | Wt 180.0 lb

## 2017-11-17 DIAGNOSIS — Z1239 Encounter for other screening for malignant neoplasm of breast: Secondary | ICD-10-CM

## 2017-11-17 DIAGNOSIS — M67432 Ganglion, left wrist: Secondary | ICD-10-CM

## 2017-11-17 DIAGNOSIS — I10 Essential (primary) hypertension: Secondary | ICD-10-CM

## 2017-11-17 DIAGNOSIS — E785 Hyperlipidemia, unspecified: Secondary | ICD-10-CM

## 2017-11-17 DIAGNOSIS — F17210 Nicotine dependence, cigarettes, uncomplicated: Secondary | ICD-10-CM

## 2017-11-17 NOTE — Progress Notes (Signed)
BP 140/86 (BP Location: Left Arm, Patient Position: Sitting, Cuff Size: Normal)   Pulse 75   Temp 97.7 F (36.5 C)   Ht 4\' 11"  (1.499 m)   Wt 180 lb (81.6 kg)   SpO2 98%   BMI 36.36 kg/m    Subjective:    Patient ID: Veronica RichesFelicia A [place]thgoe, female    DOB: 1971/01/03, 47 y.o.   MRN: [id number]  HPI: Veronica Arnold is a 47 y.o. female presenting on 11/17/2017 for Hypertension (pt states she hasn't taken her bp med today because she hasn't eaten ) and Hyperlipidemia   HPI   She is good.  She has bump that came on her L wrist 3 days ago.  No injury. No pain  Relevant past medical, surgical, family and social history reviewed and updated as indicated. Interim medical history since our last visit reviewed. Allergies and medications reviewed and updated.   Current Outpatient Medications:  .  metoprolol tartrate (LOPRESSOR) 25 MG tablet, TAKE 1 TABLET BY MOUTH TWICE DAILY, Disp: 60 tablet, Rfl: 3 .  simvastatin (ZOCOR) 20 MG tablet, Take 1 tablet (20 mg total) by mouth daily., Disp: 30 tablet, Rfl: 4   Review of Systems  Constitutional: Negative for appetite change, chills, diaphoresis, fatigue, fever and unexpected weight change.  HENT: Positive for dental problem and ear pain. Negative for congestion, drooling, facial swelling, hearing loss, mouth sores, sneezing, sore throat, trouble swallowing and voice change.   Eyes: Positive for discharge. Negative for pain, redness, itching and visual disturbance.  Respiratory: Negative for cough, choking, shortness of breath and wheezing.   Cardiovascular: Negative for chest pain, palpitations and leg swelling.  Gastrointestinal: Negative for abdominal pain, blood in stool, constipation, diarrhea and vomiting.  Endocrine: Negative for cold intolerance, heat intolerance and polydipsia.  Genitourinary: Negative for decreased urine volume, dysuria and hematuria.  Musculoskeletal: Negative for arthralgias, back pain and gait problem.  Skin: Negative  for rash.  Allergic/Immunologic: Negative for environmental allergies.  Neurological: Negative for seizures, syncope, light-headedness and headaches.  Hematological: Negative for adenopathy.  Psychiatric/Behavioral: Negative for agitation, dysphoric mood and suicidal ideas. The patient is not nervous/anxious.     Per HPI unless specifically indicated above     Objective:    BP 140/86 (BP Location: Left Arm, Patient Position: Sitting, Cuff Size: Normal)   Pulse 75   Temp 97.7 F (36.5 C)   Ht 4\' 11"  (1.499 m)   Wt 180 lb (81.6 kg)   SpO2 98%   BMI 36.36 kg/m   Wt Readings from Last 3 Encounters:  11/17/17 180 lb (81.6 kg)  08/09/17 176 lb 8 oz (80.1 kg)  05/19/17 177 lb 12 oz (80.6 kg)    Physical Exam  Constitutional: She is oriented to person, place, and time. She appears well-developed and well-nourished.  HENT:  Head: Normocephalic and atraumatic.  Neck: Neck supple.  Cardiovascular: Normal rate and regular rhythm.  Pulmonary/Chest: Effort normal and breath sounds normal.  Abdominal: Soft. Bowel sounds are normal. She exhibits no mass. There is no hepatosplenomegaly. There is no tenderness.  Musculoskeletal: She exhibits no edema.       Left wrist: She exhibits normal range of motion, no tenderness, no bony tenderness and no swelling.  There is a small pea sized nodule dorsal surface L wrist which is nontender  Lymphadenopathy:    She has no cervical adenopathy.  Neurological: She is alert and oriented to person, place, and time.  Skin: Skin is warm and dry.  Psychiatric: She has a normal mood and affect. Her behavior is normal.  Vitals reviewed.   Results for orders placed or performed during the hospital encounter of 11/15/17  Comprehensive metabolic panel  Result Value Ref Range   Sodium 142 135 - 145 mmol/L   Potassium 3.6 3.5 - 5.1 mmol/L   Chloride 105 101 - 111 mmol/L   CO2 26 22 - 32 mmol/L   Glucose, Bld 117 (H) 65 - 99 mg/dL   BUN 11 6 - 20 mg/dL    Creatinine, Ser 1.61 0.44 - 1.00 mg/dL   Calcium 9.1 8.9 - 09.6 mg/dL   Total Protein 7.1 6.5 - 8.1 g/dL   Albumin 3.5 3.5 - 5.0 g/dL   AST 20 15 - 41 U/L   ALT 15 14 - 54 U/L   Alkaline Phosphatase 75 38 - 126 U/L   Total Bilirubin 0.8 0.3 - 1.2 mg/dL   GFR calc non Af Amer >60 >60 mL/min   GFR calc Af Amer >60 >60 mL/min   Anion gap 11 5 - 15  Lipid panel  Result Value Ref Range   Cholesterol 190 0 - 200 mg/dL   Triglycerides 045 <409 mg/dL   HDL 47 >81 mg/dL   Total CHOL/HDL Ratio 4.0 RATIO   VLDL 29 0 - 40 mg/dL   LDL Cholesterol 191 (H) 0 - 99 mg/dL      Assessment & Plan:    Encounter Diagnoses  Name Primary?  . Essential hypertension Yes  . Hyperlipidemia, unspecified hyperlipidemia type   . Cigarette nicotine dependence without complication   . Ganglion cyst of dorsum of left wrist   . Screening for breast cancer     -reviewed labs with pt -pt to continue current medications -ordered screening Mammogram -pt reassured about ganglion -counseled smoking cessation -pt to Follow up 3 months.  RTO sooner prn

## 2017-11-18 ENCOUNTER — Encounter: Payer: Self-pay | Admitting: Physician Assistant

## 2017-11-24 ENCOUNTER — Other Ambulatory Visit: Payer: Self-pay | Admitting: Physician Assistant

## 2017-11-24 DIAGNOSIS — Z1231 Encounter for screening mammogram for malignant neoplasm of breast: Secondary | ICD-10-CM

## 2017-12-15 ENCOUNTER — Other Ambulatory Visit: Payer: Self-pay | Admitting: Physician Assistant

## 2018-02-16 ENCOUNTER — Ambulatory Visit: Payer: Self-pay | Admitting: Physician Assistant

## 2018-02-21 ENCOUNTER — Other Ambulatory Visit (HOSPITAL_COMMUNITY)
Admission: RE | Admit: 2018-02-21 | Discharge: 2018-02-21 | Disposition: A | Discharge status: Home / Self Care | Payer: Self-pay | Source: Ambulatory Visit | Attending: Physician Assistant | Admitting: Physician Assistant

## 2018-02-21 DIAGNOSIS — I10 Essential (primary) hypertension: Secondary | ICD-10-CM | POA: Insufficient documentation

## 2018-02-21 DIAGNOSIS — E785 Hyperlipidemia, unspecified: Secondary | ICD-10-CM | POA: Insufficient documentation

## 2018-02-21 LAB — COMPREHENSIVE METABOLIC PANEL
ALT: 14 U/L (ref 14–54)
ANION GAP: 7 (ref 5–15)
AST: 14 U/L — AB (ref 15–41)
Albumin: 3.5 g/dL (ref 3.5–5.0)
Alkaline Phosphatase: 79 U/L (ref 38–126)
BUN: 10 mg/dL (ref 6–20)
CHLORIDE: 108 mmol/L (ref 101–111)
CO2: 25 mmol/L (ref 22–32)
Calcium: 8.6 mg/dL — ABNORMAL LOW (ref 8.9–10.3)
Creatinine, Ser: 0.59 mg/dL (ref 0.44–1.00)
Glucose, Bld: 102 mg/dL — ABNORMAL HIGH (ref 65–99)
POTASSIUM: 3.6 mmol/L (ref 3.5–5.1)
Sodium: 140 mmol/L (ref 135–145)
Total Bilirubin: 0.2 mg/dL — ABNORMAL LOW (ref 0.3–1.2)
Total Protein: 6.9 g/dL (ref 6.5–8.1)

## 2018-02-21 LAB — LIPID PANEL
CHOL/HDL RATIO: 3.8 ratio
Cholesterol: 179 mg/dL (ref 0–200)
HDL: 47 mg/dL (ref >40)
LDL CALC: 110 mg/dL — AB (ref 0–99)
Triglycerides: 109 mg/dL (ref <150)
VLDL: 22 mg/dL (ref 0–40)

## 2018-02-23 ENCOUNTER — Encounter: Payer: Self-pay | Admitting: Physician Assistant

## 2018-02-23 ENCOUNTER — Ambulatory Visit: Payer: Self-pay | Admitting: Physician Assistant

## 2018-02-23 VITALS — BP 131/72 | HR 65 | Temp 97.9°F | Ht 59.0 in | Wt 177.0 lb

## 2018-02-23 DIAGNOSIS — E669 Obesity, unspecified: Secondary | ICD-10-CM

## 2018-02-23 DIAGNOSIS — F17210 Nicotine dependence, cigarettes, uncomplicated: Secondary | ICD-10-CM

## 2018-02-23 DIAGNOSIS — I10 Essential (primary) hypertension: Secondary | ICD-10-CM

## 2018-02-23 DIAGNOSIS — E785 Hyperlipidemia, unspecified: Secondary | ICD-10-CM

## 2018-02-23 MED ORDER — METOPROLOL TARTRATE 25 MG PO TABS: 25.0000 mg | ORAL_TABLET | Freq: Two times a day (BID) | ORAL | 4 refills | Status: DC

## 2018-02-23 MED ORDER — SIMVASTATIN 20 MG PO TABS: 20.0000 mg | ORAL_TABLET | Freq: Every day | ORAL | 4 refills | Status: DC

## 2018-02-23 NOTE — Progress Notes (Signed)
BP 131/72 (BP Location: Right Arm, Patient Position: Sitting, Cuff Size: Normal)   Pulse 65   Temp 97.9 F (36.6 C) (Other (Comment))   Ht 4\' 11"  (1.499 m)   Wt 177 lb (80.3 kg)   LMP 02/17/2018 (Exact Date)   SpO2 100%   BMI 35.75 kg/m    Subjective:    Patient ID: Veronica Arnold, female    DOB: [birth year], 47 y.o.   MRN: [id number]  HPI: Veronica Arnold is a 47 y.o. female presenting on 02/23/2018 for Hypertension and Hyperlipidemia   HPI   Pt is doing well and has no complaints  Relevant past medical, surgical, family and social history reviewed and updated as indicated. Interim medical history since our last visit reviewed. Allergies and medications reviewed and updated.   Current Outpatient Medications:  .  metoprolol tartrate (LOPRESSOR) 25 MG tablet, Take 1 tablet (25 mg total) by mouth 2 (two) times daily., Disp: 60 tablet, Rfl: 4 .  simvastatin (ZOCOR) 20 MG tablet, Take 1 tablet (20 mg total) by mouth at bedtime., Disp: 30 tablet, Rfl: 4   Review of Systems  Constitutional: Negative for appetite change, chills, diaphoresis, fatigue, fever and unexpected weight change.  HENT: Positive for dental problem and ear pain. Negative for congestion, drooling, facial swelling, hearing loss, mouth sores, sneezing, sore throat, trouble swallowing and voice change.   Eyes: Positive for discharge. Negative for pain, redness, itching and visual disturbance.  Respiratory: Negative for cough, choking, shortness of breath and wheezing.   Cardiovascular: Negative for chest pain, palpitations and leg swelling.  Gastrointestinal: Negative for abdominal pain, blood in stool, constipation, diarrhea and vomiting.  Endocrine: Negative for cold intolerance, heat intolerance and polydipsia.  Genitourinary: Negative for decreased urine volume, dysuria and hematuria.  Musculoskeletal: Negative for arthralgias, back pain and gait problem.  Skin: Negative for rash.  Allergic/Immunologic:  Negative for environmental allergies.  Neurological: Negative for seizures, syncope, light-headedness and headaches.  Hematological: Negative for adenopathy.  Psychiatric/Behavioral: Negative for agitation, dysphoric mood and suicidal ideas. The patient is not nervous/anxious.     Per HPI unless specifically indicated above     Objective:    BP 131/72 (BP Location: Right Arm, Patient Position: Sitting, Cuff Size: Normal)   Pulse 65   Temp 97.9 F (36.6 C) (Other (Comment))   Ht 4\' 11"  (1.499 m)   Wt 177 lb (80.3 kg)   LMP 02/17/2018 (Exact Date)   SpO2 100%   BMI 35.75 kg/m   Wt Readings from Last 3 Encounters:  02/23/18 177 lb (80.3 kg)  11/17/17 180 lb (81.6 kg)  08/09/17 176 lb 8 oz (80.1 kg)    Physical Exam  Constitutional: She is oriented to person, place, and time. She appears well-developed and well-nourished.  HENT:  Head: Normocephalic and atraumatic.  Neck: Neck supple.  Cardiovascular: Normal rate and regular rhythm.  Pulmonary/Chest: Effort normal and breath sounds normal.  Abdominal: Soft. Bowel sounds are normal. She exhibits no mass. There is no hepatosplenomegaly. There is no tenderness.  Musculoskeletal: She exhibits no edema.  Lymphadenopathy:    She has no cervical adenopathy.  Neurological: She is alert and oriented to person, place, and time.  Skin: Skin is warm and dry.  Psychiatric: She has a normal mood and affect. Her behavior is normal.  Vitals reviewed.   Results for orders placed or performed during the hospital encounter of 02/21/18  Lipid panel  Result Value Ref Range   Cholesterol 179 0 -  200 mg/dL   Triglycerides 161109 <096<150 mg/dL   HDL 47 >04>40 mg/dL   Total CHOL/HDL Ratio 3.8 RATIO   VLDL 22 0 - 40 mg/dL   LDL Cholesterol 540110 (H) 0 - 99 mg/dL  Comprehensive metabolic panel  Result Value Ref Range   Sodium 140 135 - 145 mmol/L   Potassium 3.6 3.5 - 5.1 mmol/L   Chloride 108 101 - 111 mmol/L   CO2 25 22 - 32 mmol/L   Glucose, Bld  102 (H) 65 - 99 mg/dL   BUN 10 6 - 20 mg/dL   Creatinine, Ser 9.810.59 0.44 - 1.00 mg/dL   Calcium 8.6 (L) 8.9 - 10.3 mg/dL   Total Protein 6.9 6.5 - 8.1 g/dL   Albumin 3.5 3.5 - 5.0 g/dL   AST 14 (L) 15 - 41 U/L   ALT 14 14 - 54 U/L   Alkaline Phosphatase 79 38 - 126 U/L   Total Bilirubin 0.2 (L) 0.3 - 1.2 mg/dL   GFR calc non Af Amer >60 >60 mL/min   GFR calc Af Amer >60 >60 mL/min   Anion gap 7 5 - 15      Assessment & Plan:    Encounter Diagnoses  Name Primary?  . Essential hypertension Yes  . Hyperlipidemia, unspecified hyperlipidemia type   . Cigarette nicotine dependence without complication   . Obesity, unspecified classification, unspecified obesity type, unspecified whether serious comorbidity present     -reviewed labs with pt -pt to continue current medications -pt to follow up 4 months.  RTO sooner prn

## 2018-06-20 ENCOUNTER — Other Ambulatory Visit (HOSPITAL_COMMUNITY)
Admission: RE | Admit: 2018-06-20 | Discharge: 2018-06-20 | Disposition: A | Discharge status: Home / Self Care | Payer: Self-pay | Source: Ambulatory Visit | Attending: Physician Assistant | Admitting: Physician Assistant

## 2018-06-20 DIAGNOSIS — I10 Essential (primary) hypertension: Secondary | ICD-10-CM

## 2018-06-20 DIAGNOSIS — E785 Hyperlipidemia, unspecified: Secondary | ICD-10-CM

## 2018-06-20 LAB — COMPREHENSIVE METABOLIC PANEL
ALT: 19 U/L (ref 0–44)
AST: 17 U/L (ref 15–41)
Albumin: 3.8 g/dL (ref 3.5–5.0)
Alkaline Phosphatase: 82 U/L (ref 38–126)
Anion gap: 9 (ref 5–15)
BUN: 7 mg/dL (ref 6–20)
CO2: 21 mmol/L — ABNORMAL LOW (ref 22–32)
Calcium: 8.8 mg/dL — ABNORMAL LOW (ref 8.9–10.3)
Chloride: 109 mmol/L (ref 98–111)
Creatinine, Ser: 0.66 mg/dL (ref 0.44–1.00)
GFR calc Af Amer: >60 mL/min (ref >60)
GFR calc non Af Amer: >60 mL/min (ref >60)
Glucose, Bld: 114 mg/dL — ABNORMAL HIGH (ref 70–99)
Potassium: 3.6 mmol/L (ref 3.5–5.1)
Sodium: 139 mmol/L (ref 135–145)
Total Bilirubin: 0.6 mg/dL (ref 0.3–1.2)
Total Protein: 7.7 g/dL (ref 6.5–8.1)

## 2018-06-20 LAB — LIPID PANEL
CHOL/HDL RATIO: 3.7 ratio
CHOLESTEROL: 180 mg/dL (ref 0–200)
HDL: 49 mg/dL (ref >40)
LDL Cholesterol: 118 mg/dL — ABNORMAL HIGH (ref 0–99)
TRIGLYCERIDES: 65 mg/dL (ref <150)
VLDL: 13 mg/dL (ref 0–40)

## 2018-06-22 ENCOUNTER — Ambulatory Visit: Payer: Self-pay | Admitting: Physician Assistant

## 2018-06-29 ENCOUNTER — Ambulatory Visit: Payer: Self-pay | Admitting: Physician Assistant

## 2018-06-29 ENCOUNTER — Encounter: Payer: Self-pay | Admitting: Physician Assistant

## 2018-06-29 VITALS — BP 130/90 | HR 68 | Temp 97.9°F | Wt 184.5 lb

## 2018-06-29 DIAGNOSIS — F17200 Nicotine dependence, unspecified, uncomplicated: Secondary | ICD-10-CM

## 2018-06-29 DIAGNOSIS — E785 Hyperlipidemia, unspecified: Secondary | ICD-10-CM

## 2018-06-29 DIAGNOSIS — N938 Other specified abnormal uterine and vaginal bleeding: Secondary | ICD-10-CM

## 2018-06-29 DIAGNOSIS — I10 Essential (primary) hypertension: Secondary | ICD-10-CM

## 2018-06-29 MED ORDER — SIMVASTATIN 40 MG PO TABS: 40.0000 mg | ORAL_TABLET | Freq: Every day | ORAL | 4 refills | Status: DC

## 2018-06-29 MED ORDER — METOPROLOL TARTRATE 50 MG PO TABS: 50.0000 mg | ORAL_TABLET | Freq: Two times a day (BID) | ORAL | 4 refills | Status: DC

## 2018-06-29 NOTE — Progress Notes (Signed)
BP 130/90 (BP Location: Left Arm, Patient Position: Sitting, Cuff Size: Normal)   Pulse 68   Temp 97.9 F (36.6 C)   Wt 184 lb 8 oz (83.7 kg)   SpO2 98%   BMI 37.26 kg/m    Subjective:    Patient ID: Veronica Arnold, female    DOB: [birth year], 47 y.o.   MRN: [id number]  HPI: Veronica Arnold is a 47 y.o. female presenting on 06/29/2018 for Hypertension and Hyperlipidemia   HPI   Chief Complaint  Patient presents with  . Hypertension  . Hyperlipidemia     pt states no period for the past 2 months and now started on 06/26/2018 and is having "very heavy bleeding" with clots. pt states she went through a pack of 48 pads in 2 days. pt c/o menstrual cramps worse then when her period is regular.  Pt is requesting something to help her bleeding.    Pt is doing well other than the heavy menses.  PAP 2018 normal.   Relevant past medical, surgical, family and social history reviewed and updated as indicated. Interim medical history since our last visit reviewed. Allergies and medications reviewed and updated.   Current Outpatient Medications:  .  metoprolol tartrate (LOPRESSOR) 25 MG tablet, Take 1 tablet (25 mg total) by mouth 2 (two) times daily., Disp: 60 tablet, Rfl: 4 .  simvastatin (ZOCOR) 20 MG tablet, Take 1 tablet (20 mg total) by mouth at bedtime., Disp: 30 tablet, Rfl: 4  Review of Systems  Constitutional: Negative for appetite change, chills, diaphoresis, fatigue, fever and unexpected weight change.  HENT: Positive for dental problem and ear pain. Negative for congestion, drooling, facial swelling, hearing loss, mouth sores, sneezing, sore throat, trouble swallowing and voice change.   Eyes: Negative for pain, discharge, redness, itching and visual disturbance.  Respiratory: Negative for cough, choking, shortness of breath and wheezing.   Cardiovascular: Negative for chest pain, palpitations and leg swelling.  Gastrointestinal: Negative for abdominal pain, blood in stool,  constipation, diarrhea and vomiting.  Endocrine: Negative for cold intolerance, heat intolerance and polydipsia.  Genitourinary: Negative for decreased urine volume, dysuria and hematuria.  Musculoskeletal: Positive for arthralgias. Negative for back pain and gait problem.  Skin: Negative for rash.  Allergic/Immunologic: Negative for environmental allergies.  Neurological: Negative for seizures, syncope, light-headedness and headaches.  Hematological: Negative for adenopathy.  Psychiatric/Behavioral: Negative for agitation, dysphoric mood and suicidal ideas. The patient is not nervous/anxious.     Per HPI unless specifically indicated above     Objective:    BP 130/90 (BP Location: Left Arm, Patient Position: Sitting, Cuff Size: Normal)   Pulse 68   Temp 97.9 F (36.6 C)   Wt 184 lb 8 oz (83.7 kg)   SpO2 98%   BMI 37.26 kg/m   Wt Readings from Last 3 Encounters:  06/29/18 184 lb 8 oz (83.7 kg)  02/23/18 177 lb (80.3 kg)  11/17/17 180 lb (81.6 kg)    Physical Exam  Constitutional: She is oriented to person, place, and time. She appears well-developed and well-nourished.  HENT:  Head: Normocephalic and atraumatic.  Neck: Neck supple.  Cardiovascular: Normal rate and regular rhythm.  Pulmonary/Chest: Effort normal and breath sounds normal.  Abdominal: Soft. Bowel sounds are normal. She exhibits no mass. There is no hepatosplenomegaly. There is no tenderness.  Musculoskeletal: She exhibits no edema.  Lymphadenopathy:    She has no cervical adenopathy.  Neurological: She is alert and oriented to person, place,  and time.  Skin: Skin is warm and dry.  Psychiatric: She has a normal mood and affect. Her behavior is normal.  Vitals reviewed.   Results for orders placed or performed during the hospital encounter of 06/20/18  Comprehensive metabolic panel  Result Value Ref Range   Sodium 139 135 - 145 mmol/L   Potassium 3.6 3.5 - 5.1 mmol/L   Chloride 109 98 - 111 mmol/L   CO2  21 (L) 22 - 32 mmol/L   Glucose, Bld 114 (H) 70 - 99 mg/dL   BUN 7 6 - 20 mg/dL   Creatinine, Ser 8.29 0.44 - 1.00 mg/dL   Calcium 8.8 (L) 8.9 - 10.3 mg/dL   Total Protein 7.7 6.5 - 8.1 g/dL   Albumin 3.8 3.5 - 5.0 g/dL   AST 17 15 - 41 U/L   ALT 19 0 - 44 U/L   Alkaline Phosphatase 82 38 - 126 U/L   Total Bilirubin 0.6 0.3 - 1.2 mg/dL   GFR calc non Af Amer >60 >60 mL/min   GFR calc Af Amer >60 >60 mL/min   Anion gap 9 5 - 15  Lipid panel  Result Value Ref Range   Cholesterol 180 0 - 200 mg/dL   Triglycerides 65 <562 mg/dL   HDL 49 >13 mg/dL   Total CHOL/HDL Ratio 3.7 RATIO   VLDL 13 0 - 40 mg/dL   LDL Cholesterol 086 (H) 0 - 99 mg/dL      Assessment & Plan:   Encounter Diagnoses  Name Primary?  . Essential hypertension Yes  . Hyperlipidemia, unspecified hyperlipidemia type   . Tobacco use disorder   . DUB (dysfunctional uterine bleeding)     -reviewed labs with pt -increase zocor to 40 mg -increase metoprolol to 50 mg bid -pt is a smoker so not a candidate for hormones -will get pelvic US to evaluate causes of DUB prior to medication -pt is given application for [facility] -pt to follow up 1 mo to recheck bp and review [place]

## 2018-06-29 NOTE — Patient Instructions (Signed)
Arrive to ultrasound with full bladder

## 2018-07-04 ENCOUNTER — Telehealth: Payer: Self-pay | Admitting: Student

## 2018-07-04 NOTE — Telephone Encounter (Signed)
Pt called stating she was no longer bleeding and wanted her pelvic US appointment cancelled.  LPN called [facility] central scheduling and cancelled appointment.

## 2018-07-05 ENCOUNTER — Ambulatory Visit (HOSPITAL_COMMUNITY): Payer: Self-pay

## 2018-07-27 ENCOUNTER — Ambulatory Visit: Payer: Self-pay | Admitting: Physician Assistant

## 2019-03-13 ENCOUNTER — Other Ambulatory Visit: Payer: Self-pay | Admitting: Physician Assistant

## 2019-03-28 ENCOUNTER — Emergency Department (HOSPITAL_COMMUNITY): Payer: Self-pay

## 2019-03-28 ENCOUNTER — Encounter (HOSPITAL_COMMUNITY): Payer: Self-pay

## 2019-03-28 ENCOUNTER — Other Ambulatory Visit: Payer: Self-pay

## 2019-03-28 ENCOUNTER — Emergency Department (HOSPITAL_COMMUNITY)
Admission: EM | Admit: 2019-03-28 | Discharge: 2019-03-28 | Disposition: A | Discharge status: Home / Self Care | Payer: Self-pay | Attending: Emergency Medicine | Admitting: Emergency Medicine

## 2019-03-28 DIAGNOSIS — M25561 Pain in right knee: Secondary | ICD-10-CM | POA: Insufficient documentation

## 2019-03-28 DIAGNOSIS — F17210 Nicotine dependence, cigarettes, uncomplicated: Secondary | ICD-10-CM | POA: Insufficient documentation

## 2019-03-28 DIAGNOSIS — Z79899 Other long term (current) drug therapy: Secondary | ICD-10-CM | POA: Insufficient documentation

## 2019-03-28 DIAGNOSIS — I10 Essential (primary) hypertension: Secondary | ICD-10-CM | POA: Insufficient documentation

## 2019-03-28 MED ORDER — NAPROXEN 500 MG PO TABS: 500.0000 mg | ORAL_TABLET | Freq: Two times a day (BID) | ORAL | 0 refills | Status: DC

## 2019-03-28 NOTE — ED Triage Notes (Signed)
Pt reports was running after her grandbaby and fell last week.  C/O r knee pain since then.

## 2019-03-28 NOTE — ED Notes (Signed)
Mild swelling to right knee. Ambulatory with a limp

## 2019-03-28 NOTE — Discharge Instructions (Signed)
Apply ice packs on and off to your knee.  Wear the knee brace as needed for support, but do not wear continuously or at bedtime.  Call Dr. Ruthe Mannan office to arrange a follow-up appointment in 1 week if not improving.

## 2019-03-29 NOTE — ED Provider Notes (Addendum)
[facility] EMERGENCY DEPARTMENT Provider Note   CSN: [id number] Arrival date & time: 03/28/19  [id number]     History   Chief Complaint Chief Complaint  Patient presents with  . Knee Pain    HPI Veronica Arnold is a 48 y.o. female.     HPI  Veronica Arnold is a 48 y.o. female who presents to the Emergency Department complaining of right knee pain for after a mechanical fall that occurred one week ago.  She states that she was chasing her grandchild and fell.  Believes she may have twisted her knee as she fell.  She reports the pain in her knee has improved somewhat since her fall and she is able to bear weight and bend it, but continues to have some discomfort and swelling to the medial and lower knee.  She denies other injuries, redness, pain or numbness distal to the knee and back or hip pain.    Past Medical History:  Diagnosis Date  . High cholesterol   . Hypertension     Patient Active Problem List   Diagnosis Date Noted  . Essential hypertension 11/30/2015  . Hyperlipidemia 11/30/2015  . AP (abdominal pain) 11/30/2015  . Obesity, unspecified 11/30/2015  . Cigarette nicotine dependence without complication 11/30/2015    Past Surgical History:  Procedure Laterality Date  . HERNIA REPAIR    . TUBAL LIGATION    . TUBAL LIGATION       OB History    Gravida  3   Para  3   Term  3   Preterm      AB      Living  3     SAB      TAB      Ectopic      Multiple      Live Births               Home Medications    Prior to Admission medications   Medication Sig Start Date End Date Taking? Authorizing Provider  metoprolol tartrate (LOPRESSOR) 50 MG tablet Take 1 tablet (50 mg total) by mouth 2 (two) times daily. 06/29/18   Jacquelin HawkingMcElroy, Shannon, PA-C  naproxen (NAPROSYN) 500 MG tablet Take 1 tablet (500 mg total) by mouth 2 (two) times daily with a meal. 03/28/19   Jaquaveon Bilal, PA-C  simvastatin (ZOCOR) 40 MG tablet Take 1 tablet (40 mg total) by mouth  at bedtime. 06/29/18   Jacquelin HawkingMcElroy, Shannon, PA-C    Family History Family History  Problem Relation Age of Onset  . [name]   . Stroke Brother   . Heart attack Brother     Social History Social History   Tobacco Use  . Smoking status: Current Every Day Smoker    Packs/day: 0.50    Years: 25.00    Pack years: 12.50    Types: Cigarettes  . Smokeless tobacco: Never Used  . Tobacco comment: cut back to < 1/2 ppd  Substance Use Topics  . Alcohol use: No    Alcohol/week: 0.0 standard drinks  . Drug use: No     Allergies   Patient has no known allergies.   Review of Systems Review of Systems  Constitutional: Negative for chills and fever.  Musculoskeletal: Positive for arthralgias (rght knee pain and swelling) and joint swelling. Negative for back pain and neck pain.  Skin: Negative for color change and wound.  Neurological: Negative for dizziness, weakness and numbness.     Physical Exam  Updated Vital Signs BP (!) 179/109 (BP Location: Right Arm)   Pulse 75   Temp 98.5 F (36.9 C) (Oral)   Resp 12   Ht 4\' 11"  (1.499 m)   Wt 76.2 kg   LMP 02/20/2019 (Approximate)   SpO2 100%   BMI 33.93 kg/m   Physical Exam Vitals signs and nursing note reviewed.  Constitutional:      General: She is not in acute distress.    Appearance: She is well-developed.  HENT:     Head: Atraumatic.  Cardiovascular:     Rate and Rhythm: Normal rate and regular rhythm.     Pulses: Normal pulses.  Pulmonary:     Effort: Pulmonary effort is normal.     Breath sounds: Normal breath sounds.  Musculoskeletal:        General: Tenderness and signs of injury present. No deformity.     Comments: ttp of the medial right knee. Mild edema noted to the medial aspect of the knee.  No erythema, effusion, or step-off deformity. Mild pain on valgus and varus stress of the joint.  DP pulse brisk, distal sensation intact. Calf is soft and NT.  Skin:    General: Skin is warm and dry.      Findings: No erythema.  Neurological:     Mental Status: She is alert and oriented to person, place, and time.     Motor: No abnormal muscle tone.     Coordination: Coordination normal.      ED Treatments / Results  Labs (all labs ordered are listed, but only abnormal results are displayed) Labs Reviewed - No data to display  EKG None  Radiology Dg Knee Complete 4 Views Right  Result Date: 03/28/2019 CLINICAL DATA:  Fall 1 week ago with pain. EXAM: RIGHT KNEE - COMPLETE 4+ VIEW COMPARISON:  None. FINDINGS: There is anterior soft tissue swelling. No acute fractures are seen. No effusion. No significant degenerative changes. There is a bone island in the proximal tibia. IMPRESSION: Anterior soft tissue swelling.  No other significant abnormalities. Electronically Signed   By: Dorise Bullion III M.D   On: 03/28/2019 11:07    Procedures Procedures (including critical care time)  Medications Ordered in ED Medications - No data to display   Initial Impression / Assessment and Plan / ED Course  I have reviewed the triage vital signs and the nursing notes.  Pertinent labs & imaging results that were available during my care of the patient were reviewed by me and considered in my medical decision making (see chart for details).        Pt with knee pain secondary to mechanical fall.  Sx's improving since onset.  XR reassuring.  NV intact.  Possible sprain.  No concerning sx's for septic joint or obvious ligamentous instability.    Knee sleeve applied.  Pt agrees to conservative therapy and close orthopedic f/u in one week if needed.    Pt noted to be hypertensive, admits that she did not take her BP medication.  Agrees to take her medication when she gets home.   Final Clinical Impressions(s) / ED Diagnoses   Final diagnoses:  Acute pain of right knee    ED Discharge Orders         Ordered    naproxen (NAPROSYN) 500 MG tablet  2 times daily with meals     03/28/19 1238            Felice Hope, Lynelle Smoke, PA-C 03/29/19 2159  Pauline Ausriplett, Xandra Laramee, PA-C 03/29/19 2202    Bethann BerkshireZammit, Joseph, MD 03/30/19 [phone]

## 2019-04-02 ENCOUNTER — Ambulatory Visit: Payer: Self-pay | Admitting: Physician Assistant

## 2019-04-02 ENCOUNTER — Encounter: Payer: Self-pay | Admitting: Physician Assistant

## 2019-04-02 DIAGNOSIS — E785 Hyperlipidemia, unspecified: Secondary | ICD-10-CM

## 2019-04-02 DIAGNOSIS — F17200 Nicotine dependence, unspecified, uncomplicated: Secondary | ICD-10-CM

## 2019-04-02 DIAGNOSIS — Z91199 Patient's noncompliance with other medical treatment and regimen due to unspecified reason: Secondary | ICD-10-CM

## 2019-04-02 DIAGNOSIS — Z1239 Encounter for other screening for malignant neoplasm of breast: Secondary | ICD-10-CM

## 2019-04-02 DIAGNOSIS — Z9119 Patient's noncompliance with other medical treatment and regimen: Secondary | ICD-10-CM

## 2019-04-02 DIAGNOSIS — I10 Essential (primary) hypertension: Secondary | ICD-10-CM

## 2019-04-02 MED ORDER — METOPROLOL TARTRATE 50 MG PO TABS: 50.0000 mg | ORAL_TABLET | Freq: Two times a day (BID) | ORAL | 0 refills | Status: DC

## 2019-04-02 MED ORDER — SIMVASTATIN 40 MG PO TABS: 40.0000 mg | ORAL_TABLET | Freq: Every day | ORAL | 0 refills | Status: DC

## 2019-04-02 NOTE — Progress Notes (Signed)
   There were no vitals taken for this visit.   Subjective:    Patient ID: Veronica Arnold, female    DOB: 1970-11-24, 48 y.o.   MRN: [id number]  HPI: Veronica Arnold is a 48 y.o. female presenting on 04/02/2019 for Follow-up   HPI    This is a telemedicine appointment due to coronavirus pandemic.  It is via telephone as pt does not have a cellular telephone with video capabilities  I connected with  Veronica Arnold on [birth year] by a video enabled telemedicine application and verified that I am speaking with the correct person using two identifiers.   I discussed the limitations of evaluation and management by telemedicine. The patient expressed understanding and agreed to proceed.  Pt is at home.  Provider is at office    Pt was Last seen here in October.  She was scheduled for follow up in 1 month but she never came back in.    She says that she is still taking her metoprolol but her simvastatin recently ran out.  She says she She checks her bp at home-  Today  121/something.  She says she is doing well and she has no complaints.  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  She is still smoking.   Relevant past medical, surgical, family and social history reviewed and updated as indicated. Interim medical history since our last visit reviewed. Allergies and medications reviewed and updated.  CURRENT MEDS: Metoprolol 50mg  bid Not taking- simvastatin 40mg  qhs    Review of Systems  Per HPI unless specifically indicated above     Objective:    There were no vitals taken for this visit.  Wt Readings from Last 3 Encounters:  03/28/19 168 lb (76.2 kg)  06/29/18 184 lb 8 oz (83.7 kg)  02/23/18 177 lb (80.3 kg)    Physical Exam Cardiovascular:     Heart sounds: No murmur.  Neurological:     Mental Status: She is alert and oriented to person, place, and time.  Psychiatric:        Attention and Perception: Attention normal.        Speech: Speech normal.      Behavior: Behavior is cooperative.           Assessment & Plan:     Encounter Diagnoses  Name Primary?  . Essential hypertension Yes  . Hyperlipidemia, unspecified hyperlipidemia type   . Tobacco use disorder   . Screening for breast cancer   . Personal history of noncompliance with medical treatment, presenting hazards to health      -Will Update labs-  Pt Will be called with results -will refer for screening Mammogram -PAP UTD until summer 2021 -encouraged smoking cessation -encouraged pt to continue to wear a mask when she goes out in public -pt to follow up in 3 months.  She is to contact office sooner prn

## 2019-04-05 ENCOUNTER — Other Ambulatory Visit: Payer: Self-pay | Admitting: Physician Assistant

## 2019-04-05 DIAGNOSIS — Z1239 Encounter for other screening for malignant neoplasm of breast: Secondary | ICD-10-CM

## 2019-04-23 ENCOUNTER — Other Ambulatory Visit: Payer: Self-pay

## 2019-04-23 ENCOUNTER — Ambulatory Visit (HOSPITAL_COMMUNITY)
Admission: RE | Admit: 2019-04-23 | Discharge: 2019-04-23 | Disposition: A | Discharge status: Home / Self Care | Payer: Self-pay | Source: Ambulatory Visit | Attending: Physician Assistant | Admitting: Physician Assistant

## 2019-04-23 DIAGNOSIS — Z1239 Encounter for other screening for malignant neoplasm of breast: Secondary | ICD-10-CM | POA: Insufficient documentation

## 2019-05-15 ENCOUNTER — Other Ambulatory Visit: Payer: Self-pay | Admitting: Physician Assistant

## 2019-05-16 ENCOUNTER — Other Ambulatory Visit: Payer: Self-pay | Admitting: Physician Assistant

## 2019-05-16 MED ORDER — SIMVASTATIN 40 MG PO TABS: 40.0000 mg | ORAL_TABLET | Freq: Every day | ORAL | 1 refills | Status: DC

## 2019-07-02 ENCOUNTER — Ambulatory Visit: Payer: Self-pay | Admitting: Physician Assistant

## 2019-07-03 ENCOUNTER — Other Ambulatory Visit: Payer: Self-pay

## 2019-07-03 ENCOUNTER — Other Ambulatory Visit (HOSPITAL_COMMUNITY)
Admission: RE | Admit: 2019-07-03 | Discharge: 2019-07-03 | Disposition: A | Discharge status: Home / Self Care | Payer: Self-pay | Source: Ambulatory Visit | Attending: Physician Assistant | Admitting: Physician Assistant

## 2019-07-03 DIAGNOSIS — E785 Hyperlipidemia, unspecified: Secondary | ICD-10-CM | POA: Insufficient documentation

## 2019-07-03 DIAGNOSIS — I10 Essential (primary) hypertension: Secondary | ICD-10-CM | POA: Insufficient documentation

## 2019-07-03 LAB — COMPREHENSIVE METABOLIC PANEL
ALT: 15 U/L (ref 0–44)
AST: 16 U/L (ref 15–41)
Albumin: 3.7 g/dL (ref 3.5–5.0)
Alkaline Phosphatase: 68 U/L (ref 38–126)
Anion gap: 8 (ref 5–15)
BUN: 11 mg/dL (ref 6–20)
CO2: 26 mmol/L (ref 22–32)
Calcium: 9 mg/dL (ref 8.9–10.3)
Chloride: 106 mmol/L (ref 98–111)
Creatinine, Ser: 0.74 mg/dL (ref 0.44–1.00)
GFR calc Af Amer: >60 mL/min (ref >60)
GFR calc non Af Amer: >60 mL/min (ref >60)
Glucose, Bld: 97 mg/dL (ref 70–99)
Potassium: 3.4 mmol/L — ABNORMAL LOW (ref 3.5–5.1)
Sodium: 140 mmol/L (ref 135–145)
Total Bilirubin: 0.5 mg/dL (ref 0.3–1.2)
Total Protein: 7.2 g/dL (ref 6.5–8.1)

## 2019-07-03 LAB — LIPID PANEL
Cholesterol: 240 mg/dL — ABNORMAL HIGH (ref 0–200)
HDL: 52 mg/dL (ref >40)
LDL Cholesterol: 174 mg/dL — ABNORMAL HIGH (ref 0–99)
Total CHOL/HDL Ratio: 4.6 RATIO
Triglycerides: 72 mg/dL (ref <150)
VLDL: 14 mg/dL (ref 0–40)

## 2019-07-04 ENCOUNTER — Other Ambulatory Visit: Payer: Self-pay

## 2019-07-04 ENCOUNTER — Encounter: Payer: Self-pay | Admitting: Physician Assistant

## 2019-07-04 ENCOUNTER — Ambulatory Visit: Payer: Self-pay | Admitting: Physician Assistant

## 2019-07-04 VITALS — BP 143/82 | HR 61 | Temp 97.5°F

## 2019-07-04 DIAGNOSIS — I10 Essential (primary) hypertension: Secondary | ICD-10-CM

## 2019-07-04 DIAGNOSIS — F17200 Nicotine dependence, unspecified, uncomplicated: Secondary | ICD-10-CM

## 2019-07-04 DIAGNOSIS — E785 Hyperlipidemia, unspecified: Secondary | ICD-10-CM

## 2019-07-04 NOTE — Progress Notes (Signed)
BP (!) 143/82   Pulse 61   Temp (!) 97.5 F (36.4 C)   SpO2 100%    Subjective:    Patient ID: Veronica Arnold, female    DOB: 08/30/1971, 48 y.o.   MRN: [id number]  HPI: Veronica Arnold is a 48 y.o. female presenting on 07/04/2019 for No chief complaint on file.   HPI   Patient had a negative COVID-19 screening questionnaire.   Pt checks her bp at home, usually 2/wk. she says that the other day it was "120-something".  She says it is never up to the 140s at home.  She has been out of her simvasttin for about a week.   Patient says she is feeling great.  She says she has no complaints today.  Relevant past medical, surgical, family and social history reviewed and updated as indicated. Interim medical history since our last visit reviewed. Allergies and medications reviewed and updated.   Current Outpatient Medications:  .  metoprolol tartrate (LOPRESSOR) 50 MG tablet, Take 1 tablet (50 mg total) by mouth 2 (two) times daily., Disp: 60 tablet, Rfl: 0 .  simvastatin (ZOCOR) 40 MG tablet, Take 1 tablet (40 mg total) by mouth at bedtime. (Patient not taking: Reported on 07/04/2019), Disp: 30 tablet, Rfl: 1   Review of Systems  Per HPI unless specifically indicated above     Objective:    BP (!) 143/82   Pulse 61   Temp (!) 97.5 F (36.4 C)   SpO2 100%   Wt Readings from Last 3 Encounters:  03/28/19 168 lb (76.2 kg)  06/29/18 184 lb 8 oz (83.7 kg)  02/23/18 177 lb (80.3 kg)    Physical Exam Vitals signs reviewed.  Constitutional:      Appearance: She is well-developed.  HENT:     Head: Normocephalic and atraumatic.  Neck:     Musculoskeletal: Neck supple.  Cardiovascular:     Rate and Rhythm: Normal rate and regular rhythm.  Pulmonary:     Effort: Pulmonary effort is normal.     Breath sounds: Normal breath sounds.  Abdominal:     General: Bowel sounds are normal.     Palpations: Abdomen is soft. There is no mass.     Tenderness: There is no abdominal  tenderness.  Lymphadenopathy:     Cervical: No cervical adenopathy.  Skin:    General: Skin is warm and dry.  Neurological:     Mental Status: She is alert and oriented to person, place, and time.  Psychiatric:        Attention and Perception: Attention normal.        Speech: Speech normal.        Behavior: Behavior normal. Behavior is cooperative.     Results for orders placed or performed during the hospital encounter of 07/03/19  Comprehensive metabolic panel  Result Value Ref Range   Sodium 140 135 - 145 mmol/L   Potassium 3.4 (L) 3.5 - 5.1 mmol/L   Chloride 106 98 - 111 mmol/L   CO2 26 22 - 32 mmol/L   Glucose, Bld 97 70 - 99 mg/dL   BUN 11 6 - 20 mg/dL   Creatinine, Ser 7.67 0.44 - 1.00 mg/dL   Calcium 9.0 8.9 - 20.9 mg/dL   Total Protein 7.2 6.5 - 8.1 g/dL   Albumin 3.7 3.5 - 5.0 g/dL   AST 16 15 - 41 U/L   ALT 15 0 - 44 U/L   Alkaline Phosphatase 68  38 - 126 U/L   Total Bilirubin 0.5 0.3 - 1.2 mg/dL   GFR calc non Af Amer >60 >60 mL/min   GFR calc Af Amer >60 >60 mL/min   Anion gap 8 5 - 15  Lipid panel  Result Value Ref Range   Cholesterol 240 (H) 0 - 200 mg/dL   Triglycerides 72 <150 mg/dL   HDL 52 >40 mg/dL   Total CHOL/HDL Ratio 4.6 RATIO   VLDL 14 0 - 40 mg/dL   LDL Cholesterol 174 (H) 0 - 99 mg/dL      Assessment & Plan:    1.  Hypertension  Reviewed labs with patient. Discussed with patient that her blood pressure is elevated in office today.  Patient is quite certain that her blood pressure is good when she monitors it at home.  Will not change blood pressure medication at this time.  She will continue metoprolol 50 mg twice daily for the blood pressure.  She is to continue to monitor it at home and notify office if her readings start getting high.  2.  Hyperlipidemia  Patient is to get back on her simvastatin.  She is to follow a low-fat diet and exercise regularly.  Counseled patient that she should avoid running out of her medication as her  blood results clearly show that what happens when she runs out of her medication.  3.  Tobacco use disorder  Counseled patient on smoking cessation.   4.  Healthcare maintenance  Patient has already received influenza immunization this season.  Patient's Pap smear will be updated at her follow-up appointment in 3 to 4 months.  Patient is to contact office sooner if needed.

## 2019-08-16 ENCOUNTER — Other Ambulatory Visit: Payer: Self-pay | Admitting: Physician Assistant

## 2019-10-31 ENCOUNTER — Ambulatory Visit: Payer: Self-pay | Admitting: Physician Assistant

## 2019-11-08 ENCOUNTER — Ambulatory Visit: Payer: Self-pay | Admitting: Physician Assistant

## 2019-11-15 ENCOUNTER — Ambulatory Visit: Payer: Self-pay | Admitting: Physician Assistant

## 2019-11-16 ENCOUNTER — Other Ambulatory Visit: Payer: Self-pay | Admitting: Physician Assistant

## 2019-11-29 ENCOUNTER — Other Ambulatory Visit: Payer: Self-pay

## 2019-11-29 ENCOUNTER — Emergency Department (HOSPITAL_COMMUNITY)
Admission: EM | Admit: 2019-11-29 | Discharge: 2019-11-29 | Disposition: A | Discharge status: Home / Self Care | Payer: Self-pay | Attending: Emergency Medicine | Admitting: Emergency Medicine

## 2019-11-29 ENCOUNTER — Encounter (HOSPITAL_COMMUNITY): Payer: Self-pay | Admitting: *Deleted

## 2019-11-29 DIAGNOSIS — F17210 Nicotine dependence, cigarettes, uncomplicated: Secondary | ICD-10-CM | POA: Insufficient documentation

## 2019-11-29 DIAGNOSIS — I10 Essential (primary) hypertension: Secondary | ICD-10-CM | POA: Insufficient documentation

## 2019-11-29 DIAGNOSIS — Z79899 Other long term (current) drug therapy: Secondary | ICD-10-CM | POA: Insufficient documentation

## 2019-11-29 DIAGNOSIS — K047 Periapical abscess without sinus: Secondary | ICD-10-CM | POA: Insufficient documentation

## 2019-11-29 MED ORDER — CLINDAMYCIN HCL 300 MG PO CAPS: 300.0000 mg | ORAL_CAPSULE | Freq: Three times a day (TID) | ORAL | 0 refills | Status: AC

## 2019-11-29 MED ORDER — DICLOFENAC SODIUM 50 MG PO TBEC: 50.0000 mg | DELAYED_RELEASE_TABLET | Freq: Two times a day (BID) | ORAL | 0 refills | Status: DC

## 2019-11-29 NOTE — ED Provider Notes (Signed)
Veronica Arnold   CSN: [id number] Arrival date & time: 11/29/19  1213     History Chief Complaint  Patient presents with  . Dental Pain    Veronica Arnold is a 49 y.o. female.  The history is provided by the patient. No language interpreter was used.  Dental Pain Location:  Lower Lower teeth location:  31/RL 2nd molar and 32/RL 3rd molar Quality:  Aching Onset quality:  Gradual Timing:  Constant Progression:  Worsening Chronicity:  New Relieved by:  Nothing Worsened by:  Nothing Associated symptoms: facial pain   Associated symptoms: no congestion        Past Medical History:  Diagnosis Date  . High cholesterol   . Hypertension     Patient Active Problem List   Diagnosis Date Noted  . Essential hypertension 11/30/2015  . Hyperlipidemia 11/30/2015  . AP (abdominal pain) 11/30/2015  . Obesity, unspecified 11/30/2015  . Cigarette nicotine dependence without complication 11/30/2015    Past Surgical History:  Procedure Laterality Date  . HERNIA REPAIR    . TUBAL LIGATION    . TUBAL LIGATION       OB History    Gravida  3   Para  3   Term  3   Preterm      AB      Living  3     SAB      TAB      Ectopic      Multiple      Live Births              Family History  Problem Relation Age of Onset  . [name]   . Stroke Brother   . Heart attack Brother     Social History   Tobacco Use  . Smoking status: Current Every Day Smoker    Packs/day: 0.50    Years: 25.00    Pack years: 12.50    Types: Cigarettes  . Smokeless tobacco: Never Used  . Tobacco comment: cut back to < 1/2 ppd  Substance Use Topics  . Alcohol use: No    Alcohol/week: 0.0 standard drinks  . Drug use: No    Home Medications Prior to Admission medications   Medication Sig Start Date End Date Taking? Authorizing Provider  clindamycin (CLEOCIN) 300 MG capsule Take 1 capsule (300 mg total) by mouth 3 (three) times daily  for 10 days. 11/29/19 12/09/19  Elson Areas, PA-C  diclofenac (VOLTAREN) 50 MG EC tablet Take 1 tablet (50 mg total) by mouth 2 (two) times daily. 11/29/19   Elson Areas, PA-C  metoprolol tartrate (LOPRESSOR) 50 MG tablet Take 1 tablet by mouth twice daily 11/16/19   Jacquelin Hawking, PA-C  simvastatin (ZOCOR) 40 MG tablet TAKE 1 TABLET BY MOUTH AT BEDTIME 08/16/19   Jacquelin Hawking, PA-C    Allergies    Patient has no known allergies.  Review of Systems   Review of Systems  HENT: Negative for congestion.   All other systems reviewed and are negative.   Physical Exam Updated Vital Signs BP 131/78 (BP Location: Right Arm)   Pulse 76   Temp 98.3 F (36.8 C) (Oral)   Resp 16   Ht 4\' 11"  (1.499 m)   Wt 79.4 kg   SpO2 100%   BMI 35.35 kg/m   Physical Exam Vitals reviewed.  HENT:     Head: Normocephalic.     Nose: Nose normal.  Mouth/Throat:     Mouth: Mucous membranes are moist.     Comments: Swelling gumline  Cardiovascular:     Rate and Rhythm: Normal rate.     Pulses: Normal pulses.  Pulmonary:     Effort: Pulmonary effort is normal.  Musculoskeletal:        General: Normal range of motion.  Skin:    General: Skin is warm.  Neurological:     General: No focal deficit present.     Mental Status: She is alert.  Psychiatric:        Mood and Affect: Mood normal.     ED Results / Procedures / Treatments   Labs (all labs ordered are listed, but only abnormal results are displayed) Labs Reviewed - No data to display  EKG None  Radiology No results found.  Procedures Procedures (including critical care time)  Medications Ordered in ED Medications - No data to display  ED Course  I have reviewed the triage vital signs and the nursing notes.  Pertinent labs & imaging results that were available during my care of the patient were reviewed by me and considered in my medical decision making (see chart for details).    MDM Rules/Calculators/A&P                       MDM  Pt given rx for clindamycin and voltaren Final Clinical Impression(s) / ED Diagnoses Final diagnoses:  Dental abscess    Rx / DC Orders ED Discharge Orders         Ordered    clindamycin (CLEOCIN) 300 MG capsule  3 times daily     11/29/19 1318    diclofenac (VOLTAREN) 50 MG EC tablet  2 times daily     11/29/19 [address] 11/29/19 1631    Elnora Morrison, MD 12/01/19 [phone]

## 2019-11-29 NOTE — Discharge Instructions (Signed)
Return if any problems.

## 2019-11-29 NOTE — ED Triage Notes (Signed)
Pt with right lower dental pain since yesterday.

## 2019-12-27 ENCOUNTER — Ambulatory Visit: Payer: Self-pay | Attending: Internal Medicine

## 2019-12-27 DIAGNOSIS — Z23 Encounter for immunization: Secondary | ICD-10-CM

## 2019-12-27 NOTE — Progress Notes (Signed)
   Covid-19 Vaccination Clinic  Name:  Veronica Arnold    MRN: [id number] DOB: [birth year]  12/27/2019  [name] was observed post Covid-19 immunization for 15 minutes without incident. She was provided with Vaccine Information Sheet and instruction to access the V-Safe system.   [name] was instructed to call 911 with any severe reactions post vaccine: Marland Kitchen Difficulty breathing  . Swelling of face and throat  . A fast heartbeat  . A bad rash all over body  . Dizziness and weakness   Immunizations Administered    Name Date Dose VIS Date Route   Moderna COVID-19 Vaccine 12/27/2019  9:05 AM 0.5 mL 08/2019 Intramuscular   Manufacturer: Moderna   Lot: [id number]   NDC: 27670-110-03

## 2020-01-29 ENCOUNTER — Ambulatory Visit: Payer: Self-pay | Attending: Internal Medicine

## 2020-01-29 DIAGNOSIS — Z23 Encounter for immunization: Secondary | ICD-10-CM

## 2020-01-29 NOTE — Progress Notes (Signed)
   Covid-19 Vaccination Clinic  Name:  Veronica Arnold    MRN: [id number] DOB: 22-Mar-1971  01/29/2020  [name] was observed post Covid-19 immunization for 15 minutes without incident. She was provided with Vaccine Information Sheet and instruction to access the V-Safe system.   [name] was instructed to call 911 with any severe reactions post vaccine: Marland Kitchen Difficulty breathing  . Swelling of face and throat  . A fast heartbeat  . A bad rash all over body  . Dizziness and weakness   Immunizations Administered    Name Date Dose VIS Date Route   Moderna COVID-19 Vaccine 01/29/2020  9:45 AM 0.5 mL 08/2019 Intramuscular   Manufacturer: Moderna   Lot: [id number]   NDC: 17711-657-90

## 2020-02-14 ENCOUNTER — Other Ambulatory Visit: Payer: Self-pay | Admitting: Physician Assistant

## 2020-02-27 ENCOUNTER — Other Ambulatory Visit: Payer: Self-pay

## 2020-02-27 ENCOUNTER — Encounter: Payer: Self-pay | Admitting: Physician Assistant

## 2020-02-27 ENCOUNTER — Ambulatory Visit: Payer: Self-pay | Admitting: Physician Assistant

## 2020-02-27 VITALS — BP 140/90 | HR 58 | Temp 97.7°F | Ht 60.0 in | Wt 170.5 lb

## 2020-02-27 DIAGNOSIS — F17200 Nicotine dependence, unspecified, uncomplicated: Secondary | ICD-10-CM

## 2020-02-27 DIAGNOSIS — I10 Essential (primary) hypertension: Secondary | ICD-10-CM

## 2020-02-27 DIAGNOSIS — E785 Hyperlipidemia, unspecified: Secondary | ICD-10-CM

## 2020-02-27 DIAGNOSIS — Z91199 Patient's noncompliance with other medical treatment and regimen due to unspecified reason: Secondary | ICD-10-CM

## 2020-02-27 MED ORDER — METOPROLOL TARTRATE 50 MG PO TABS: 50.0000 mg | ORAL_TABLET | Freq: Two times a day (BID) | ORAL | 0 refills | Status: DC

## 2020-02-27 MED ORDER — SIMVASTATIN 40 MG PO TABS: 40.0000 mg | ORAL_TABLET | Freq: Every day | ORAL | 0 refills | Status: DC

## 2020-02-27 NOTE — Progress Notes (Signed)
   BP 140/90   Pulse (!) 58   Temp 97.7 F (36.5 C)   Ht 5' (1.524 m)   Wt 170 lb 8 oz (77.3 kg)   SpO2 98%   BMI 33.30 kg/m    Subjective:    Patient ID: Veronica Arnold, female    DOB: 1971-04-27, 49 y.o.   MRN: [id number]  HPI: Veronica Arnold is a 49 y.o. female presenting on 02/27/2020 for Hypertension and Hyperlipidemia   HPI    Pt had a negative covid 19 screening questionnaire.    Pt was scheduled to follow up in February but is just now coming in for follow up.  sHe says she has insurance now.  She says she is doing well.    She got both of her covid vaccinations.      Relevant past medical, surgical, family and social history reviewed and updated as indicated. Interim medical history since our last visit reviewed. Allergies and medications reviewed and updated.  CURRENT MEDS: Metoprolol 50mg  bid Diclofenac  Not taking- simvastatin 40  Review of Systems  Per HPI unless specifically indicated above     Objective:    BP 140/90   Pulse (!) 58   Temp 97.7 F (36.5 C)   Ht 5' (1.524 m)   Wt 170 lb 8 oz (77.3 kg)   SpO2 98%   BMI 33.30 kg/m   Wt Readings from Last 3 Encounters:  02/27/20 170 lb 8 oz (77.3 kg)  11/29/19 175 lb (79.4 kg)  03/28/19 168 lb (76.2 kg)    Physical Exam Constitutional:      General: She is not in acute distress.    Appearance: She is obese. She is not toxic-appearing.  HENT:     Head: Normocephalic and atraumatic.  Pulmonary:     Effort: Pulmonary effort is normal. No respiratory distress.  Neurological:     Mental Status: She is alert and oriented to person, place, and time.  Psychiatric:        Attention and Perception: Attention normal.        Speech: Speech normal.        Behavior: Behavior is cooperative.            Assessment & Plan:     Encounter Diagnoses  Name Primary?  . Essential hypertension Yes  . Hyperlipidemia, unspecified hyperlipidemia type   . Tobacco use disorder   . Personal  history of noncompliance with medical treatment, presenting hazards to health       -after discussing with pt that she is no longer eligible to be seen at [facility] now that she has insurance, she says that it only covers her teeth.  She doesn't have the card with her.  She is encouraged to bring the card for [facility] to look at to see if it covers medical.  If it does, she will need to establish with new PCP.  If not, she will be scheduled to follow up and get her labs updated.  She states understanding.

## 2020-04-02 ENCOUNTER — Other Ambulatory Visit (HOSPITAL_COMMUNITY): Payer: Self-pay | Admitting: Physician Assistant

## 2020-04-02 DIAGNOSIS — Z1231 Encounter for screening mammogram for malignant neoplasm of breast: Secondary | ICD-10-CM

## 2020-04-25 ENCOUNTER — Other Ambulatory Visit: Payer: Self-pay

## 2020-04-25 ENCOUNTER — Ambulatory Visit (HOSPITAL_COMMUNITY)
Admission: RE | Admit: 2020-04-25 | Discharge: 2020-04-25 | Disposition: A | Discharge status: Home / Self Care | Payer: Self-pay | Source: Ambulatory Visit | Attending: Physician Assistant | Admitting: Physician Assistant

## 2020-04-25 DIAGNOSIS — Z1231 Encounter for screening mammogram for malignant neoplasm of breast: Secondary | ICD-10-CM | POA: Insufficient documentation

## 2020-09-08 ENCOUNTER — Other Ambulatory Visit: Payer: Self-pay | Admitting: Physician Assistant

## 2021-01-07 ENCOUNTER — Other Ambulatory Visit: Payer: Self-pay | Admitting: Physician Assistant

## 2021-01-08 ENCOUNTER — Telehealth: Payer: Self-pay

## 2021-01-08 NOTE — Telephone Encounter (Signed)
Entry for 01/07/21  Previous client of [facility] called and talked with Eston Mould regarding renewing her CC to establish medical care with [facility]. Cheri reviewed documents needed and client set an earliest appointment for her to have documents for 01/14/21. However, client was previously at [facility] not seen in a year due to having insurance. she states she no longer has insurance, but has now been without her blood pressure medication for about a week.   This CM called client back and discussed options. Client is unable to have all documents available for screening to enroll in [facility], however appointment made for client to come to [facility] on 01/08/21 at 10am in order to have a medical assessment and blood pressure check along with an MD live session in hopes of refilling her medication to bridge her until all [facility] screenings are in place and we are able to assist her in securing an appointment to re-establish care with [facility]. Client is agreeable. she is to bring her medication bottle to her visit 01/08/21. Discussed this case with [facility] L[facility]. An MD live pre-registration performed and tentative virtual visit arranged for 01/08/21.  Plan:* Blood pressure check and brief Medical history 01/08/21 *MD live visit for antihypertensive medication refill  *If unable to complete an MD Live visit will plan to consult with Dr. Windell Norfolk Medical Director for assistance if needed.  Will continue to follow client as needed for further needs and assistance.  Francee Nodal RN [employer]

## 2021-01-08 NOTE — Congregational Nurse Program (Signed)
Pt attended [facility] on today to receive assistance with getting a medication refill until she can get re-established with [facility] provider.  States she no longer has health insurance from her employer.     States she recently ran out of her BP and cholesterol meds about 4-5 days ago.  Denies any hypertensive crisis symptoms   Vitals signs checked (see vitals section)  MD LIVE visit was initiated, however pt forgot to bring her BP and cholesterol empty bottles,as previously advised by P.Gilley, RN on 5.4.22  MD Live provider will resume the pt's televisit on today after she takes a photo of the bottles.  Pt was advised to return back to [facility] to assist with resuming of televisit   Pt also completed a [facility] application while waiting to get on MD Live visit, but did not bring any of the requested docs, but will bring them back on before her 5.11.22. appt with [facility] eligibility/enrollment representative.

## 2021-01-09 ENCOUNTER — Other Ambulatory Visit: Payer: Self-pay | Admitting: Critical Care Medicine

## 2021-01-09 MED ORDER — METOPROLOL TARTRATE 50 MG PO TABS: 50.0000 mg | ORAL_TABLET | Freq: Two times a day (BID) | ORAL | 2 refills | Status: DC

## 2021-01-09 NOTE — Congregational Nurse Program (Signed)
Client came in today to [facility], she had attempted previous day 01/08/21 do have an MD Live visit with [facility] L[facility]. Client did not have pill bottle, so MD live provider had arranged to send client a link so she could send a picture and resume visit.  Client reports she could not open link sent.  She brought medication bottle in today and has been taking Metoprolol 50 mg 1 tablet by mouth two times a day.  Reviewed blood pressure from 01/08/21 as client came by briefly from work and is unable to stay longer.  Called Dr. Windell Norfolk Medical Director. He will send prescription for medication to [facility].   Plan: Client began [facility] application and screening for referral to [facility] in [place]. She has some financial documents to bring in to complete process for eligibility. She has scheduled to bring those documents to [facility] on 01/14/21.   *called client to inform her that her prescription would be sent to [facility] by Dr. Shan Levans until she can [facility].  Francee Nodal RN [employer]

## 2021-01-09 NOTE — Progress Notes (Addendum)
Refilled metoprolol per request RN Norval Gable CN

## 2021-01-14 ENCOUNTER — Telehealth: Payer: Self-pay

## 2021-01-14 NOTE — Telephone Encounter (Signed)
Client given appointment to re-establish medical care with [facility] Appointment 01/19/21 at 0900. Discussed with client to wear mask and to bring her medications with her to the appointment. [facility] is aware of how [facility] and address. Reminded client to call 24 hours in advance if she would need to cancel her appointment. Discussed Co-payment with client and she states understanding.  Client needs to bring in some more financial documents to complete MedAssist application: Eston Mould discussed needed documents in order to send medassist application for review.    Francee Nodal RN [employer]

## 2021-01-19 ENCOUNTER — Ambulatory Visit: Payer: Self-pay | Admitting: Physician Assistant

## 2021-01-21 ENCOUNTER — Encounter: Payer: Self-pay | Admitting: Physician Assistant

## 2021-01-21 ENCOUNTER — Other Ambulatory Visit: Payer: Self-pay

## 2021-01-21 ENCOUNTER — Telehealth: Payer: Self-pay | Admitting: Physician Assistant

## 2021-01-21 ENCOUNTER — Ambulatory Visit: Payer: Self-pay | Admitting: Physician Assistant

## 2021-01-21 VITALS — BP 161/80 | HR 63 | Temp 97.2°F

## 2021-01-21 DIAGNOSIS — Z131 Encounter for screening for diabetes mellitus: Secondary | ICD-10-CM

## 2021-01-21 DIAGNOSIS — I10 Essential (primary) hypertension: Secondary | ICD-10-CM

## 2021-01-21 DIAGNOSIS — E785 Hyperlipidemia, unspecified: Secondary | ICD-10-CM

## 2021-01-21 DIAGNOSIS — Z7689 Persons encountering health services in other specified circumstances: Secondary | ICD-10-CM

## 2021-01-21 DIAGNOSIS — F17200 Nicotine dependence, unspecified, uncomplicated: Secondary | ICD-10-CM

## 2021-01-21 MED ORDER — SIMVASTATIN 40 MG PO TABS: 40.0000 mg | ORAL_TABLET | Freq: Every day | ORAL | 3 refills | Status: DC

## 2021-01-21 MED ORDER — METOPROLOL TARTRATE 50 MG PO TABS: 50.0000 mg | ORAL_TABLET | Freq: Two times a day (BID) | ORAL | 3 refills | Status: DC

## 2021-01-21 NOTE — Progress Notes (Signed)
161  BP (!) 161/80   Pulse 63   Temp (!) 97.2 F (36.2 C)   SpO2 98%    Subjective:    Patient ID: Veronica Arnold, female    DOB: 20-May-1971, 50 y.o.   MRN: [id number]  HPI: Veronica Arnold is a 50 y.o. female presenting on 01/21/2021 for No chief complaint on file.   HPI  Pt had a negative covid 19 screening questionnaire.    Pt is 49yoF who presents to re-establish care. She was last seen here 02/27/20 because she had gotten insurance.   She says she doesn't have insurance.  She says she is taking the metoprolol bid but that she didn't take it this morning.  She has a machine at home but hasn't used it lately.  She is still working at [employer] as a Engineer, structural.  She says she is doing good.  She has some back pain that is the same that she has had in the past.  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   She says she got her PAP updated when she had insurance at the health dept  She says she just ran out of her simvastatin a day or two ago.     Relevant past medical, surgical, family and social history reviewed and updated as indicated. Interim medical history since our last visit reviewed. Allergies and medications reviewed and updated.   Current Outpatient Medications:  .  metoprolol tartrate (LOPRESSOR) 50 MG tablet, Take 1 tablet (50 mg total) by mouth 2 (two) times daily., Disp: 60 tablet, Rfl: 2    Review of Systems  Per HPI unless specifically indicated above     Objective:    BP (!) 161/80   Pulse 63   Temp (!) 97.2 F (36.2 C)   SpO2 98%   Wt Readings from Last 3 Encounters:  01/08/21 174 lb 6.4 oz (79.1 kg)  02/27/20 170 lb 8 oz (77.3 kg)  11/29/19 175 lb (79.4 kg)    Physical Exam Vitals reviewed.  Constitutional:      General: She is not in acute distress.    Appearance: She is well-developed. She is not ill-appearing.  HENT:     Head: Normocephalic and atraumatic.     Right Ear: Tympanic membrane and ear canal normal.     Left Ear: Tympanic  membrane and ear canal normal.  Eyes:     Conjunctiva/sclera: Conjunctivae normal.     Pupils: Pupils are equal, round, and reactive to light.  Neck:     Thyroid: No thyromegaly.  Cardiovascular:     Rate and Rhythm: Normal rate and regular rhythm.  Pulmonary:     Effort: Pulmonary effort is normal.     Breath sounds: Normal breath sounds.  Abdominal:     General: Bowel sounds are normal.     Palpations: Abdomen is soft. There is no mass.     Tenderness: There is no abdominal tenderness.  Musculoskeletal:     Cervical back: Neck supple.     Right lower leg: No edema.     Left lower leg: No edema.  Lymphadenopathy:     Cervical: No cervical adenopathy.  Skin:    General: Skin is warm and dry.  Neurological:     Mental Status: She is alert and oriented to person, place, and time.     Motor: No weakness or tremor.     Gait: Gait normal.  Psychiatric:        Attention and  Perception: Attention normal.        Speech: Speech normal.        Behavior: Behavior normal. Behavior is cooperative.             Assessment & Plan:   Encounter Diagnoses  Name Primary?  . Encounter to establish care Yes  . Essential hypertension   . Hyperlipidemia, unspecified hyperlipidemia type   . Tobacco use disorder   . Screening for diabetes mellitus      -will Update labs and pt will be called with results -Record request sent to [facility] for PAP and mammogram -continue Metoprolol and simvastatin -Pt to check bp at home.  If okay (< 140) she will fllow up in 3 months.  If still high (>140), she is to follow up 1 month.  Pt is in agreement with plan

## 2021-01-21 NOTE — Telephone Encounter (Signed)
Record request sent to [facility] for mammogram and PAP report.  Reply from that office is that pt was never seen at that facility/no records for PAP or mammogram

## 2021-01-28 ENCOUNTER — Other Ambulatory Visit (HOSPITAL_COMMUNITY)
Admission: RE | Admit: 2021-01-28 | Discharge: 2021-01-28 | Disposition: A | Discharge status: Home / Self Care | Payer: Self-pay | Source: Ambulatory Visit | Attending: Physician Assistant | Admitting: Physician Assistant

## 2021-01-28 ENCOUNTER — Other Ambulatory Visit: Payer: Self-pay

## 2021-01-28 DIAGNOSIS — I10 Essential (primary) hypertension: Secondary | ICD-10-CM

## 2021-01-28 DIAGNOSIS — Z131 Encounter for screening for diabetes mellitus: Secondary | ICD-10-CM

## 2021-01-28 DIAGNOSIS — E785 Hyperlipidemia, unspecified: Secondary | ICD-10-CM

## 2021-01-28 LAB — COMPREHENSIVE METABOLIC PANEL
ALT: 14 U/L (ref 0–44)
AST: 17 U/L (ref 15–41)
Albumin: 3.4 g/dL — ABNORMAL LOW (ref 3.5–5.0)
Alkaline Phosphatase: 80 U/L (ref 38–126)
Anion gap: 7 (ref 5–15)
BUN: 9 mg/dL (ref 6–20)
CO2: 27 mmol/L (ref 22–32)
Calcium: 8.5 mg/dL — ABNORMAL LOW (ref 8.9–10.3)
Chloride: 105 mmol/L (ref 98–111)
Creatinine, Ser: 0.62 mg/dL (ref 0.44–1.00)
GFR, Estimated: >60 mL/min (ref >60)
Glucose, Bld: 113 mg/dL — ABNORMAL HIGH (ref 70–99)
Potassium: 3.2 mmol/L — ABNORMAL LOW (ref 3.5–5.1)
Sodium: 139 mmol/L (ref 135–145)
Total Bilirubin: 0.5 mg/dL (ref 0.3–1.2)
Total Protein: 6.6 g/dL (ref 6.5–8.1)

## 2021-01-28 LAB — LIPID PANEL
Cholesterol: 232 mg/dL — ABNORMAL HIGH (ref 0–200)
HDL: 56 mg/dL (ref >40)
LDL Cholesterol: 143 mg/dL — ABNORMAL HIGH (ref 0–99)
Total CHOL/HDL Ratio: 4.1 RATIO
Triglycerides: 165 mg/dL — ABNORMAL HIGH (ref <150)
VLDL: 33 mg/dL (ref 0–40)

## 2021-01-29 LAB — HEMOGLOBIN A1C
Hgb A1c MFr Bld: 5.7 % — ABNORMAL HIGH (ref 4.8–5.6)
Mean Plasma Glucose: 117 mg/dL

## 2021-04-20 ENCOUNTER — Other Ambulatory Visit: Payer: Self-pay | Admitting: Physician Assistant

## 2021-04-20 DIAGNOSIS — E785 Hyperlipidemia, unspecified: Secondary | ICD-10-CM

## 2021-04-20 DIAGNOSIS — I10 Essential (primary) hypertension: Secondary | ICD-10-CM

## 2021-04-27 ENCOUNTER — Ambulatory Visit: Payer: Self-pay | Admitting: Physician Assistant

## 2021-05-04 ENCOUNTER — Other Ambulatory Visit (HOSPITAL_COMMUNITY): Payer: Self-pay | Admitting: Physician Assistant

## 2021-05-04 ENCOUNTER — Other Ambulatory Visit: Payer: Self-pay

## 2021-05-04 ENCOUNTER — Ambulatory Visit: Payer: Self-pay | Admitting: Physician Assistant

## 2021-05-04 ENCOUNTER — Encounter: Payer: Self-pay | Admitting: Physician Assistant

## 2021-05-04 ENCOUNTER — Other Ambulatory Visit: Payer: Self-pay | Admitting: Physician Assistant

## 2021-05-04 ENCOUNTER — Other Ambulatory Visit (HOSPITAL_COMMUNITY)
Admission: RE | Admit: 2021-05-04 | Discharge: 2021-05-04 | Disposition: A | Discharge status: Home / Self Care | Payer: Self-pay | Source: Ambulatory Visit | Attending: Physician Assistant | Admitting: Physician Assistant

## 2021-05-04 VITALS — BP 149/82 | HR 55 | Temp 97.4°F | Wt 173.0 lb

## 2021-05-04 DIAGNOSIS — Z1231 Encounter for screening mammogram for malignant neoplasm of breast: Secondary | ICD-10-CM

## 2021-05-04 DIAGNOSIS — E876 Hypokalemia: Secondary | ICD-10-CM

## 2021-05-04 DIAGNOSIS — Z5320 Procedure and treatment not carried out because of patient's decision for unspecified reasons: Secondary | ICD-10-CM

## 2021-05-04 DIAGNOSIS — K0889 Other specified disorders of teeth and supporting structures: Secondary | ICD-10-CM

## 2021-05-04 DIAGNOSIS — I10 Essential (primary) hypertension: Secondary | ICD-10-CM | POA: Insufficient documentation

## 2021-05-04 DIAGNOSIS — Z1239 Encounter for other screening for malignant neoplasm of breast: Secondary | ICD-10-CM

## 2021-05-04 DIAGNOSIS — E785 Hyperlipidemia, unspecified: Secondary | ICD-10-CM | POA: Insufficient documentation

## 2021-05-04 DIAGNOSIS — Z1211 Encounter for screening for malignant neoplasm of colon: Secondary | ICD-10-CM

## 2021-05-04 DIAGNOSIS — E669 Obesity, unspecified: Secondary | ICD-10-CM

## 2021-05-04 DIAGNOSIS — F17200 Nicotine dependence, unspecified, uncomplicated: Secondary | ICD-10-CM

## 2021-05-04 DIAGNOSIS — K029 Dental caries, unspecified: Secondary | ICD-10-CM

## 2021-05-04 LAB — COMPREHENSIVE METABOLIC PANEL
ALT: 21 U/L (ref 0–44)
AST: 16 U/L (ref 15–41)
Albumin: 3.5 g/dL (ref 3.5–5.0)
Alkaline Phosphatase: 68 U/L (ref 38–126)
Anion gap: 4 — ABNORMAL LOW (ref 5–15)
BUN: 15 mg/dL (ref 6–20)
CO2: 28 mmol/L (ref 22–32)
Calcium: 8.6 mg/dL — ABNORMAL LOW (ref 8.9–10.3)
Chloride: 107 mmol/L (ref 98–111)
Creatinine, Ser: 0.74 mg/dL (ref 0.44–1.00)
GFR, Estimated: >60 mL/min (ref >60)
Glucose, Bld: 111 mg/dL — ABNORMAL HIGH (ref 70–99)
Potassium: 3 mmol/L — ABNORMAL LOW (ref 3.5–5.1)
Sodium: 139 mmol/L (ref 135–145)
Total Bilirubin: 0.4 mg/dL (ref 0.3–1.2)
Total Protein: 6.5 g/dL (ref 6.5–8.1)

## 2021-05-04 LAB — LIPID PANEL
Cholesterol: 155 mg/dL (ref 0–200)
HDL: 44 mg/dL (ref >40)
LDL Cholesterol: 88 mg/dL (ref 0–99)
Total CHOL/HDL Ratio: 3.5 RATIO
Triglycerides: 117 mg/dL (ref <150)
VLDL: 23 mg/dL (ref 0–40)

## 2021-05-04 MED ORDER — POTASSIUM CHLORIDE CRYS ER 10 MEQ PO TBCR: 10.0000 meq | EXTENDED_RELEASE_TABLET | Freq: Every day | ORAL | 0 refills | Status: DC

## 2021-05-04 MED ORDER — AMOXICILLIN 500 MG PO CAPS: 500.0000 mg | ORAL_CAPSULE | Freq: Three times a day (TID) | ORAL | 0 refills | Status: AC

## 2021-05-04 MED ORDER — SIMVASTATIN 40 MG PO TABS: 40.0000 mg | ORAL_TABLET | Freq: Every day | ORAL | 3 refills | Status: DC

## 2021-05-04 MED ORDER — METOPROLOL TARTRATE 50 MG PO TABS: 50.0000 mg | ORAL_TABLET | Freq: Two times a day (BID) | ORAL | 3 refills | Status: DC

## 2021-05-04 NOTE — Patient Instructions (Signed)
Blood work September 12 or 13

## 2021-05-04 NOTE — Progress Notes (Signed)
BP (!) 149/82   Pulse (!) 55   Temp (!) 97.4 F (36.3 C)   Wt 173 lb (78.5 kg)   SpO2 98%   BMI 33.79 kg/m    Subjective:    Patient ID: Veronica Arnold, female    DOB: [birth year], 50 y.o.   MRN: [id number]  HPI: Veronica Arnold is a 50 y.o. female presenting on 05/04/2021 for Hypertension and Hyperlipidemia   HPI  Pt had a negative covid 19 screening questionnaire.     Chief Complaint  Patient presents with   Hypertension   Hyperlipidemia      She says she didn't take her medicine yet today but took it yesterday.    She reports dental problems.  She is having problems eating, having to eat soft foods.      She is doing well other than her teeth.       Relevant past medical, surgical, family and social history reviewed and updated as indicated. Interim medical history since our last visit reviewed. Allergies and medications reviewed and updated.    Current Outpatient Medications:    metoprolol tartrate (LOPRESSOR) 50 MG tablet, Take 1 tablet (50 mg total) by mouth 2 (two) times daily., Disp: 60 tablet, Rfl: 3   simvastatin (ZOCOR) 40 MG tablet, Take 1 tablet (40 mg total) by mouth at bedtime., Disp: 30 tablet, Rfl: 3   Review of Systems  Per HPI unless specifically indicated above     Objective:    BP (!) 149/82   Pulse (!) 55   Temp (!) 97.4 F (36.3 C)   Wt 173 lb (78.5 kg)   SpO2 98%   BMI 33.79 kg/m   Wt Readings from Last 3 Encounters:  05/04/21 173 lb (78.5 kg)  01/08/21 174 lb 6.4 oz (79.1 kg)  02/27/20 170 lb 8 oz (77.3 kg)    Physical Exam Vitals reviewed.  Constitutional:      General: She is not in acute distress.    Appearance: She is well-developed. She is not ill-appearing.  HENT:     Head: Normocephalic and atraumatic.     Mouth/Throat:     Mouth: Mucous membranes are moist.     Dentition: Abnormal dentition. Dental tenderness and dental caries present.     Pharynx: Uvula midline.     Comments: No drooling or  trismus Cardiovascular:     Rate and Rhythm: Normal rate and regular rhythm.  Pulmonary:     Effort: Pulmonary effort is normal.     Breath sounds: Normal breath sounds.  Abdominal:     General: Bowel sounds are normal.     Palpations: Abdomen is soft. There is no mass.     Tenderness: There is no abdominal tenderness.  Musculoskeletal:     Cervical back: Neck supple.     Right lower leg: No edema.     Left lower leg: No edema.  Lymphadenopathy:     Cervical: No cervical adenopathy.  Skin:    General: Skin is warm and dry.  Neurological:     Mental Status: She is alert and oriented to person, place, and time.     Motor: No weakness or tremor.     Gait: Gait is intact.  Psychiatric:        Behavior: Behavior normal.    Results for orders placed or performed during the hospital encounter of 05/04/21  Lipid panel  Result Value Ref Range   Cholesterol 155 0 - 200 mg/dL  Triglycerides 117 <150 mg/dL   HDL 44 >32 mg/dL   Total CHOL/HDL Ratio 3.5 RATIO   VLDL 23 0 - 40 mg/dL   LDL Cholesterol 88 0 - 99 mg/dL  Comprehensive metabolic panel  Result Value Ref Range   Sodium 139 135 - 145 mmol/L   Potassium 3.0 (L) 3.5 - 5.1 mmol/L   Chloride 107 98 - 111 mmol/L   CO2 28 22 - 32 mmol/L   Glucose, Bld 111 (H) 70 - 99 mg/dL   BUN 15 6 - 20 mg/dL   Creatinine, Ser 2.02 0.44 - 1.00 mg/dL   Calcium 8.6 (L) 8.9 - 10.3 mg/dL   Total Protein 6.5 6.5 - 8.1 g/dL   Albumin 3.5 3.5 - 5.0 g/dL   AST 16 15 - 41 U/L   ALT 21 0 - 44 U/L   Alkaline Phosphatase 68 38 - 126 U/L   Total Bilirubin 0.4 0.3 - 1.2 mg/dL   GFR, Estimated >54 >27 mL/min   Anion gap 4 (L) 5 - 15      Assessment & Plan:    Encounter Diagnoses  Name Primary?   Essential hypertension Yes   Hyperlipidemia, unspecified hyperlipidemia type    Dentalgia    Dental decay    Tobacco use disorder    Obesity, unspecified classification, unspecified obesity type, unspecified whether serious comorbidity present     Hypokalemia    Encounter for screening for malignant neoplasm of breast, unspecified screening modality    Papanicolaou smear declined        -Rx amoxil for the teeth and she will be put on dental list -reviewed labs with pt -rx K+ for one week -Reckeck K+ labs on september 12 or 13 -will refer for screening Mammogram -pt was given FIT test for colon cancer screening -Pt doesn't want to do pap at next appt; she wants to wait until she gets her teeth and potassium fixed.   -pt to follow up 3 months.   She is to contact office sooner prn

## 2021-05-05 ENCOUNTER — Telehealth: Payer: Self-pay

## 2021-05-05 NOTE — Telephone Encounter (Signed)
Called pt to inform of mammogram, no answer & cell # not working.

## 2021-05-06 ENCOUNTER — Telehealth: Payer: Self-pay

## 2021-05-06 NOTE — Telephone Encounter (Signed)
Spoke with pt & informed of scheduled mammogram, pt understood instructions given.

## 2021-05-14 ENCOUNTER — Ambulatory Visit (HOSPITAL_COMMUNITY): Payer: Self-pay

## 2021-05-20 ENCOUNTER — Other Ambulatory Visit: Payer: Self-pay

## 2021-05-20 ENCOUNTER — Ambulatory Visit (HOSPITAL_COMMUNITY)
Admission: RE | Admit: 2021-05-20 | Discharge: 2021-05-20 | Disposition: A | Discharge status: Home / Self Care | Payer: Self-pay | Source: Ambulatory Visit | Attending: Physician Assistant | Admitting: Physician Assistant

## 2021-05-20 DIAGNOSIS — Z1231 Encounter for screening mammogram for malignant neoplasm of breast: Secondary | ICD-10-CM | POA: Insufficient documentation

## 2021-06-02 LAB — IFOBT (OCCULT BLOOD): IFOBT: NEGATIVE

## 2021-07-23 ENCOUNTER — Other Ambulatory Visit: Payer: Self-pay | Admitting: Physician Assistant

## 2021-07-23 DIAGNOSIS — I10 Essential (primary) hypertension: Secondary | ICD-10-CM

## 2021-07-23 DIAGNOSIS — E785 Hyperlipidemia, unspecified: Secondary | ICD-10-CM

## 2021-07-28 ENCOUNTER — Encounter: Payer: Self-pay | Admitting: Physician Assistant

## 2021-07-28 ENCOUNTER — Ambulatory Visit: Payer: Self-pay | Admitting: Physician Assistant

## 2021-07-28 VITALS — BP 189/101 | HR 69 | Temp 97.0°F | Wt 169.0 lb

## 2021-07-28 DIAGNOSIS — I10 Essential (primary) hypertension: Secondary | ICD-10-CM

## 2021-07-28 DIAGNOSIS — K0889 Other specified disorders of teeth and supporting structures: Secondary | ICD-10-CM

## 2021-07-28 MED ORDER — AMOXICILLIN 500 MG PO CAPS: 500.0000 mg | ORAL_CAPSULE | Freq: Three times a day (TID) | ORAL | 0 refills | Status: AC

## 2021-07-28 MED ORDER — DICLOFENAC SODIUM 75 MG PO TBEC: 75.0000 mg | DELAYED_RELEASE_TABLET | Freq: Two times a day (BID) | ORAL | 0 refills | Status: DC | PRN

## 2021-07-28 NOTE — Progress Notes (Signed)
   BP (!) 189/101   Pulse 69   Temp (!) 97 F (36.1 C)   Wt 169 lb (76.7 kg)   SpO2 97%   BMI 33.01 kg/m    Subjective:    Patient ID: Veronica Arnold, female    DOB: 06-02-1971, 50 y.o.   MRN: [id number]  HPI: Veronica Arnold is a 50 y.o. female presenting on 07/28/2021 for Dental Pain   HPI   Chief Complaint  Patient presents with   Dental Pain     She hasn't taken her bp meds yet today; she says she forgot  Relevant past medical, surgical, family and social history reviewed and updated as indicated. Interim medical history since our last visit reviewed. Allergies and medications reviewed and updated.   Current Outpatient Medications:    metoprolol tartrate (LOPRESSOR) 50 MG tablet, Take 1 tablet (50 mg total) by mouth 2 (two) times daily., Disp: 60 tablet, Rfl: 3   potassium chloride (KLOR-CON) 10 MEQ tablet, Take 1 tablet (10 mEq total) by mouth daily., Disp: 7 tablet, Rfl: 0   simvastatin (ZOCOR) 40 MG tablet, Take 1 tablet (40 mg total) by mouth at bedtime., Disp: 30 tablet, Rfl: 3    Review of Systems  Per HPI unless specifically indicated above     Objective:    BP (!) 189/101   Pulse 69   Temp (!) 97 F (36.1 C)   Wt 169 lb (76.7 kg)   SpO2 97%   BMI 33.01 kg/m   Wt Readings from Last 3 Encounters:  07/28/21 169 lb (76.7 kg)  05/04/21 173 lb (78.5 kg)  01/08/21 174 lb 6.4 oz (79.1 kg)    Physical Exam Constitutional:      General: She is not in acute distress.    Appearance: She is not toxic-appearing.  HENT:     Head: Normocephalic and atraumatic.     Mouth/Throat:     Dentition: Abnormal dentition. Dental caries present. No dental abscesses.     Pharynx: No pharyngeal swelling or oropharyngeal exudate.     Comments: Dental decay.  Some teeth missing.  No abscess.  No swelling of face.  No drooling.  No trismus Pulmonary:     Effort: Pulmonary effort is normal. No respiratory distress.  Neurological:     Mental Status: She is alert and  oriented to person, place, and time.  Psychiatric:        Attention and Perception: Attention normal.        Speech: Speech normal.        Behavior: Behavior is cooperative.          Assessment & Plan:   Encounter Diagnoses  Name Primary?   Dentalgia Yes   Essential hypertension       -Rx amoxil and diclofenac for teeth.  She says she is going to walk-in dental clinic next week -pt is urged to go take her bp meds now; she says she is headed there now -pt has routine follow up next week

## 2021-08-03 ENCOUNTER — Other Ambulatory Visit (HOSPITAL_COMMUNITY)
Admission: RE | Admit: 2021-08-03 | Discharge: 2021-08-03 | Disposition: A | Discharge status: Home / Self Care | Payer: Self-pay | Source: Ambulatory Visit | Attending: Physician Assistant | Admitting: Physician Assistant

## 2021-08-03 DIAGNOSIS — E876 Hypokalemia: Secondary | ICD-10-CM

## 2021-08-03 DIAGNOSIS — E785 Hyperlipidemia, unspecified: Secondary | ICD-10-CM | POA: Insufficient documentation

## 2021-08-03 DIAGNOSIS — I10 Essential (primary) hypertension: Secondary | ICD-10-CM | POA: Insufficient documentation

## 2021-08-03 LAB — COMPREHENSIVE METABOLIC PANEL
ALT: 20 U/L (ref 0–44)
AST: 21 U/L (ref 15–41)
Albumin: 3.5 g/dL (ref 3.5–5.0)
Alkaline Phosphatase: 78 U/L (ref 38–126)
Anion gap: 6 (ref 5–15)
BUN: 11 mg/dL (ref 6–20)
CO2: 28 mmol/L (ref 22–32)
Calcium: 8.7 mg/dL — ABNORMAL LOW (ref 8.9–10.3)
Chloride: 106 mmol/L (ref 98–111)
Creatinine, Ser: 0.66 mg/dL (ref 0.44–1.00)
GFR, Estimated: >60 mL/min (ref >60)
Glucose, Bld: 107 mg/dL — ABNORMAL HIGH (ref 70–99)
Potassium: 3.3 mmol/L — ABNORMAL LOW (ref 3.5–5.1)
Sodium: 140 mmol/L (ref 135–145)
Total Bilirubin: 0.4 mg/dL (ref 0.3–1.2)
Total Protein: 6.6 g/dL (ref 6.5–8.1)

## 2021-08-03 LAB — LIPID PANEL
Cholesterol: 177 mg/dL (ref 0–200)
HDL: 51 mg/dL (ref >40)
LDL Cholesterol: 102 mg/dL — ABNORMAL HIGH (ref 0–99)
Total CHOL/HDL Ratio: 3.5 RATIO
Triglycerides: 120 mg/dL (ref <150)
VLDL: 24 mg/dL (ref 0–40)

## 2021-08-05 ENCOUNTER — Ambulatory Visit: Payer: Self-pay | Admitting: Physician Assistant

## 2021-08-05 ENCOUNTER — Other Ambulatory Visit: Payer: Self-pay

## 2021-08-05 ENCOUNTER — Encounter: Payer: Self-pay | Admitting: Physician Assistant

## 2021-08-05 VITALS — BP 189/98 | HR 57 | Temp 98.3°F | Wt 172.0 lb

## 2021-08-05 DIAGNOSIS — E785 Hyperlipidemia, unspecified: Secondary | ICD-10-CM

## 2021-08-05 DIAGNOSIS — I10 Essential (primary) hypertension: Secondary | ICD-10-CM

## 2021-08-05 DIAGNOSIS — E876 Hypokalemia: Secondary | ICD-10-CM

## 2021-08-05 DIAGNOSIS — F17200 Nicotine dependence, unspecified, uncomplicated: Secondary | ICD-10-CM

## 2021-08-05 MED ORDER — POTASSIUM CHLORIDE CRYS ER 10 MEQ PO TBCR: 10.0000 meq | EXTENDED_RELEASE_TABLET | Freq: Every day | ORAL | 0 refills | Status: DC

## 2021-08-05 MED ORDER — AMLODIPINE BESYLATE 5 MG PO TABS: 5.0000 mg | ORAL_TABLET | Freq: Every day | ORAL | 1 refills | Status: DC

## 2021-08-05 NOTE — Progress Notes (Signed)
BP (!) 189/98   Pulse (!) 57   Temp 98.3 F (36.8 C)   Wt 172 lb (78 kg)   SpO2 99%   BMI 33.59 kg/m    Subjective:    Patient ID: Veronica Arnold, female    DOB: 04-30-1971, 50 y.o.   MRN: [id number]  HPI: Veronica Arnold is a 50 y.o. female presenting on 08/05/2021 for Hyperlipidemia and Hypertension   HPI   Chief Complaint  Patient presents with   Hyperlipidemia   Hypertension    Pt says she is doing well and has no complaints today.    She is not having HA, vision changes or CP.    Relevant past medical, surgical, family and social history reviewed and updated as indicated. Interim medical history since our last visit reviewed. Allergies and medications reviewed and updated.   Current Outpatient Medications:    amoxicillin (AMOXIL) 500 MG capsule, Take 1 capsule (500 mg total) by mouth 3 (three) times daily for 10 days., Disp: 30 capsule, Rfl: 0   diclofenac (VOLTAREN) 75 MG EC tablet, Take 1 tablet (75 mg total) by mouth 2 (two) times daily as needed., Disp: 30 tablet, Rfl: 0   metoprolol tartrate (LOPRESSOR) 50 MG tablet, Take 1 tablet (50 mg total) by mouth 2 (two) times daily., Disp: 60 tablet, Rfl: 3   potassium chloride (KLOR-CON) 10 MEQ tablet, Take 1 tablet (10 mEq total) by mouth daily., Disp: 7 tablet, Rfl: 0   simvastatin (ZOCOR) 40 MG tablet, Take 1 tablet (40 mg total) by mouth at bedtime., Disp: 30 tablet, Rfl: 3   Review of Systems  Per HPI unless specifically indicated above     Objective:    BP (!) 189/98   Pulse (!) 57   Temp 98.3 F (36.8 C)   Wt 172 lb (78 kg)   SpO2 99%   BMI 33.59 kg/m   Wt Readings from Last 3 Encounters:  08/05/21 172 lb (78 kg)  07/28/21 169 lb (76.7 kg)  05/04/21 173 lb (78.5 kg)    Physical Exam Vitals reviewed.  Constitutional:      Appearance: She is well-developed.  HENT:     Head: Normocephalic and atraumatic.  Cardiovascular:     Rate and Rhythm: Normal rate and regular rhythm.  Pulmonary:      Effort: Pulmonary effort is normal.     Breath sounds: Normal breath sounds.  Abdominal:     General: Bowel sounds are normal.     Palpations: Abdomen is soft. There is no mass.     Tenderness: There is no abdominal tenderness.  Musculoskeletal:     Cervical back: Neck supple.     Right lower leg: No edema.     Left lower leg: No edema.  Lymphadenopathy:     Cervical: No cervical adenopathy.  Skin:    General: Skin is warm and dry.  Neurological:     Mental Status: She is alert and oriented to person, place, and time.  Psychiatric:        Behavior: Behavior normal.    Results for orders placed or performed during the hospital encounter of 08/03/21  Lipid panel  Result Value Ref Range   Cholesterol 177 0 - 200 mg/dL   Triglycerides 938 <101 mg/dL   HDL 51 >75 mg/dL   Total CHOL/HDL Ratio 3.5 RATIO   VLDL 24 0 - 40 mg/dL   LDL Cholesterol 102 (H) 0 - 99 mg/dL  Comprehensive metabolic panel  Result Value Ref Range   Sodium 140 135 - 145 mmol/L   Potassium 3.3 (L) 3.5 - 5.1 mmol/L   Chloride 106 98 - 111 mmol/L   CO2 28 22 - 32 mmol/L   Glucose, Bld 107 (H) 70 - 99 mg/dL   BUN 11 6 - 20 mg/dL   Creatinine, Ser 4.65 0.44 - 1.00 mg/dL   Calcium 8.7 (L) 8.9 - 10.3 mg/dL   Total Protein 6.6 6.5 - 8.1 g/dL   Albumin 3.5 3.5 - 5.0 g/dL   AST 21 15 - 41 U/L   ALT 20 0 - 44 U/L   Alkaline Phosphatase 78 38 - 126 U/L   Total Bilirubin 0.4 0.3 - 1.2 mg/dL   GFR, Estimated >03 >54 mL/min   Anion gap 6 5 - 15      Assessment & Plan:   Encounter Diagnoses  Name Primary?   Essential hypertension Yes   Hyperlipidemia, unspecified hyperlipidemia type    Tobacco use disorder    Hypokalemia      -Reviewed labs with pt -Add amlodipine.  Continue metoprolol -K+ for one week -Refer to BP monitoring program -Will plan to recheck bmp about a month -Pt to follow up for bp recheck 2 wk -Will plan to update pap early next year -pt to contact office if needed before her f/u in 2  weeks

## 2021-08-19 ENCOUNTER — Ambulatory Visit: Payer: Self-pay | Admitting: Physician Assistant

## 2021-09-21 NOTE — Congregational Nurse Program (Signed)
Attempted to f/u with Pt by phone on today but pt was unavailable.  Left voicemail and text message.

## 2021-09-30 ENCOUNTER — Other Ambulatory Visit: Payer: Self-pay | Admitting: Physician Assistant

## 2021-11-02 IMAGING — MG MM DIGITAL SCREENING BILAT W/ TOMO AND CAD
6 of 10 series · 6 of 30 positions shown · non-contrast
Comparison: Previous exam(s).

CLINICAL DATA: Screening.

EXAM:
DIGITAL SCREENING BILATERAL MAMMOGRAM WITH TOMOSYNTHESIS AND CAD
TECHNIQUE: Bilateral screening digital craniocaudal and mediolateral oblique
mammograms were obtained. Bilateral screening digital breast
tomosynthesis was performed. The images were evaluated with
computer-aided detection.

[R CC synth-2D]
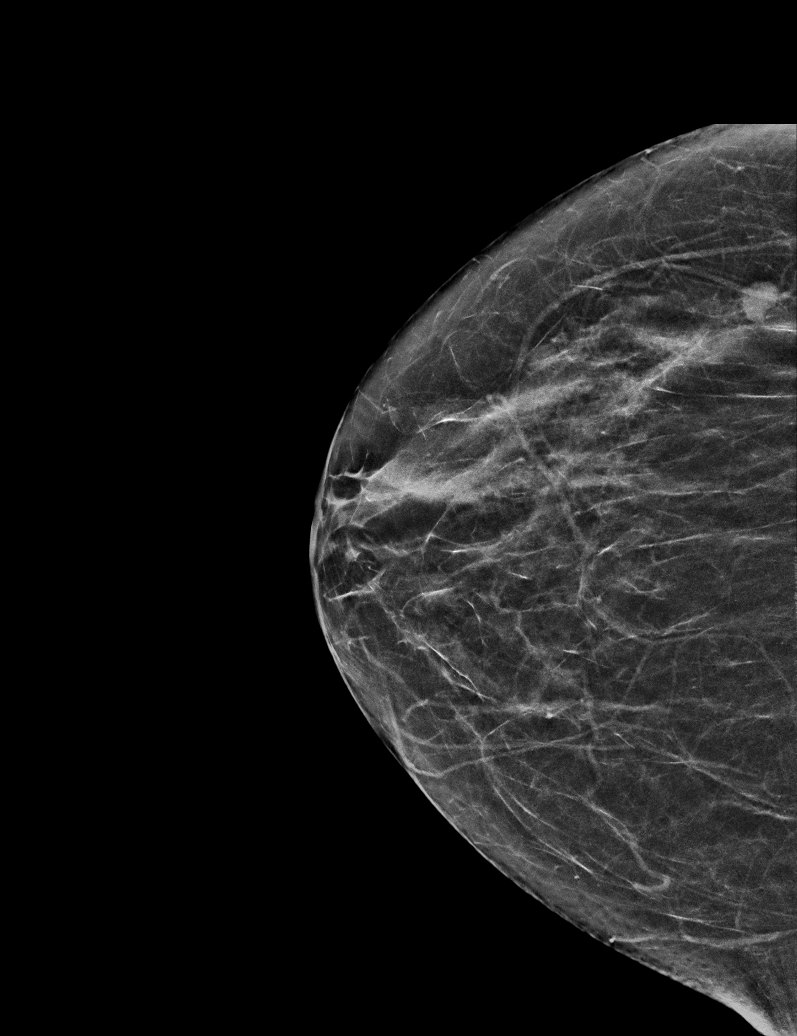

[L CC synth-2D]
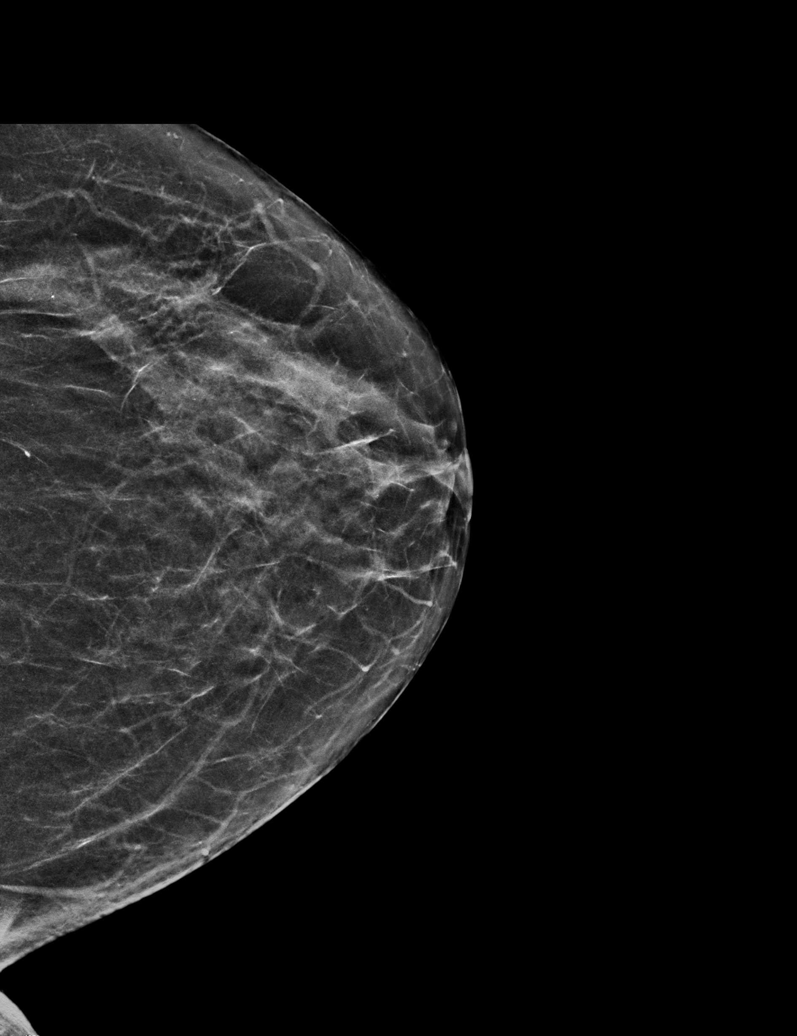

[R MLO synth-2D (1 of 2)]
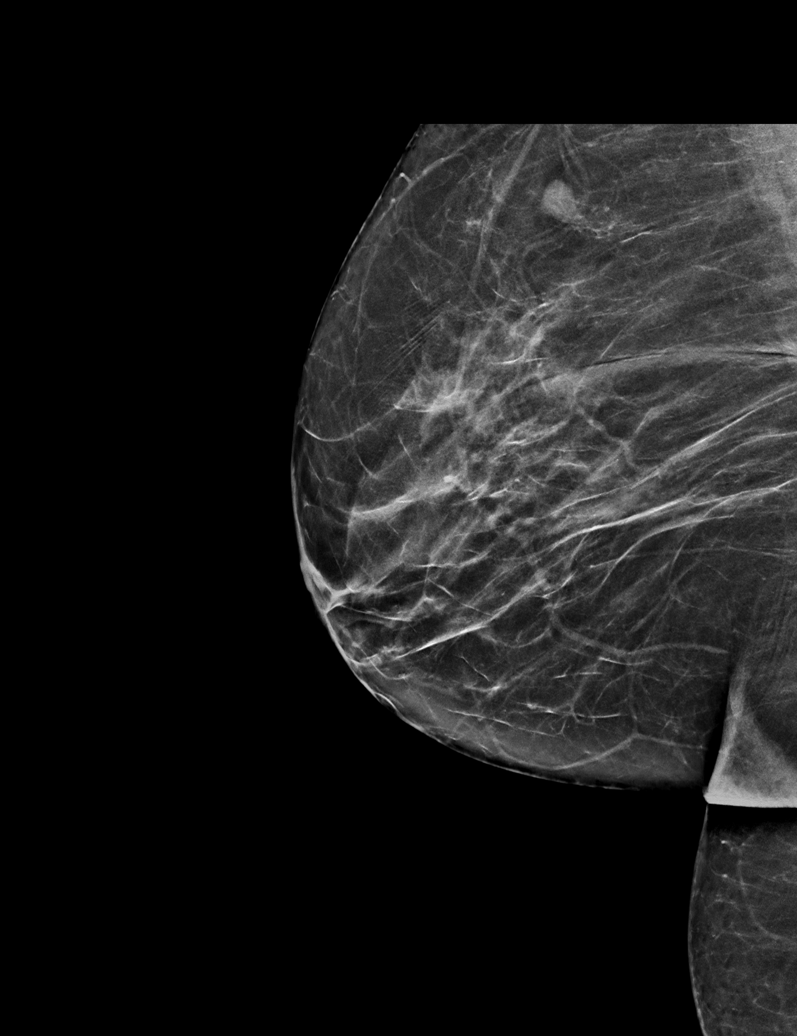

[R MLO synth-2D (2 of 2)]
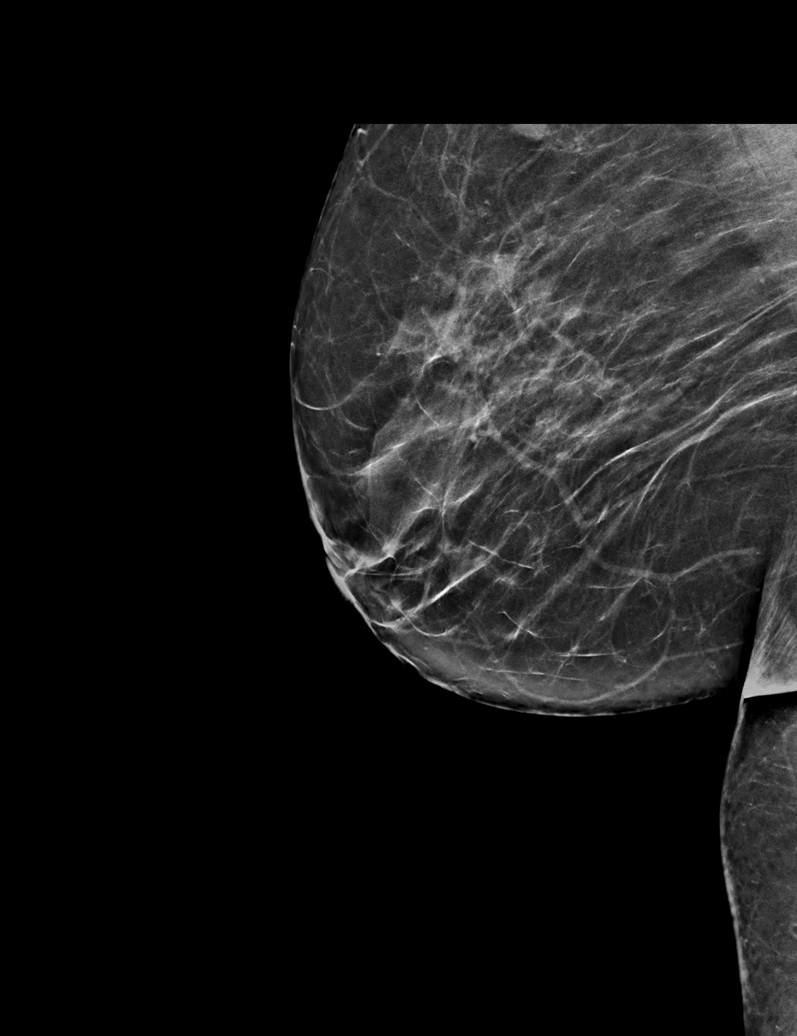

[L MLO synth-2D]
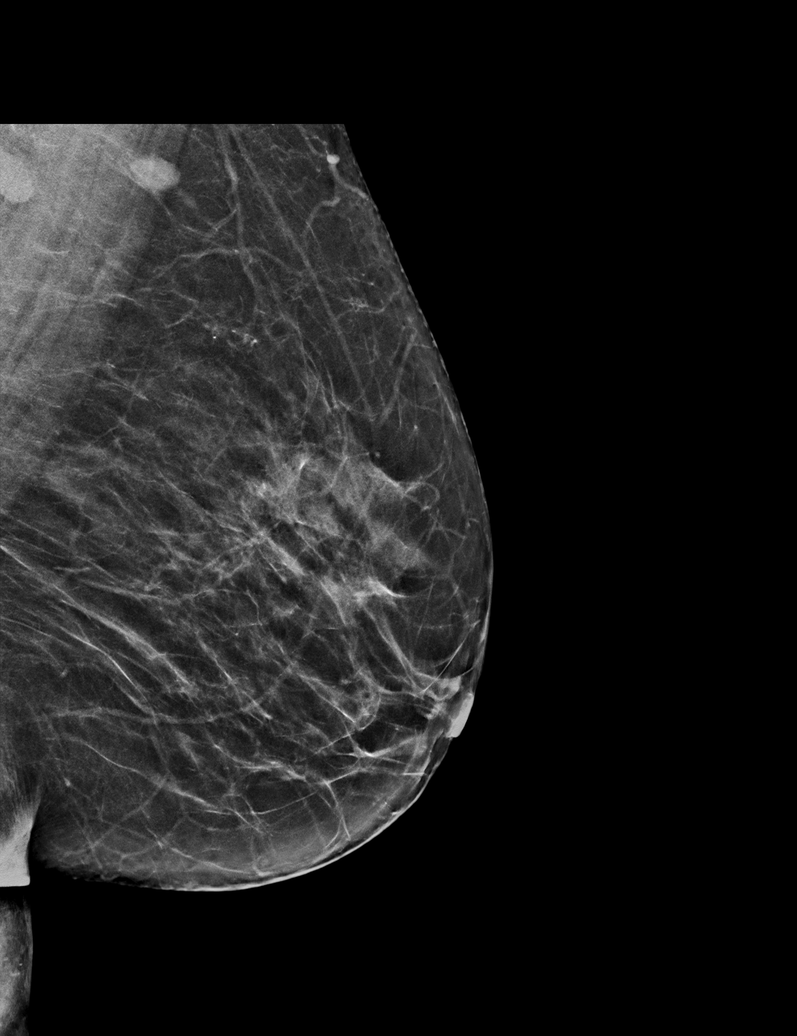

[R MLO tomo · tomo slice 29/58.0]
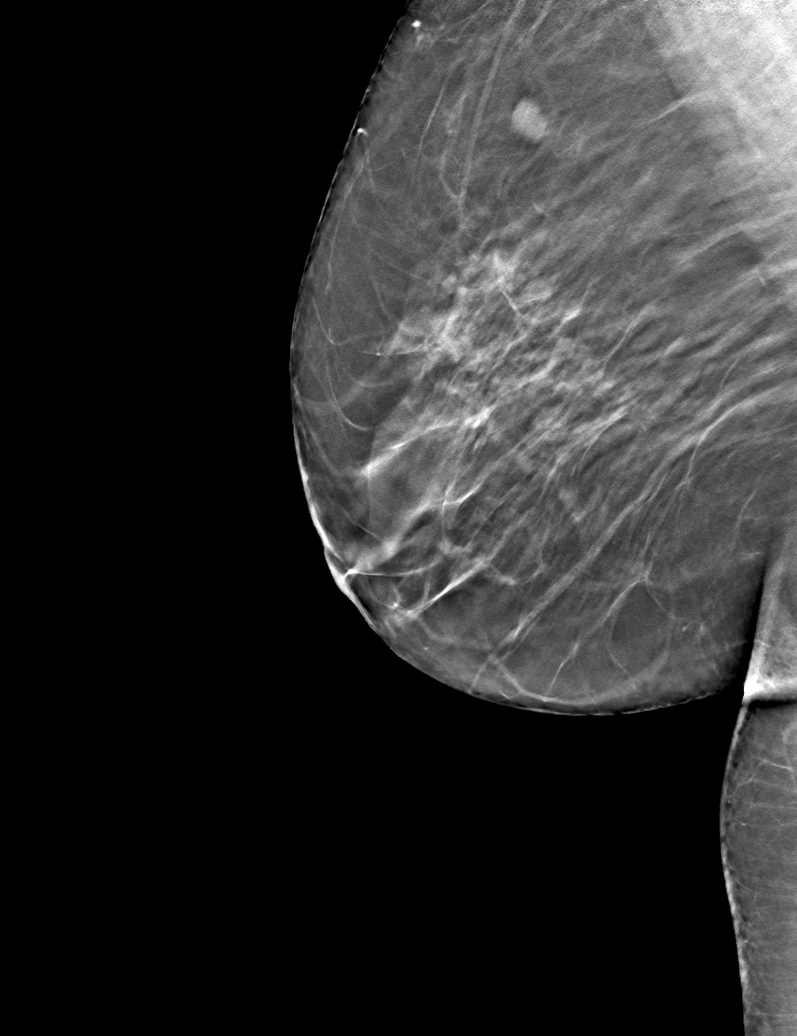

[6 of 30 positions shown; findings below may reference images not displayed]

ACR Breast Density Category c: The breast tissue is heterogeneously
dense, which may obscure small masses.
FINDINGS: There are no findings suspicious for malignancy.
IMPRESSION: No mammographic evidence of malignancy. A result letter of this
screening mammogram will be mailed directly to the patient.

RECOMMENDATION:
Screening mammogram in one year. (Code:Q3-W-BC3)

BI-RADS CATEGORY  1: Negative.

## 2021-11-05 ENCOUNTER — Other Ambulatory Visit: Payer: Self-pay | Admitting: Physician Assistant

## 2021-12-26 ENCOUNTER — Other Ambulatory Visit: Payer: Self-pay | Admitting: Physician Assistant

## 2021-12-28 ENCOUNTER — Other Ambulatory Visit: Payer: Self-pay | Admitting: Physician Assistant

## 2021-12-28 DIAGNOSIS — I10 Essential (primary) hypertension: Secondary | ICD-10-CM

## 2021-12-28 DIAGNOSIS — E785 Hyperlipidemia, unspecified: Secondary | ICD-10-CM

## 2022-01-05 ENCOUNTER — Ambulatory Visit: Payer: Self-pay | Admitting: Physician Assistant

## 2022-01-05 DIAGNOSIS — J069 Acute upper respiratory infection, unspecified: Secondary | ICD-10-CM

## 2022-01-05 NOTE — Progress Notes (Signed)
? ?  There were no vitals taken for this visit.  ? ?Subjective:  ? ? Patient ID: Veronica Arnold, female    DOB: [birth year], 51 y.o.   MRN: [id number] ? ?HPI: ?Veronica Arnold is a 51 y.o. female presenting on 01/05/2022 for No chief complaint on file. ? ? ?HPI ? ?This is a telemedicine appointment through the Telephone as pt was unable to connect through [employer]. ? ?I connected with  Krysten A Huntsberry on 01/05/22 by a video enabled telemedicine application and verified that I am speaking with the correct person using two identifiers. ?  ?I discussed the limitations of evaluation and management by telemedicine. The patient expressed understanding and agreed to proceed. ? ?Pt is at home.   Provider is at office.   ? ? ?Pt is 50yoF who was shceduled for in-person appointment to follow up HTN and dyslipidemia.  She called to notify that she is sick and her appointment was changed to virtual.   ? ?She has been Sick since yesterday.  She is having Cough,  no ST, some running nose, no fever.  No sob.  Some wheezing ? ?She is using some OTC cough medicine which is helping.   ? ?No one else at home is sick. ? ?She did not do a covid test.  She doesn't feel really really bad just knows she wasn't supposed to come in to the office sick.   ? ? ? ?Relevant past medical, surgical, family and social history reviewed and updated as indicated. Interim medical history since our last visit reviewed. ?Allergies and medications reviewed and updated. ? ? ? ?Review of Systems ? ?Per HPI unless specifically indicated above ? ?   ?Objective:  ?  ?There were no vitals taken for this visit.  ?Wt Readings from Last 3 Encounters:  ?08/05/21 172 lb (78 kg)  ?07/28/21 169 lb (76.7 kg)  ?05/04/21 173 lb (78.5 kg)  ?  ?Physical Exam ?Pulmonary:  ?   Effort: No respiratory distress.  ?   Comments: Pt is talking in complete sentences without dyspnea ?Neurological:  ?   Mental Status: She is alert and oriented to person, place, and time.  ?Psychiatric:     ?    Attention and Perception: Attention normal.     ?   Mood and Affect: Mood normal.     ?   Speech: Speech normal.     ?   Behavior: Behavior is cooperative.  ? ? ? ? ? ?   ?Assessment & Plan:  ? ? ?Encounter Diagnosis  ?Name Primary?  ? Upper respiratory tract infection, unspecified type Yes  ? ? ? ?-pt to rest, fluids, OTCs prn ?-recommended covid test ?-pt is rescheduled to come into the office for follow up HTN and dyslipidemia later this month.  She is to contact office sooner prn ? ? ? ?

## 2022-01-11 ENCOUNTER — Telehealth: Payer: Self-pay | Admitting: Physician Assistant

## 2022-01-11 NOTE — Telephone Encounter (Signed)
Pt was called and reminded that she needed to update her enrollment through [facility] as it expired in 2021.  She was given contact information and states understanding ?

## 2022-01-22 ENCOUNTER — Other Ambulatory Visit (HOSPITAL_COMMUNITY)
Admission: RE | Admit: 2022-01-22 | Discharge: 2022-01-22 | Disposition: A | Discharge status: Home / Self Care | Payer: Self-pay | Source: Ambulatory Visit | Attending: Physician Assistant | Admitting: Physician Assistant

## 2022-01-22 DIAGNOSIS — I10 Essential (primary) hypertension: Secondary | ICD-10-CM | POA: Insufficient documentation

## 2022-01-22 DIAGNOSIS — E785 Hyperlipidemia, unspecified: Secondary | ICD-10-CM | POA: Insufficient documentation

## 2022-01-22 LAB — COMPREHENSIVE METABOLIC PANEL
ALT: 14 U/L (ref 0–44)
AST: 16 U/L (ref 15–41)
Albumin: 3.6 g/dL (ref 3.5–5.0)
Alkaline Phosphatase: 80 U/L (ref 38–126)
Anion gap: 6 (ref 5–15)
BUN: 13 mg/dL (ref 6–20)
CO2: 26 mmol/L (ref 22–32)
Calcium: 8.7 mg/dL — ABNORMAL LOW (ref 8.9–10.3)
Chloride: 106 mmol/L (ref 98–111)
Creatinine, Ser: 0.7 mg/dL (ref 0.44–1.00)
GFR, Estimated: >60 mL/min (ref >60)
Glucose, Bld: 141 mg/dL — ABNORMAL HIGH (ref 70–99)
Potassium: 3.4 mmol/L — ABNORMAL LOW (ref 3.5–5.1)
Sodium: 138 mmol/L (ref 135–145)
Total Bilirubin: 0.4 mg/dL (ref 0.3–1.2)
Total Protein: 7 g/dL (ref 6.5–8.1)

## 2022-01-22 LAB — LIPID PANEL
Cholesterol: 187 mg/dL (ref 0–200)
HDL: 52 mg/dL (ref >40)
LDL Cholesterol: 111 mg/dL — ABNORMAL HIGH (ref 0–99)
Total CHOL/HDL Ratio: 3.6 RATIO
Triglycerides: 120 mg/dL (ref <150)
VLDL: 24 mg/dL (ref 0–40)

## 2022-01-26 ENCOUNTER — Ambulatory Visit: Payer: Self-pay | Admitting: Physician Assistant

## 2022-01-26 ENCOUNTER — Encounter: Payer: Self-pay | Admitting: Physician Assistant

## 2022-01-26 VITALS — BP 140/90 | HR 61 | Temp 97.2°F | Wt 184.0 lb

## 2022-01-26 DIAGNOSIS — E785 Hyperlipidemia, unspecified: Secondary | ICD-10-CM

## 2022-01-26 DIAGNOSIS — Z5320 Procedure and treatment not carried out because of patient's decision for unspecified reasons: Secondary | ICD-10-CM

## 2022-01-26 DIAGNOSIS — I10 Essential (primary) hypertension: Secondary | ICD-10-CM

## 2022-01-26 DIAGNOSIS — F17200 Nicotine dependence, unspecified, uncomplicated: Secondary | ICD-10-CM

## 2022-01-26 MED ORDER — METOPROLOL TARTRATE 50 MG PO TABS: 50.0000 mg | ORAL_TABLET | Freq: Two times a day (BID) | ORAL | 0 refills | Status: DC

## 2022-01-26 MED ORDER — AMLODIPINE BESYLATE 10 MG PO TABS: 10.0000 mg | ORAL_TABLET | Freq: Every day | ORAL | 0 refills | Status: DC

## 2022-01-26 MED ORDER — ATORVASTATIN CALCIUM 20 MG PO TABS: 20.0000 mg | ORAL_TABLET | Freq: Every day | ORAL | 0 refills | Status: DC

## 2022-01-26 NOTE — Progress Notes (Signed)
BP 140/90   Pulse 61   Temp (!) 97.2 F (36.2 C)   Wt 184 lb (83.5 kg)   SpO2 97%   BMI 35.94 kg/m    Subjective:    Patient ID: Veronica Arnold, female    DOB: 05-16-1971, 51 y.o.   MRN: [id number]  HPI: Veronica Arnold is a 51 y.o. female presenting on 01/26/2022 for Hypertension (/) and Hyperlipidemia   HPI   Chief Complaint  Patient presents with   Hypertension        Hyperlipidemia    Pt is 50yoF who ws las in office in November.  She says she is doing well.  She says she has her papers to take to [facility] when she leaves here today.  She has no complaints.     Relevant past medical, surgical, family and social history reviewed and updated as indicated. Interim medical history since our last visit reviewed. Allergies and medications reviewed and updated.   Current Outpatient Medications:    amLODipine (NORVASC) 5 MG tablet, Take 1 tablet by mouth once daily, Disp: 7 tablet, Rfl: 0   metoprolol tartrate (LOPRESSOR) 50 MG tablet, Take 1 tablet (50 mg total) by mouth 2 (two) times daily., Disp: 60 tablet, Rfl: 3   simvastatin (ZOCOR) 40 MG tablet, TAKE 1 TABLET BY MOUTH AT BEDTIME, Disp: 30 tablet, Rfl: 0   diclofenac (VOLTAREN) 75 MG EC tablet, Take 1 tablet (75 mg total) by mouth 2 (two) times daily as needed. (Patient not taking: Reported on 01/26/2022), Disp: 30 tablet, Rfl: 0   potassium chloride (KLOR-CON M) 10 MEQ tablet, Take 1 tablet (10 mEq total) by mouth daily. (Patient not taking: Reported on 01/26/2022), Disp: 7 tablet, Rfl: 0   Review of Systems  Per HPI unless specifically indicated above     Objective:    BP 140/90   Pulse 61   Temp (!) 97.2 F (36.2 C)   Wt 184 lb (83.5 kg)   SpO2 97%   BMI 35.94 kg/m   Wt Readings from Last 3 Encounters:  01/26/22 184 lb (83.5 kg)  08/05/21 172 lb (78 kg)  07/28/21 169 lb (76.7 kg)    Physical Exam Vitals reviewed.  Constitutional:      General: She is not in acute distress.    Appearance: She  is well-developed. She is not ill-appearing.  HENT:     Head: Normocephalic and atraumatic.  Cardiovascular:     Rate and Rhythm: Normal rate and regular rhythm.  Pulmonary:     Effort: Pulmonary effort is normal.     Breath sounds: Normal breath sounds.  Abdominal:     General: Bowel sounds are normal.     Palpations: Abdomen is soft. There is no mass.     Tenderness: There is no abdominal tenderness.  Musculoskeletal:     Cervical back: Neck supple.     Right lower leg: No edema.     Left lower leg: No edema.  Lymphadenopathy:     Cervical: No cervical adenopathy.  Skin:    General: Skin is warm and dry.  Neurological:     Mental Status: She is alert and oriented to person, place, and time.  Psychiatric:        Behavior: Behavior normal.    Results for orders placed or performed during the hospital encounter of 01/22/22  Lipid panel  Result Value Ref Range   Cholesterol 187 0 - 200 mg/dL   Triglycerides 621 <308  mg/dL   HDL 52 >08 mg/dL   Total CHOL/HDL Ratio 3.6 RATIO   VLDL 24 0 - 40 mg/dL   LDL Cholesterol 657 (H) 0 - 99 mg/dL  Comprehensive metabolic panel  Result Value Ref Range   Sodium 138 135 - 145 mmol/L   Potassium 3.4 (L) 3.5 - 5.1 mmol/L   Chloride 106 98 - 111 mmol/L   CO2 26 22 - 32 mmol/L   Glucose, Bld 141 (H) 70 - 99 mg/dL   BUN 13 6 - 20 mg/dL   Creatinine, Ser 8.46 0.44 - 1.00 mg/dL   Calcium 8.7 (L) 8.9 - 10.3 mg/dL   Total Protein 7.0 6.5 - 8.1 g/dL   Albumin 3.6 3.5 - 5.0 g/dL   AST 16 15 - 41 U/L   ALT 14 0 - 44 U/L   Alkaline Phosphatase 80 38 - 126 U/L   Total Bilirubin 0.4 0.3 - 1.2 mg/dL   GFR, Estimated >96 >29 mL/min   Anion gap 6 5 - 15      Assessment & Plan:   Encounter Diagnoses  Name Primary?   Essential hypertension Yes   Hyperlipidemia, unspecified hyperlipidemia type    Tobacco use disorder    Pap smear of cervix declined      -reviewed labs with pt -pt Declines pap -screening Mammogram and colon cancer  screening UTD -Increase amlodipine and continue metoprolol -She is going to [facility] today and will get [facility] -will change simvastatin to atorvastatin as it is available through [facility] -pt to F/u 6 wk to recheck bp.  She is to contact office sooner prn

## 2022-02-05 ENCOUNTER — Other Ambulatory Visit: Payer: Self-pay | Admitting: Physician Assistant

## 2022-02-15 ENCOUNTER — Telehealth: Payer: Self-pay | Admitting: Physician Assistant

## 2022-02-15 NOTE — Telephone Encounter (Signed)
Pt called reporting insect bite that itches.  She denies swelling or signs infection.  She is encouraged to use OTC cortisone on the bite to help with itching.  She is encouraged to call office for any worsening or other symptoms

## 2022-03-04 ENCOUNTER — Other Ambulatory Visit: Payer: Self-pay | Admitting: Physician Assistant

## 2022-03-16 ENCOUNTER — Ambulatory Visit: Payer: Self-pay | Admitting: Physician Assistant

## 2022-06-01 ENCOUNTER — Other Ambulatory Visit: Payer: Self-pay | Admitting: Physician Assistant

## 2022-07-08 ENCOUNTER — Other Ambulatory Visit: Payer: Self-pay | Admitting: Physician Assistant

## 2022-07-08 MED ORDER — METOPROLOL TARTRATE 50 MG PO TABS: 50.0000 mg | ORAL_TABLET | Freq: Two times a day (BID) | ORAL | 0 refills | Status: DC

## 2022-07-08 MED ORDER — AMLODIPINE BESYLATE 10 MG PO TABS: 10.0000 mg | ORAL_TABLET | Freq: Every day | ORAL | 0 refills | Status: DC

## 2022-07-19 ENCOUNTER — Encounter: Payer: Self-pay | Admitting: Internal Medicine

## 2022-07-19 ENCOUNTER — Ambulatory Visit (INDEPENDENT_AMBULATORY_CARE_PROVIDER_SITE_OTHER): Payer: 59 | Admitting: Internal Medicine

## 2022-07-19 VITALS — BP 142/86 | HR 74 | Ht 59.0 in | Wt 185.6 lb

## 2022-07-19 DIAGNOSIS — Z0001 Encounter for general adult medical examination with abnormal findings: Secondary | ICD-10-CM

## 2022-07-19 DIAGNOSIS — I10 Essential (primary) hypertension: Secondary | ICD-10-CM | POA: Diagnosis not present

## 2022-07-19 DIAGNOSIS — Z1159 Encounter for screening for other viral diseases: Secondary | ICD-10-CM | POA: Diagnosis not present

## 2022-07-19 DIAGNOSIS — R69 Illness, unspecified: Secondary | ICD-10-CM | POA: Diagnosis not present

## 2022-07-19 DIAGNOSIS — Z23 Encounter for immunization: Secondary | ICD-10-CM | POA: Diagnosis not present

## 2022-07-19 DIAGNOSIS — Z114 Encounter for screening for human immunodeficiency virus [HIV]: Secondary | ICD-10-CM

## 2022-07-19 DIAGNOSIS — E785 Hyperlipidemia, unspecified: Secondary | ICD-10-CM | POA: Diagnosis not present

## 2022-07-19 DIAGNOSIS — F17210 Nicotine dependence, cigarettes, uncomplicated: Secondary | ICD-10-CM

## 2022-07-19 MED ORDER — METOPROLOL TARTRATE 50 MG PO TABS: 50.0000 mg | ORAL_TABLET | Freq: Two times a day (BID) | ORAL | 0 refills | Status: DC

## 2022-07-19 MED ORDER — AMLODIPINE BESYLATE 10 MG PO TABS: 10.0000 mg | ORAL_TABLET | Freq: Every day | ORAL | 0 refills | Status: DC

## 2022-07-19 MED ORDER — ATORVASTATIN CALCIUM 20 MG PO TABS: 20.0000 mg | ORAL_TABLET | Freq: Every day | ORAL | 0 refills | Status: DC

## 2022-07-19 NOTE — Assessment & Plan Note (Signed)
Current smoker.  She states that she smokes less than 0.5 packs/day and has been smoking since age 51.  She knows that she needs to quit and is trying to do so without the assistance of medication. -I congratulated her on taking the initial steps toward cessation and offered my assistance in any way that I can help.

## 2022-07-19 NOTE — Assessment & Plan Note (Signed)
Currently prescribed Lipitor 20 mg daily.  This was a recent medication change as she was previously prescribed simvastatin 40 mg daily.  She has not started taking atorvastatin yet. -Lipid panel ordered today -Agree with atorvastatin

## 2022-07-19 NOTE — Patient Instructions (Signed)
It was a pleasure to see you today.  Thank you for giving [place] the opportunity to be involved in your care.  Below is a brief recap of your visit and next steps.  We will plan to see you again in 4 weeks.  Summary You have established care today. I have refilled your medications and you received your flu shot We will check labs and plan for follow up in 4 weeks I recommend you try some of the sleep hygiene measures as we discussed.

## 2022-07-19 NOTE — Progress Notes (Signed)
New Patient Office Visit  Subjective    Patient ID: Veronica Arnold, female    DOB: 1971-03-12  Age: 51 y.o. MRN: [id number]  CC:  Chief Complaint  Patient presents with   Establish Care   HPI [name] presents to establish care.  She is a 51 year old woman with a past medical history significant for hypertension, hyperlipidemia, and current tobacco use.  She was previously followed at the free clinic of [facility].  Ms. Mcgraw states that she feels well today.  Her acute concern is insomnia, noting that she has had difficulty sleeping for several months.  She otherwise states that she feels well and has no acute concerns to discuss.  Chronic medical conditions and outstanding preventative care items discussed today are individually addressed in A/P below.  Outpatient Encounter Medications as of 07/19/2022  Medication Sig   [DISCONTINUED] amLODipine (NORVASC) 10 MG tablet Take 1 tablet (10 mg total) by mouth daily.   [DISCONTINUED] atorvastatin (LIPITOR) 20 MG tablet Take 1 tablet (20 mg total) by mouth daily.   [DISCONTINUED] metoprolol tartrate (LOPRESSOR) 50 MG tablet Take 1 tablet (50 mg total) by mouth 2 (two) times daily.   [DISCONTINUED] simvastatin (ZOCOR) 40 MG tablet TAKE 1 TABLET BY MOUTH AT BEDTIME   amLODipine (NORVASC) 10 MG tablet Take 1 tablet (10 mg total) by mouth daily.   atorvastatin (LIPITOR) 20 MG tablet Take 1 tablet (20 mg total) by mouth daily.   metoprolol tartrate (LOPRESSOR) 50 MG tablet Take 1 tablet (50 mg total) by mouth 2 (two) times daily.   [DISCONTINUED] amLODipine (NORVASC) 10 MG tablet Take 1 tablet (10 mg total) by mouth daily.   [DISCONTINUED] metoprolol tartrate (LOPRESSOR) 50 MG tablet Take 1 tablet (50 mg total) by mouth 2 (two) times daily.   No facility-administered encounter medications on file as of 07/19/2022.    Past Medical History:  Diagnosis Date   High cholesterol    Hypertension     Past Surgical History:  Procedure  Laterality Date   HERNIA REPAIR     TUBAL LIGATION     TUBAL LIGATION      Family History  Problem Relation Age of Onset   [name]    Stroke Brother    Heart attack Brother     Social History   Socioeconomic History   Marital status: Married    Spouse name: Not on file   Number of children: Not on file   Years of education: Not on file   Highest education level: Not on file  Occupational History   Not on file  Tobacco Use   Smoking status: Every Day    Packs/day: 0.50    Years: 25.00    Total pack years: 12.50    Types: Cigarettes   Smokeless tobacco: Never  Vaping Use   Vaping Use: Never used  Substance and Sexual Activity   Alcohol use: No    Alcohol/week: 0.0 standard drinks of alcohol   Drug use: No   Sexual activity: Yes    Birth control/protection: Surgical  Other Topics Concern   Not on file  Social History Narrative   Not on file   Social Determinants of Health   Financial Resource Strain: Not on file  Food Insecurity: Not on file  Transportation Needs: Not on file  Physical Activity: Not on file  Stress: Not on file  Social Connections: Not on file  Intimate Partner Violence: Not on file   Review of Systems  Constitutional:  Negative for chills and fever.  HENT:  Negative for sore throat.   Respiratory:  Negative for cough and shortness of breath.   Cardiovascular:  Negative for chest pain, palpitations and leg swelling.  Gastrointestinal:  Negative for abdominal pain, blood in stool, constipation, diarrhea, nausea and vomiting.  Genitourinary:  Negative for dysuria and hematuria.  Musculoskeletal:  Negative for myalgias.  Skin:  Negative for itching and rash.  Neurological:  Negative for dizziness and headaches.  Psychiatric/Behavioral:  Negative for depression and suicidal ideas. The patient has insomnia.    Objective    BP (!) 142/86   Pulse 74   Ht 4\' 11"  (1.499 m)   Wt 185 lb 9.6 oz (84.2 kg)   SpO2 98%   BMI 37.49 kg/m    Physical Exam Vitals reviewed.  Constitutional:      General: She is not in acute distress.    Appearance: Normal appearance. She is obese. She is not toxic-appearing.  HENT:     Head: Normocephalic and atraumatic.     Right Ear: External ear normal.     Left Ear: External ear normal.     Nose: Nose normal. No congestion or rhinorrhea.     Mouth/Throat:     Mouth: Mucous membranes are moist.     Pharynx: Oropharynx is clear. No oropharyngeal exudate or posterior oropharyngeal erythema.  Eyes:     General: No scleral icterus.    Extraocular Movements: Extraocular movements intact.     Conjunctiva/sclera: Conjunctivae normal.     Pupils: Pupils are equal, round, and reactive to light.  Cardiovascular:     Rate and Rhythm: Normal rate and regular rhythm.     Pulses: Normal pulses.     Heart sounds: Normal heart sounds. No murmur heard.    No friction rub. No gallop.  Pulmonary:     Effort: Pulmonary effort is normal.     Breath sounds: Normal breath sounds. No wheezing, rhonchi or rales.  Abdominal:     General: Abdomen is flat. Bowel sounds are normal. There is no distension.     Palpations: Abdomen is soft.     Tenderness: There is no abdominal tenderness.  Musculoskeletal:        General: No swelling. Normal range of motion.     Cervical back: Normal range of motion.     Right lower leg: No edema.     Left lower leg: No edema.  Lymphadenopathy:     Cervical: No cervical adenopathy.  Skin:    General: Skin is warm and dry.     Capillary Refill: Capillary refill takes less than 2 seconds.     Coloration: Skin is not jaundiced.  Neurological:     General: No focal deficit present.     Mental Status: She is alert and oriented to person, place, and time.  Psychiatric:        Mood and Affect: Mood normal.        Behavior: Behavior normal.    Last CBC Lab Results  Component Value Date   WBC 8.3 11/24/2015   HGB 13.0 11/24/2015   HCT 40.8 11/24/2015   MCV 85.2  11/24/2015   MCH 27.1 11/24/2015   RDW 14.9 11/24/2015   PLT 288 [id number]   Last metabolic panel Lab Results  Component Value Date   GLUCOSE 141 (H) 01/22/2022   NA 138 01/22/2022   K 3.4 (L) 01/22/2022   CL 106 01/22/2022   CO2 26 01/22/2022  BUN 13 01/22/2022   CREATININE 0.70 01/22/2022   GFRNONAA >60 01/22/2022   CALCIUM 8.7 (L) 01/22/2022   PROT 7.0 01/22/2022   ALBUMIN 3.6 01/22/2022   BILITOT 0.4 01/22/2022   ALKPHOS 80 01/22/2022   AST 16 01/22/2022   ALT 14 01/22/2022   ANIONGAP 6 01/22/2022   Last lipids Lab Results  Component Value Date   CHOL 187 01/22/2022   HDL 52 01/22/2022   LDLCALC 111 (H) 01/22/2022   TRIG 120 01/22/2022   CHOLHDL 3.6 01/22/2022   Last hemoglobin A1c Lab Results  Component Value Date   HGBA1C 5.7 (H) 01/28/2021    Assessment & Plan:   Problem List Items Addressed This Visit       Essential hypertension - Primary    BP 149/83 today.  She is currently prescribed amlodipine 10 mg daily and metoprolol tartrate 50 mg twice daily. -No medication changes today.  She will follow-up in 4 weeks for BP check      Hyperlipidemia    Currently prescribed Lipitor 20 mg daily.  This was a recent medication change as she was previously prescribed simvastatin 40 mg daily.  She has not started taking atorvastatin yet. -Lipid panel ordered today -Agree with atorvastatin      Cigarette nicotine dependence without complication    Current smoker.  She states that she smokes less than 0.5 packs/day and has been smoking since age 66.  She knows that she needs to quit and is trying to do so without the assistance of medication. -I congratulated her on taking the initial steps toward cessation and offered my assistance in any way that I can help.       Encounter for general adult medical examination with abnormal findings    Presenting today to establish care.  Previous records and labs reviewed. -Basic labs ordered today -Influenza vaccine  administered -We will update her preventative care list and further address outstanding items at follow-up in 4 weeks      Return in about 4 weeks (around 08/16/2022).   Johnette Abraham, MD

## 2022-07-19 NOTE — Assessment & Plan Note (Signed)
BP 149/83 today.  She is currently prescribed amlodipine 10 mg daily and metoprolol tartrate 50 mg twice daily. -No medication changes today.  She will follow-up in 4 weeks for BP check

## 2022-07-19 NOTE — Assessment & Plan Note (Signed)
Presenting today to establish care.  Previous records and labs reviewed. -Basic labs ordered today -Influenza vaccine administered -We will update her preventative care list and further address outstanding items at follow-up in 4 weeks

## 2022-07-26 DIAGNOSIS — R69 Illness, unspecified: Secondary | ICD-10-CM | POA: Diagnosis not present

## 2022-07-26 DIAGNOSIS — I10 Essential (primary) hypertension: Secondary | ICD-10-CM | POA: Diagnosis not present

## 2022-07-26 DIAGNOSIS — E785 Hyperlipidemia, unspecified: Secondary | ICD-10-CM | POA: Diagnosis not present

## 2022-07-26 DIAGNOSIS — Z1159 Encounter for screening for other viral diseases: Secondary | ICD-10-CM | POA: Diagnosis not present

## 2022-07-26 DIAGNOSIS — Z0001 Encounter for general adult medical examination with abnormal findings: Secondary | ICD-10-CM | POA: Diagnosis not present

## 2022-07-27 ENCOUNTER — Other Ambulatory Visit: Payer: Self-pay | Admitting: Internal Medicine

## 2022-07-27 DIAGNOSIS — E559 Vitamin D deficiency, unspecified: Secondary | ICD-10-CM

## 2022-07-27 LAB — TSH+FREE T4
Free T4: 1.08 ng/dL (ref 0.82–1.77)
TSH: 0.57 u[IU]/mL (ref 0.450–4.500)

## 2022-07-27 LAB — CBC WITH DIFFERENTIAL/PLATELET
Basophils Absolute: 0 10*3/uL (ref 0.0–0.2)
Basos: 0 %
EOS (ABSOLUTE): 0 10*3/uL (ref 0.0–0.4)
Eos: 0 %
Hematocrit: 43 % (ref 34.0–46.6)
Hemoglobin: 14.3 g/dL (ref 11.1–15.9)
Immature Grans (Abs): 0 10*3/uL (ref 0.0–0.1)
Immature Granulocytes: 0 %
Lymphocytes Absolute: 2.7 10*3/uL (ref 0.7–3.1)
Lymphs: 35 %
MCH: 26.4 pg — ABNORMAL LOW (ref 26.6–33.0)
MCHC: 33.3 g/dL (ref 31.5–35.7)
MCV: 79 fL (ref 79–97)
Monocytes Absolute: 0.5 10*3/uL (ref 0.1–0.9)
Monocytes: 6 %
Neutrophils Absolute: 4.5 10*3/uL (ref 1.4–7.0)
Neutrophils: 59 %
Platelets: 286 10*3/uL (ref 150–450)
RBC: 5.42 x10E6/uL — ABNORMAL HIGH (ref 3.77–5.28)
RDW: 12.5 % (ref 11.7–15.4)
WBC: 7.7 10*3/uL (ref 3.4–10.8)

## 2022-07-27 LAB — HEMOGLOBIN A1C
Est. average glucose Bld gHb Est-mCnc: 123 mg/dL
Hgb A1c MFr Bld: 5.9 % — ABNORMAL HIGH (ref 4.8–5.6)

## 2022-07-27 LAB — LIPID PANEL
Chol/HDL Ratio: 5.3 ratio — ABNORMAL HIGH (ref 0.0–4.4)
Cholesterol, Total: 263 mg/dL — ABNORMAL HIGH (ref 100–199)
HDL: 50 mg/dL (ref >39)
LDL Chol Calc (NIH): 193 mg/dL — ABNORMAL HIGH (ref 0–99)
Triglycerides: 111 mg/dL (ref 0–149)
VLDL Cholesterol Cal: 20 mg/dL (ref 5–40)

## 2022-07-27 LAB — CMP14+EGFR
ALT: 11 IU/L (ref 0–32)
AST: 12 IU/L (ref 0–40)
Albumin/Globulin Ratio: 1.4 (ref 1.2–2.2)
Albumin: 4 g/dL (ref 3.9–4.9)
Alkaline Phosphatase: 111 IU/L (ref 44–121)
BUN/Creatinine Ratio: 17 (ref 9–23)
BUN: 12 mg/dL (ref 6–24)
Bilirubin Total: 0.2 mg/dL (ref 0.0–1.2)
CO2: 24 mmol/L (ref 20–29)
Calcium: 9.5 mg/dL (ref 8.7–10.2)
Chloride: 102 mmol/L (ref 96–106)
Creatinine, Ser: 0.71 mg/dL (ref 0.57–1.00)
Globulin, Total: 2.9 g/dL (ref 1.5–4.5)
Glucose: 115 mg/dL — ABNORMAL HIGH (ref 70–99)
Potassium: 3.7 mmol/L (ref 3.5–5.2)
Sodium: 140 mmol/L (ref 134–144)
Total Protein: 6.9 g/dL (ref 6.0–8.5)
eGFR: 104 mL/min/{1.73_m2} (ref >59)

## 2022-07-27 LAB — B12 AND FOLATE PANEL
Folate: 3.9 ng/mL (ref >3.0)
Vitamin B-12: 652 pg/mL (ref 232–1245)

## 2022-07-27 LAB — HCV INTERPRETATION

## 2022-07-27 LAB — HCV AB W REFLEX TO QUANT PCR: HCV Ab: NONREACTIVE

## 2022-07-27 LAB — VITAMIN D 25 HYDROXY (VIT D DEFICIENCY, FRACTURES): Vit D, 25-Hydroxy: 7.2 ng/mL — ABNORMAL LOW (ref 30.0–100.0)

## 2022-07-27 LAB — HIV ANTIBODY (ROUTINE TESTING W REFLEX): HIV Screen 4th Generation wRfx: NONREACTIVE

## 2022-07-27 MED ORDER — VITAMIN D (ERGOCALCIFEROL) 1.25 MG (50000 UNIT) PO CAPS: 50000.0000 [IU] | ORAL_CAPSULE | ORAL | 0 refills | Status: AC

## 2022-08-16 ENCOUNTER — Ambulatory Visit: Payer: 59 | Admitting: Internal Medicine

## 2022-08-19 ENCOUNTER — Ambulatory Visit: Payer: 59 | Admitting: Internal Medicine

## 2022-08-23 ENCOUNTER — Other Ambulatory Visit: Payer: Self-pay | Admitting: Internal Medicine

## 2022-08-23 DIAGNOSIS — I10 Essential (primary) hypertension: Secondary | ICD-10-CM

## 2022-08-23 DIAGNOSIS — E785 Hyperlipidemia, unspecified: Secondary | ICD-10-CM

## 2022-09-14 ENCOUNTER — Ambulatory Visit: Payer: 59 | Admitting: Internal Medicine

## 2022-10-04 ENCOUNTER — Other Ambulatory Visit: Payer: Self-pay | Admitting: Internal Medicine

## 2022-10-04 DIAGNOSIS — I10 Essential (primary) hypertension: Secondary | ICD-10-CM

## 2022-11-12 ENCOUNTER — Other Ambulatory Visit: Payer: Self-pay | Admitting: Internal Medicine

## 2022-11-12 DIAGNOSIS — I10 Essential (primary) hypertension: Secondary | ICD-10-CM

## 2022-11-25 ENCOUNTER — Ambulatory Visit: Payer: 59 | Admitting: Internal Medicine

## 2022-11-25 ENCOUNTER — Encounter: Payer: Self-pay | Admitting: Internal Medicine

## 2022-12-02 ENCOUNTER — Encounter: Payer: Self-pay | Admitting: Internal Medicine

## 2022-12-02 ENCOUNTER — Ambulatory Visit (INDEPENDENT_AMBULATORY_CARE_PROVIDER_SITE_OTHER): Payer: Commercial Managed Care - HMO | Admitting: Internal Medicine

## 2022-12-02 VITALS — BP 124/69 | HR 77 | Ht 59.0 in | Wt 183.6 lb

## 2022-12-02 DIAGNOSIS — Z124 Encounter for screening for malignant neoplasm of cervix: Secondary | ICD-10-CM | POA: Insufficient documentation

## 2022-12-02 DIAGNOSIS — R7303 Prediabetes: Secondary | ICD-10-CM

## 2022-12-02 DIAGNOSIS — Z23 Encounter for immunization: Secondary | ICD-10-CM | POA: Diagnosis not present

## 2022-12-02 DIAGNOSIS — E785 Hyperlipidemia, unspecified: Secondary | ICD-10-CM

## 2022-12-02 DIAGNOSIS — E559 Vitamin D deficiency, unspecified: Secondary | ICD-10-CM | POA: Diagnosis not present

## 2022-12-02 DIAGNOSIS — Z1211 Encounter for screening for malignant neoplasm of colon: Secondary | ICD-10-CM | POA: Diagnosis not present

## 2022-12-02 DIAGNOSIS — Z1231 Encounter for screening mammogram for malignant neoplasm of breast: Secondary | ICD-10-CM | POA: Insufficient documentation

## 2022-12-02 DIAGNOSIS — I10 Essential (primary) hypertension: Secondary | ICD-10-CM

## 2022-12-02 DIAGNOSIS — F17210 Nicotine dependence, cigarettes, uncomplicated: Secondary | ICD-10-CM

## 2022-12-02 MED ORDER — VITAMIN D (ERGOCALCIFEROL) 1.25 MG (50000 UNIT) PO CAPS: 50000.0000 [IU] | ORAL_CAPSULE | ORAL | 0 refills | Status: DC

## 2022-12-02 MED ORDER — ATORVASTATIN CALCIUM 40 MG PO TABS: 40.0000 mg | ORAL_TABLET | Freq: Every day | ORAL | 3 refills | Status: DC

## 2022-12-02 NOTE — Patient Instructions (Signed)
It was a pleasure to see you today.  Thank you for giving [place] the opportunity to be involved in your care.  Below is a brief recap of your visit and next steps.  We will plan to see you again in 3 months.  Summary Increase atorvastatin 40 mg  Start weekly vitamin D supplementation Cologuard ordered Mammogram ordered Schedule for pap smear with female provider Follow up in 3 months

## 2022-12-02 NOTE — Assessment & Plan Note (Signed)
Cologuard ordered today °

## 2022-12-02 NOTE — Assessment & Plan Note (Signed)
A1c 5.9 on previous labs.  We reviewed lifestyle modifications aimed at weight loss and lowering her blood sugar.  She is motivated to begin regular exercise and is prepared to make dietary changes aimed at losing weight and improving her cholesterol.

## 2022-12-02 NOTE — Assessment & Plan Note (Signed)
She continues to smoke cigarettes but states that she is smoking less than 0.5 packs/day.  She continues to express an interest in smoking cessation but declines medication/NRT products today. -The patient was counseled on the dangers of tobacco use, and was advised to quit.  Reviewed strategies to maximize success, including removing cigarettes and smoking materials from environment, stress management, substitution of other forms of reinforcement, support of family/friends, and written materials.

## 2022-12-02 NOTE — Assessment & Plan Note (Signed)
Tdap administered today  

## 2022-12-02 NOTE — Assessment & Plan Note (Signed)
She is currently prescribed amlodipine 10 mg daily and metoprolol tartrate 50 mg twice daily.  BP today is 124/69. -No medication changes today.  Continue current regimen.

## 2022-12-02 NOTE — Assessment & Plan Note (Signed)
Due for pap smear. Last completed in 2018. She would prefer to have this performed by a female provider. -Will arrange one month follow up for pap smear with female provider

## 2022-12-02 NOTE — Assessment & Plan Note (Signed)
-   Screening mammogram ordered today.

## 2022-12-02 NOTE — Progress Notes (Signed)
Established Patient Office Visit  Subjective   Patient ID: Veronica Arnold, female    DOB: 1971-08-14  Age: 52 y.o. MRN: [id number]  Chief Complaint  Patient presents with   Hypertension    Follow up   Veronica Arnold returns to care today for follow-up.  She was last evaluated by me on 07/19/22 as a new patient presenting to establish care.  Her blood pressure was elevated at that time.  Baseline labs were ordered and 4-week follow-up was arranged.  There have been no acute interval events.  Veronica Arnold reports feeling well today.  She is asymptomatic and has no acute concerns to discuss.  Past Medical History:  Diagnosis Date   High cholesterol    Hypertension    Past Surgical History:  Procedure Laterality Date   HERNIA REPAIR     TUBAL LIGATION     TUBAL LIGATION     Social History   Tobacco Use   Smoking status: Every Day    Packs/day: 0.50    Years: 25.00    Additional pack years: 0.00    Total pack years: 12.50    Types: Cigarettes   Smokeless tobacco: Never  Vaping Use   Vaping Use: Never used  Substance Use Topics   Alcohol use: No    Alcohol/week: 0.0 standard drinks of alcohol   Drug use: No   Family History  Problem Relation Age of Onset   [name]    Stroke Brother    Heart attack Brother    No Known Allergies  Review of Systems  Constitutional:  Negative for chills and fever.  HENT:  Negative for sore throat.   Respiratory:  Negative for cough and shortness of breath.   Cardiovascular:  Negative for chest pain, palpitations and leg swelling.  Gastrointestinal:  Negative for abdominal pain, blood in stool, constipation, diarrhea, nausea and vomiting.  Genitourinary:  Negative for dysuria and hematuria.  Musculoskeletal:  Negative for myalgias.  Skin:  Negative for itching and rash.  Neurological:  Negative for dizziness and headaches.  Psychiatric/Behavioral:  Negative for depression and suicidal ideas.      Objective:     BP 124/69   Pulse  77   Ht 4\' 11"  (1.499 m)   Wt 183 lb 9.6 oz (83.3 kg)   SpO2 96%   BMI 37.08 kg/m  BP Readings from Last 3 Encounters:  12/02/22 124/69  07/19/22 (!) 142/86  01/26/22 140/90   Physical Exam Vitals reviewed.  Constitutional:      General: She is not in acute distress.    Appearance: Normal appearance. She is obese. She is not toxic-appearing.  HENT:     Head: Normocephalic and atraumatic.     Right Ear: External ear normal.     Left Ear: External ear normal.     Nose: Nose normal. No congestion or rhinorrhea.     Mouth/Throat:     Mouth: Mucous membranes are moist.     Pharynx: Oropharynx is clear. No oropharyngeal exudate or posterior oropharyngeal erythema.  Eyes:     General: No scleral icterus.    Extraocular Movements: Extraocular movements intact.     Conjunctiva/sclera: Conjunctivae normal.     Pupils: Pupils are equal, round, and reactive to light.  Cardiovascular:     Rate and Rhythm: Normal rate and regular rhythm.     Pulses: Normal pulses.     Heart sounds: Normal heart sounds. No murmur heard.    No friction rub.  No gallop.  Pulmonary:     Effort: Pulmonary effort is normal.     Breath sounds: Normal breath sounds. No wheezing, rhonchi or rales.  Abdominal:     General: Abdomen is flat. Bowel sounds are normal. There is no distension.     Palpations: Abdomen is soft.     Tenderness: There is no abdominal tenderness.  Musculoskeletal:        General: No swelling. Normal range of motion.     Cervical back: Normal range of motion.     Right lower leg: No edema.     Left lower leg: No edema.  Lymphadenopathy:     Cervical: No cervical adenopathy.  Skin:    General: Skin is warm and dry.     Capillary Refill: Capillary refill takes less than 2 seconds.     Coloration: Skin is not jaundiced.  Neurological:     General: No focal deficit present.     Mental Status: She is alert and oriented to person, place, and time.  Psychiatric:        Mood and Affect:  Mood normal.        Behavior: Behavior normal.   Last CBC Lab Results  Component Value Date   WBC 7.7 07/26/2022   HGB 14.3 07/26/2022   HCT 43.0 07/26/2022   MCV 79 07/26/2022   MCH 26.4 (L) 07/26/2022   RDW 12.5 07/26/2022   PLT 286 [id number]   Last metabolic panel Lab Results  Component Value Date   GLUCOSE 115 (H) 07/26/2022   NA 140 07/26/2022   K 3.7 07/26/2022   CL 102 07/26/2022   CO2 24 07/26/2022   BUN 12 07/26/2022   CREATININE 0.71 07/26/2022   EGFR 104 07/26/2022   CALCIUM 9.5 07/26/2022   PROT 6.9 07/26/2022   ALBUMIN 4.0 07/26/2022   LABGLOB 2.9 07/26/2022   AGRATIO 1.4 07/26/2022   BILITOT 0.2 07/26/2022   ALKPHOS 111 07/26/2022   AST 12 07/26/2022   ALT 11 07/26/2022   ANIONGAP 6 01/22/2022   Last lipids Lab Results  Component Value Date   CHOL 263 (H) 07/26/2022   HDL 50 07/26/2022   LDLCALC 193 (H) 07/26/2022   TRIG 111 07/26/2022   CHOLHDL 5.3 (H) 07/26/2022   Last hemoglobin A1c Lab Results  Component Value Date   HGBA1C 5.9 (H) 07/26/2022   Last thyroid functions Lab Results  Component Value Date   TSH 0.570 07/26/2022   Last vitamin D Lab Results  Component Value Date   VD25OH 7.2 (L) 07/26/2022   Last vitamin B12 and Folate Lab Results  Component Value Date   VITAMINB12 652 07/26/2022   FOLATE 3.9 07/26/2022   The 10-year ASCVD risk score (Arnett DK, et al., 2019) is: 8.1%    Assessment & Plan:   Problem List Items Addressed This Visit       Essential hypertension    She is currently prescribed amlodipine 10 mg daily and metoprolol tartrate 50 mg twice daily.  BP today is 124/69. -No medication changes today.  Continue current regimen.      Hyperlipidemia    Lipid panel updated in November.  Total cholesterol 263 and LDL 193.  Her 10-year ASCVD risk today is 8.1%.  She is currently prescribed atorvastatin 20 mg daily. -Increase atorvastatin to 40 mg daily given significant LDL elevation -Repeat lipid panel at  follow-up in 3 months      Cigarette nicotine dependence without complication    She continues  to smoke cigarettes but states that she is smoking less than 0.5 packs/day.  She continues to express an interest in smoking cessation but declines medication/NRT products today. -The patient was counseled on the dangers of tobacco use, and was advised to quit.  Reviewed strategies to maximize success, including removing cigarettes and smoking materials from environment, stress management, substitution of other forms of reinforcement, support of family/friends, and written materials.       Prediabetes    A1c 5.9 on previous labs.  We reviewed lifestyle modifications aimed at weight loss and lowering her blood sugar.  She is motivated to begin regular exercise and is prepared to make dietary changes aimed at losing weight and improving her cholesterol.      Vitamin D deficiency    Noted on previous labs.  High-dose, weekly vitamin D supplementation was prescribed but the prescription was not filled.  -New prescription for high-dose, weekly vitamin D supplementation x 12 weeks placed today -Repeat vitamin D level at follow-up in 3 months      Colon cancer screening    Cologuard ordered today      Breast cancer screening by mammogram    Screening mammogram ordered today      Screening for cervical cancer    Due for pap smear. Last completed in 2018. She would prefer to have this performed by a female provider. -Will arrange one month follow up for pap smear with female provider      Need for Tdap vaccination    Tdap administered today      Return in about 3 months (around 03/04/2023).   Johnette Abraham, MD

## 2022-12-02 NOTE — Assessment & Plan Note (Signed)
Noted on previous labs.  High-dose, weekly vitamin D supplementation was prescribed but the prescription was not filled.  -New prescription for high-dose, weekly vitamin D supplementation x 12 weeks placed today -Repeat vitamin D level at follow-up in 3 months

## 2022-12-02 NOTE — Assessment & Plan Note (Addendum)
Lipid panel updated in November.  Total cholesterol 263 and LDL 193.  Her 10-year ASCVD risk today is 8.1%.  She is currently prescribed atorvastatin 20 mg daily. -Increase atorvastatin to 40 mg daily given significant LDL elevation -Repeat lipid panel at follow-up in 3 months

## 2022-12-13 ENCOUNTER — Other Ambulatory Visit: Payer: Self-pay | Admitting: Internal Medicine

## 2022-12-13 DIAGNOSIS — I10 Essential (primary) hypertension: Secondary | ICD-10-CM

## 2023-01-05 LAB — COLOGUARD

## 2023-01-06 ENCOUNTER — Ambulatory Visit: Payer: Commercial Managed Care - HMO | Admitting: Family Medicine

## 2023-01-21 ENCOUNTER — Other Ambulatory Visit: Payer: Self-pay | Admitting: Physician Assistant

## 2023-01-21 DIAGNOSIS — I10 Essential (primary) hypertension: Secondary | ICD-10-CM

## 2023-01-25 ENCOUNTER — Other Ambulatory Visit: Payer: Self-pay

## 2023-01-25 DIAGNOSIS — I10 Essential (primary) hypertension: Secondary | ICD-10-CM

## 2023-01-25 MED ORDER — AMLODIPINE BESYLATE 10 MG PO TABS
ORAL_TABLET | ORAL | 3 refills | Status: DC
Start: 2023-01-25 — End: 2023-01-26

## 2023-01-26 ENCOUNTER — Encounter: Payer: Self-pay | Admitting: Internal Medicine

## 2023-01-26 ENCOUNTER — Ambulatory Visit (INDEPENDENT_AMBULATORY_CARE_PROVIDER_SITE_OTHER): Payer: Commercial Managed Care - HMO | Admitting: Internal Medicine

## 2023-01-26 VITALS — BP 135/67 | HR 72 | Resp 16 | Ht 59.0 in | Wt 185.0 lb

## 2023-01-26 DIAGNOSIS — R062 Wheezing: Secondary | ICD-10-CM

## 2023-01-26 DIAGNOSIS — I10 Essential (primary) hypertension: Secondary | ICD-10-CM

## 2023-01-26 DIAGNOSIS — E559 Vitamin D deficiency, unspecified: Secondary | ICD-10-CM | POA: Diagnosis not present

## 2023-01-26 MED ORDER — VITAMIN D (ERGOCALCIFEROL) 1.25 MG (50000 UNIT) PO CAPS
50000.0000 [IU] | ORAL_CAPSULE | ORAL | 0 refills | Status: AC
Start: 2023-01-26 — End: 2023-04-14

## 2023-01-26 MED ORDER — AMLODIPINE BESYLATE 10 MG PO TABS: ORAL_TABLET | ORAL | 1 refills | Status: DC

## 2023-01-26 MED ORDER — ALBUTEROL SULFATE HFA 108 (90 BASE) MCG/ACT IN AERS: 2.0000 | INHALATION_SPRAY | Freq: Four times a day (QID) | RESPIRATORY_TRACT | 0 refills | Status: AC | PRN

## 2023-01-26 MED ORDER — METOPROLOL TARTRATE 50 MG PO TABS: 50.0000 mg | ORAL_TABLET | Freq: Two times a day (BID) | ORAL | 1 refills | Status: DC

## 2023-01-26 NOTE — Patient Instructions (Signed)
Thank you, Veronica Arnold for allowing [place] to provide your care today.   I have sent your blood pressure medications, Vit D supplement , and albuterol inhaler to your pharmacy.  Follow up with Dr.Dixon in July and he can recheck your cholesterol and Vitamin D.     Thurmon Fair, M.D.

## 2023-01-26 NOTE — Progress Notes (Signed)
   HPI:Ms.Veronica Arnold is a 52 y.o. female who presents for evaluation of Medication Refill (Has been out of her bp meds for 2 days- needs them refilled ). For the details of today's visit, please refer to the assessment and plan.  Physical Exam: Vitals:   01/26/23 0823  BP: 135/67  Pulse: 72  Resp: 16  SpO2: 95%  Weight: 185 lb (83.9 kg)  Height: 4\' 11"  (1.499 m)     Physical Exam Constitutional:      Appearance: Normal appearance. She is obese.  Cardiovascular:     Rate and Rhythm: Normal rate and regular rhythm.     Heart sounds: No murmur heard. Pulmonary:     Effort: Pulmonary effort is normal.     Breath sounds: Wheezing present. No rales.      Assessment & Plan:   Veronica Arnold was seen today for medication refill.  Wheezing Assessment & Plan: Patient has a 5-pack-year history of tobacco use (10 years x 1/2 ppd). She is not ready to quit smoking. She is wheezing on exam today. She has not been symptomatic .  Prescribed albuterol PRN for shortness of breath    Essential hypertension Assessment & Plan: Patient's BP today is 135/67 with a goal of <140/80.  Pulse 72. She has been out of medication for 3 days. Systolic blood pressure in 120's when on ambulatory monitoring when taking antihypertensives daily.   No change to medications today Refilled amlodipine 10 mg daily and metoprolol tartrate 50 mg BID   Orders: -     amLODIPine Besylate; TAKE 1 Tablet BY MOUTH ONCE EVERY DAY  Dispense: 90 tablet; Refill: 1 -     Metoprolol Tartrate; Take 1 tablet (50 mg total) by mouth 2 (two) times daily.  Dispense: 180 tablet; Refill: 1  Vitamin D deficiency Assessment & Plan: Patient prescribed high-dose weekly vitamin D supplementation at her last visit but this was not at the pharmacy.  Reviewed records and this was sent to her [facility] pharmacy.   Resending Vitamin D 50,000 units weekly x 12 weeks  Orders: -     Vitamin D (Ergocalciferol); Take 1 capsule (50,000 Units  total) by mouth every 7 (seven) days for 12 doses.  Dispense: 12 capsule; Refill: 0  Other orders -     Albuterol Sulfate HFA; Inhale 2 puffs into the lungs every 6 (six) hours as needed for wheezing or shortness of breath.  Dispense: 8 g; Refill: 0      Milus Banister, MD

## 2023-01-26 NOTE — Assessment & Plan Note (Signed)
Patient has a 5-pack-year history of tobacco use (10 years x 1/2 ppd). She is not ready to quit smoking. She is wheezing on exam today. She has not been symptomatic .  Prescribed albuterol PRN for shortness of breath

## 2023-01-26 NOTE — Assessment & Plan Note (Signed)
Patient's BP today is 135/67 with a goal of <140/80.  Pulse 72. She has been out of medication for 3 days. Systolic blood pressure in 120's when on ambulatory monitoring when taking antihypertensives daily.   No change to medications today Refilled amlodipine 10 mg daily and metoprolol tartrate 50 mg BID

## 2023-01-26 NOTE — Assessment & Plan Note (Signed)
Patient prescribed high-dose weekly vitamin D supplementation at her last visit but this was not at the pharmacy.  Reviewed records and this was sent to her [facility] pharmacy.   Resending Vitamin D 50,000 units weekly x 12 weeks

## 2023-02-07 ENCOUNTER — Ambulatory Visit (HOSPITAL_COMMUNITY): Payer: Commercial Managed Care - HMO

## 2023-03-07 ENCOUNTER — Ambulatory Visit: Payer: Commercial Managed Care - HMO | Admitting: Internal Medicine

## 2023-04-26 ENCOUNTER — Ambulatory Visit (INDEPENDENT_AMBULATORY_CARE_PROVIDER_SITE_OTHER): Payer: 59 | Admitting: Family Medicine

## 2023-04-26 ENCOUNTER — Encounter: Payer: Self-pay | Admitting: Family Medicine

## 2023-04-26 VITALS — BP 121/76 | HR 78 | Resp 16 | Ht 59.0 in | Wt 187.0 lb

## 2023-04-26 DIAGNOSIS — M79671 Pain in right foot: Secondary | ICD-10-CM | POA: Diagnosis not present

## 2023-04-26 DIAGNOSIS — Z131 Encounter for screening for diabetes mellitus: Secondary | ICD-10-CM | POA: Diagnosis not present

## 2023-04-26 MED ORDER — PREDNISONE 20 MG PO TABS
20.0000 mg | ORAL_TABLET | Freq: Two times a day (BID) | ORAL | 0 refills | Status: DC
Start: 2023-04-26 — End: 2023-05-11

## 2023-04-26 NOTE — Assessment & Plan Note (Addendum)
Most likely plantar fasciitis Prednisone 20 mg twice daily x 5 days Hemoglobin A1c ordered. Advise icing, stretching and modifying activities that cause pain. shoe inserts and NSAIDs for pain management. Discussed theThe RICE method: R: Rest the painful area for a few days. I: Ice the area for 20 minutes at a time to relieve inflammation. C: Compress the area with a soft wrap to reduce swelling. E: Elevate the area by putting the foot on a few pillows.   Referral placed to podiatry

## 2023-04-26 NOTE — Patient Instructions (Signed)
        Great to see you today.  I have refilled the medication(s) we provide.    - Please take medications as prescribed. - Follow up with your primary health provider if any health concerns arises. - If symptoms worsen please contact your primary care provider and/or visit the emergency department.  

## 2023-04-26 NOTE — Progress Notes (Signed)
Patient Office Visit   Subjective   Patient ID: Veronica Arnold, female    DOB: 12-Jun-1971  Age: 52 y.o. MRN: [id number]  CC:  Chief Complaint  Patient presents with   foot pain    Pain in right foot, hurts to walk sometime callus areas on bottom of foot     HPI Veronica Arnold 52 year old female, presents to the clinic for right plantar foot pain started a few months ago now worsening.She  has a past medical history of High cholesterol and Hypertension.  Right plantar foot pain There was no injury mechanism. The pain is present in the right foot. The quality of the pain is described as stabbing, cramping, burning and aching. The pain is at a severity of 9/10. The pain has been constant since onset. Associated symptoms include an inability to bear weight. Pertinent negatives include no loss of motion, loss of sensation, muscle weakness, numbness or tingling. She reports no foreign bodies present. The symptoms are aggravated by movement, weight bearing and palpation. She has tried heat, non-weight bearing and rest for the symptoms. The treatment provided mild relief.        Outpatient Encounter Medications as of 04/26/2023  Medication Sig   albuterol (VENTOLIN HFA) 108 (90 Base) MCG/ACT inhaler Inhale 2 puffs into the lungs every 6 (six) hours as needed for wheezing or shortness of breath.   amLODipine (NORVASC) 10 MG tablet TAKE 1 Tablet BY MOUTH ONCE EVERY DAY   atorvastatin (LIPITOR) 40 MG tablet Take 1 tablet (40 mg total) by mouth daily.   metoprolol tartrate (LOPRESSOR) 50 MG tablet Take 1 tablet (50 mg total) by mouth 2 (two) times daily.   predniSONE (DELTASONE) 20 MG tablet Take 1 tablet (20 mg total) by mouth 2 (two) times daily with a meal for 5 days.   No facility-administered encounter medications on file as of 04/26/2023.    Past Surgical History:  Procedure Laterality Date   HERNIA REPAIR     TUBAL LIGATION     TUBAL LIGATION      Review of Systems   Constitutional:  Negative for chills and fever.  Eyes:  Negative for blurred vision.  Respiratory:  Negative for shortness of breath.   Cardiovascular:  Negative for chest pain.  Musculoskeletal:  Positive for joint pain and myalgias.  Neurological:  Negative for dizziness and headaches.      Objective    BP 121/76   Pulse 78   Resp 16   Ht 4\' 11"  (1.499 m)   Wt 187 lb (84.8 kg)   SpO2 96%   BMI 37.77 kg/m   Physical Exam Vitals reviewed.  Constitutional:      General: She is not in acute distress.    Appearance: Normal appearance. She is not ill-appearing, toxic-appearing or diaphoretic.  HENT:     Head: Normocephalic.  Eyes:     General:        Right eye: No discharge.        Left eye: No discharge.     Conjunctiva/sclera: Conjunctivae normal.  Cardiovascular:     Rate and Rhythm: Normal rate.     Pulses: Normal pulses.     Heart sounds: Normal heart sounds.  Pulmonary:     Effort: Pulmonary effort is normal. No respiratory distress.     Breath sounds: Normal breath sounds.  Musculoskeletal:        General: Tenderness present. Normal range of motion.     Cervical  back: Normal range of motion.     Right foot: Normal range of motion. Tenderness present. No swelling.     Left foot: No swelling or tenderness.  Skin:    General: Skin is warm and dry.     Capillary Refill: Capillary refill takes less than 2 seconds.  Neurological:     General: No focal deficit present.     Mental Status: She is alert and oriented to person, place, and time.     Coordination: Coordination normal.     Gait: Gait normal.  Psychiatric:        Mood and Affect: Mood normal.       Assessment & Plan:  Screening for diabetes mellitus -     Hemoglobin A1c  Foot pain, right -     predniSONE; Take 1 tablet (20 mg total) by mouth 2 (two) times daily with a meal for 5 days.  Dispense: 10 tablet; Refill: 0 -     Ambulatory referral to Podiatry  Right foot pain Assessment & Plan: Most  likely plantar fasciitis Prednisone 20 mg twice daily x 5 days Hemoglobin A1c ordered. Advise icing, stretching and modifying activities that cause pain. shoe inserts and NSAIDs for pain management. Discussed theThe RICE method: R: Rest the painful area for a few days. I: Ice the area for 20 minutes at a time to relieve inflammation. C: Compress the area with a soft wrap to reduce swelling. E: Elevate the area by putting the foot on a few pillows.   Referral placed to podiatry     Return 2 weeks chronic follow up with Dr.Dixon.   Cruzita Lederer Newman Nip, FNP

## 2023-05-11 ENCOUNTER — Other Ambulatory Visit: Payer: Self-pay | Admitting: Family Medicine

## 2023-05-11 ENCOUNTER — Ambulatory Visit: Payer: 59 | Admitting: Podiatry

## 2023-05-11 DIAGNOSIS — M79671 Pain in right foot: Secondary | ICD-10-CM

## 2023-06-01 ENCOUNTER — Ambulatory Visit: Payer: 59 | Admitting: Internal Medicine

## 2023-06-13 ENCOUNTER — Ambulatory Visit (INDEPENDENT_AMBULATORY_CARE_PROVIDER_SITE_OTHER): Payer: 59 | Admitting: Podiatry

## 2023-06-13 DIAGNOSIS — Z91199 Patient's noncompliance with other medical treatment and regimen due to unspecified reason: Secondary | ICD-10-CM

## 2023-06-13 NOTE — Progress Notes (Signed)
No show

## 2023-06-27 ENCOUNTER — Ambulatory Visit: Payer: 59 | Admitting: Podiatry

## 2023-07-05 ENCOUNTER — Ambulatory Visit: Payer: BLUE CROSS/BLUE SHIELD | Admitting: Podiatry

## 2023-07-05 ENCOUNTER — Encounter: Payer: Self-pay | Admitting: Podiatry

## 2023-07-05 ENCOUNTER — Ambulatory Visit (INDEPENDENT_AMBULATORY_CARE_PROVIDER_SITE_OTHER): Payer: 59

## 2023-07-05 DIAGNOSIS — M778 Other enthesopathies, not elsewhere classified: Secondary | ICD-10-CM | POA: Diagnosis not present

## 2023-07-05 DIAGNOSIS — D2371 Other benign neoplasm of skin of right lower limb, including hip: Secondary | ICD-10-CM | POA: Diagnosis not present

## 2023-07-05 NOTE — Progress Notes (Signed)
  Subjective:  Patient ID: Veronica Arnold, female    DOB: Jun 01, 1971,   MRN: [id number]  Chief Complaint  Patient presents with   Foot Pain    RM#23 Right foot pain several years    52 y.o. female presents for concern of right foot pain that has been ongoing for several years. Relates pain at the bottom of her right fifth toe area. Relates she has had lesions before on her feet and had them trimmed and that helped. Relates lately they have been so painful they have been interfering with her work.   . Denies any other pedal complaints. Denies n/v/f/c.   Past Medical History:  Diagnosis Date   High cholesterol    Hypertension     Objective:  Physical Exam: Vascular: DP/PT pulses 2/4 bilateral. CFT <3 seconds. Normal hair growth on digits. No edema.  Skin. No lacerations or abrasions bilateral feet. Hyperkeratotic cored lesions x 2 noted to right lateral fifth metatarsal head and sub fourth metatarsal head with disruption of skin lines.  Musculoskeletal: MMT 5/5 bilateral lower extremities in DF, PF, Inversion and Eversion. Deceased ROM in DF of ankle joint.  Neurological: Sensation intact to light touch.   Assessment:   1. Benign neoplasm of skin of right foot      Plan:  Patient was evaluated and treated and all questions answered. -Discussed benign skin lesions with patient and treatment options.  -Hyperkeratotic tissue was debrided with chisel without incident.  -Applied salycylic acid treatment to area with dressing. Advised to remove bandaging tomorrow.  -Encouraged daily moisturizing -Discussed use of pumice stone -Advised good supportive shoes and inserts -Patient to return to office as needed or sooner if condition worsens.   Louann Sjogren, DPM

## 2023-07-12 ENCOUNTER — Ambulatory Visit: Payer: 59 | Admitting: Internal Medicine

## 2023-08-19 ENCOUNTER — Ambulatory Visit: Payer: Self-pay | Admitting: Internal Medicine

## 2023-08-30 NOTE — Patient Instructions (Signed)

## 2023-08-30 NOTE — Progress Notes (Unsigned)
   Established Patient Office Visit   Subjective  Patient ID: Veronica Arnold, female    DOB: 1970-10-13  Age: 51 y.o. MRN: [id number]  No chief complaint on file.   She  has a past medical history of High cholesterol and Hypertension.  Hand Pain     ROS    Objective:     There were no vitals taken for this visit. {Vitals History (Optional):23777}  Physical Exam   No results found for any visits on 09/01/23.  The 10-year ASCVD risk score (Arnett DK, et al., 2019) is: 7.9%    Assessment & Plan:  There are no diagnoses linked to this encounter.  No follow-ups on file.   Cruzita Lederer Newman Nip, FNP

## 2023-09-01 ENCOUNTER — Encounter: Payer: Self-pay | Admitting: Family Medicine

## 2023-09-01 NOTE — Progress Notes (Signed)
NO SHOW

## 2023-09-06 ENCOUNTER — Other Ambulatory Visit: Payer: Self-pay | Admitting: Internal Medicine

## 2023-09-06 DIAGNOSIS — I10 Essential (primary) hypertension: Secondary | ICD-10-CM

## 2023-09-06 MED ORDER — METOPROLOL TARTRATE 50 MG PO TABS
50.0000 mg | ORAL_TABLET | Freq: Two times a day (BID) | ORAL | 1 refills | Status: DC
Start: 2023-09-06 — End: 2023-09-12

## 2023-09-06 NOTE — Telephone Encounter (Signed)
 Copied from CRM [id number]. Topic: Clinical - Medication Refill >> Sep 06, 2023 12:43 PM Antonio DEL wrote: Most Recent Primary Care Visit:  Provider: TERRY WILHELMENA LLOYD HILARIO  Department: [facility]  Visit Type: OFFICE VISIT  Date: 04/26/2023  Medication: metoprolol  tartrate (LOPRESSOR )  Has the patient contacted their pharmacy?  (Agent: If no, request that the patient contact the pharmacy for the refill. If patient does not wish to contact the pharmacy document the reason why and proceed with request.) (Agent: If yes, when and what did the pharmacy advise?)  Is this the correct pharmacy for this prescription? Yes If no, delete pharmacy and type the correct one.  This is the patient's preferred pharmacy:    [facility] [address], [place] - [address] [place] [address] [place] [address] Phone: [phone] Fax: [phone]    Has the prescription been filled recently? No  Is the patient out of the medication? Yes  Has the patient been seen for an appointment in the last year OR does the patient have an upcoming appointment?   Can we respond through MyChart?   Agent: Please be advised that Rx refills may take up to 3 business days. We ask that you follow-up with your pharmacy.

## 2023-09-12 ENCOUNTER — Other Ambulatory Visit: Payer: Self-pay | Admitting: Internal Medicine

## 2023-09-12 DIAGNOSIS — I10 Essential (primary) hypertension: Secondary | ICD-10-CM

## 2023-09-12 MED ORDER — METOPROLOL TARTRATE 50 MG PO TABS
50.0000 mg | ORAL_TABLET | Freq: Two times a day (BID) | ORAL | 1 refills | Status: DC
Start: 2023-09-12 — End: 2023-09-14

## 2023-09-12 NOTE — Telephone Encounter (Signed)
 Copied from CRM [id number]. Topic: Clinical - Medication Refill >> Sep 12, 2023 12:28 PM Chase BROCKS wrote: Most Recent Primary Care Visit:  Provider: TERRY WILHELMENA LLOYD HILARIO  Department: [facility]  Visit Type: OFFICE VISIT  Date: 04/26/2023  Medication: metoprolol  tartrate (LOPRESSOR ) 50 MG tablet  Has the patient contacted their pharmacy? Yes (Agent: If no, request that the patient contact the pharmacy for the refill. If patient does not wish to contact the pharmacy document the reason why and proceed with request.) (Agent: If yes, when and what did the pharmacy advise?)  Is this the correct pharmacy for this prescription? Yes If no, delete pharmacy and type the correct one.  This is the patient's preferred pharmacy:   [facility] [address], [place] - [address] [place] [address]  [place] [address] Phone: [phone] Fax: [phone]   Has the prescription been filled recently? Yes  Is the patient out of the medication? Yes  Has the patient been seen for an appointment in the last year OR does the patient have an upcoming appointment? Yes  Can we respond through MyChart? Yes  Agent: Please be advised that Rx refills may take up to 3 business days. We ask that you follow-up with your pharmacy.

## 2023-09-13 ENCOUNTER — Other Ambulatory Visit: Payer: Self-pay | Admitting: Internal Medicine

## 2023-09-13 DIAGNOSIS — I10 Essential (primary) hypertension: Secondary | ICD-10-CM

## 2023-09-13 NOTE — Telephone Encounter (Signed)
 Copied from CRM [id number]. Topic: Clinical - Medication Refill >> Sep 13, 2023  2:57 PM Carlatta H wrote: Most Recent Primary Care Visit:  Provider: TERRY WILHELMENA LLOYD HILARIO  Department: [facility]  Visit Type: OFFICE VISIT  Date: 04/26/2023  Medication: metoprolol  tartrate (LOPRESSOR ) 50 MG tablet [[id number]]  Has the patient contacted their pharmacy? No (Agent: If no, request that the patient contact the pharmacy for the refill. If patient does not wish to contact the pharmacy document the reason why and proceed with request.) (Agent: If yes, when and what did the pharmacy advise?)  Is this the correct pharmacy for this prescription? Yes If no, delete pharmacy and type the correct one.  This is the patient's preferred pharmacy:    [facility] [address], [place] - [address] [place] [address] [place] [address] Phone: [phone] Fax: [phone]     Has the prescription been filled recently? No  Is the patient out of the medication? Yes  Has the patient been seen for an appointment in the last year OR does the patient have an upcoming appointment? Yes  Can we respond through MyChart? Yes  Agent: Please be advised that Rx refills may take up to 3 business days. We ask that you follow-up with your pharmacy.

## 2023-09-14 ENCOUNTER — Other Ambulatory Visit: Payer: Self-pay

## 2023-09-14 DIAGNOSIS — I10 Essential (primary) hypertension: Secondary | ICD-10-CM

## 2023-09-14 MED ORDER — METOPROLOL TARTRATE 50 MG PO TABS
50.0000 mg | ORAL_TABLET | Freq: Two times a day (BID) | ORAL | 1 refills | Status: DC
Start: 2023-09-14 — End: 2024-03-12

## 2023-09-21 ENCOUNTER — Ambulatory Visit: Payer: Self-pay | Admitting: Family Medicine

## 2023-10-04 ENCOUNTER — Ambulatory Visit (INDEPENDENT_AMBULATORY_CARE_PROVIDER_SITE_OTHER): Payer: BLUE CROSS/BLUE SHIELD | Admitting: Internal Medicine

## 2023-10-04 ENCOUNTER — Encounter: Payer: Self-pay | Admitting: Internal Medicine

## 2023-10-04 VITALS — BP 122/74 | HR 63 | Ht 59.0 in | Wt 177.4 lb

## 2023-10-04 DIAGNOSIS — M79641 Pain in right hand: Secondary | ICD-10-CM | POA: Insufficient documentation

## 2023-10-04 DIAGNOSIS — M79642 Pain in left hand: Secondary | ICD-10-CM

## 2023-10-04 NOTE — Assessment & Plan Note (Signed)
Presenting today for an acute visit endorsing a 39-month history of bilateral hand pain with numbness/tingling at night.  Symptoms are worse in the right hand.  Her exam today is notable for positive Phalen's and Tinel's testing in the right hand.  Based on her description of symptoms and exam findings today, I suspect carpal tunnel syndrome is the underlying cause.  Treatment options were reviewed.  For now, she will wear a wrist brace on the right wrist nightly to see if symptoms improve.  She will return to care in 4-6 weeks for routine follow-up.

## 2023-10-04 NOTE — Progress Notes (Signed)
   Acute Office Visit  Subjective:     Patient ID: Veronica Arnold, female    DOB: 01/12/1971, 53 y.o.   MRN: [id number]  Chief Complaint  Patient presents with   Hand Pain    Pain, itching, burning in both hands, can not sleep. Patient concerned for arthritis    Veronica Arnold presents today for an acute visit endorsing a 50-month history of bilateral hand pain and numbness.  Symptoms are worse in the right hand.  She describes a burning, itching, numbness/tingling sensation at night.  Symptoms encompassed the entire hand and wake her up at least 3 times each night.  Symptoms are alleviated with hanging her arms off the bed and shaking them out.  She has tried wearing gloves and applying topical cream without significant symptom improvement.  She does not experience any symptoms during the day.  Symptoms are also worsened by braiding her granddaughters hair.  Review of Systems  Neurological:  Positive for tingling (Hands (R>L)).      Objective:    BP 122/74 (BP Location: Left Arm, Patient Position: Sitting, Cuff Size: Normal)   Pulse 63   Ht 4\' 11"  (1.499 m)   Wt 177 lb 6.4 oz (80.5 kg)   SpO2 95%   BMI 35.83 kg/m   Physical Exam Vitals reviewed.  Constitutional:      Appearance: Normal appearance.  Musculoskeletal:     Comments: No obvious deformity on inspection of each hand.  No thenar atrophy appreciated bilaterally.  ROM at the wrists and MCPs are intact bilaterally.  Positive Phalen's and Tinel's testing on the right.  Grip strength and sensation are intact.       Assessment & Plan:   Problem List Items Addressed This Visit       Bilateral hand pain - Primary   Presenting today for an acute visit endorsing a 41-month history of bilateral hand pain with numbness/tingling at night.  Symptoms are worse in the right hand.  Her exam today is notable for positive Phalen's and Tinel's testing in the right hand.  Based on her description of symptoms and exam findings today, I suspect  carpal tunnel syndrome is the underlying cause.  Treatment options were reviewed.  For now, she will wear a wrist brace on the right wrist nightly to see if symptoms improve.  She will return to care in 4-6 weeks for routine follow-up.      Return in about 4 weeks (around 11/01/2023).  Billie Lade, MD

## 2023-10-04 NOTE — Patient Instructions (Signed)
It was a pleasure to see you today.  Thank you for giving [place] the opportunity to be involved in your care.  Below is a brief recap of your visit and next steps.  We will plan to see you again in 4-6 weeks.  Summary I suspect that you have carpal tunnel syndrome in the right wrist As we discussed, I recommend purchasing a wrist brace at [facility]. Wear this nightly to see if symptoms improve. Return to care in 4-6 weeks for regular follow up and reassessment.

## 2023-11-01 ENCOUNTER — Other Ambulatory Visit: Payer: Self-pay

## 2023-11-01 DIAGNOSIS — I10 Essential (primary) hypertension: Secondary | ICD-10-CM

## 2023-11-01 MED ORDER — AMLODIPINE BESYLATE 10 MG PO TABS: ORAL_TABLET | ORAL | 1 refills | Status: DC

## 2023-11-11 ENCOUNTER — Ambulatory Visit: Payer: BLUE CROSS/BLUE SHIELD | Admitting: Internal Medicine

## 2023-12-21 ENCOUNTER — Other Ambulatory Visit: Payer: Self-pay | Admitting: Internal Medicine

## 2023-12-21 DIAGNOSIS — E785 Hyperlipidemia, unspecified: Secondary | ICD-10-CM

## 2023-12-28 ENCOUNTER — Ambulatory Visit (INDEPENDENT_AMBULATORY_CARE_PROVIDER_SITE_OTHER): Payer: Self-pay | Admitting: Internal Medicine

## 2023-12-28 ENCOUNTER — Encounter: Payer: Self-pay | Admitting: Internal Medicine

## 2023-12-28 VITALS — BP 133/80 | HR 76 | Ht 59.0 in | Wt 174.4 lb

## 2023-12-28 DIAGNOSIS — E669 Obesity, unspecified: Secondary | ICD-10-CM

## 2023-12-28 DIAGNOSIS — Z1231 Encounter for screening mammogram for malignant neoplasm of breast: Secondary | ICD-10-CM

## 2023-12-28 DIAGNOSIS — R7303 Prediabetes: Secondary | ICD-10-CM

## 2023-12-28 DIAGNOSIS — E559 Vitamin D deficiency, unspecified: Secondary | ICD-10-CM

## 2023-12-28 DIAGNOSIS — I10 Essential (primary) hypertension: Secondary | ICD-10-CM

## 2023-12-28 DIAGNOSIS — M7062 Trochanteric bursitis, left hip: Secondary | ICD-10-CM

## 2023-12-28 DIAGNOSIS — Z1211 Encounter for screening for malignant neoplasm of colon: Secondary | ICD-10-CM

## 2023-12-28 DIAGNOSIS — E785 Hyperlipidemia, unspecified: Secondary | ICD-10-CM

## 2023-12-28 MED ORDER — MELOXICAM 7.5 MG PO TABS: 7.5000 mg | ORAL_TABLET | Freq: Every day | ORAL | 0 refills | Status: AC

## 2023-12-28 MED ORDER — MELOXICAM 7.5 MG PO TABS: 7.5000 mg | ORAL_TABLET | Freq: Every day | ORAL | 0 refills | Status: DC

## 2023-12-28 MED ORDER — ATORVASTATIN CALCIUM 40 MG PO TABS: 40.0000 mg | ORAL_TABLET | Freq: Every day | ORAL | 3 refills | Status: DC

## 2023-12-28 NOTE — Assessment & Plan Note (Signed)
 Her acute concerns today is left lateral hip pain x 1 week.  On exam there is tenderness to palpation over the left greater trochanteric bursa, concerning for bursitis.  ROM of the left hip is generally intact.  Treatment options reviewed.  Meloxicam  7.5 mg daily x 1 week prescribed today.  Home information provided.  She will return to care if symptoms worsen or fail to improve.

## 2023-12-28 NOTE — Progress Notes (Signed)
 Established Patient Office Visit  Subjective   Patient ID: Veronica Arnold, female    DOB: 1970/12/29  Age: 53 y.o. MRN: [id number]  Chief Complaint  Patient presents with   Care Management    Four week follow up    Leg Pain    Left leg pain with numbness from hip down to the ankle    Veronica Arnold returns here today for follow-up.  She was last evaluated by me on 1/28 endorsing a 44-month history of bilateral hand pain with numbness and tingling at night.  Her symptoms were concerning for bilateral carpal tunnel syndrome.  Conservative treatment measures were reviewed and 4-6-week follow-up was recommended for routine care. There have been no acute interval events.  Veronica Arnold reports feeling fairly well today but endorses a 1 week history of left lateral hip pain.  She does not have any additional concerns to discuss  Past Medical History:  Diagnosis Date   High cholesterol    Hypertension    Past Surgical History:  Procedure Laterality Date   HERNIA REPAIR     TUBAL LIGATION     TUBAL LIGATION     Social History   Tobacco Use   Smoking status: Every Day    Current packs/day: 0.50    Average packs/day: 0.5 packs/day for 25.0 years (12.5 ttl pk-yrs)    Types: Cigarettes   Smokeless tobacco: Never  Vaping Use   Vaping status: Never Used  Substance Use Topics   Alcohol use: No    Alcohol/week: 0.0 standard drinks of alcohol   Drug use: No   Family History  Problem Relation Age of Onset   [name]    Stroke Brother    Heart attack Brother    No Known Allergies  Review of Systems  Musculoskeletal:  Positive for joint pain (Left lateral hip).  All other systems reviewed and are negative.    Objective:     BP 133/80   Pulse 76   Ht 4\' 11"  (1.499 m)   Wt 174 lb 6.4 oz (79.1 kg)   SpO2 95%   BMI 35.22 kg/m  BP Readings from Last 3 Encounters:  12/28/23 133/80  10/04/23 122/74  04/26/23 121/76   Physical Exam Vitals reviewed.  Constitutional:       General: She is not in acute distress.    Appearance: Normal appearance. She is obese. She is not toxic-appearing.  HENT:     Head: Normocephalic and atraumatic.     Right Ear: External ear normal.     Left Ear: External ear normal.     Nose: Nose normal. No congestion or rhinorrhea.     Mouth/Throat:     Mouth: Mucous membranes are moist.     Pharynx: Oropharynx is clear. No oropharyngeal exudate or posterior oropharyngeal erythema.  Eyes:     General: No scleral icterus.    Extraocular Movements: Extraocular movements intact.     Conjunctiva/sclera: Conjunctivae normal.     Pupils: Pupils are equal, round, and reactive to light.  Cardiovascular:     Rate and Rhythm: Normal rate and regular rhythm.     Pulses: Normal pulses.     Heart sounds: Normal heart sounds. No murmur heard.    No friction rub. No gallop.  Pulmonary:     Effort: Pulmonary effort is normal.     Breath sounds: Normal breath sounds. No wheezing, rhonchi or rales.  Abdominal:     General: Abdomen is flat. Bowel sounds  are normal. There is no distension.     Palpations: Abdomen is soft.     Tenderness: There is no abdominal tenderness.  Musculoskeletal:        General: Tenderness (Left greater trochanter) present. No swelling. Normal range of motion.     Cervical back: Normal range of motion.     Right lower leg: No edema.     Left lower leg: No edema.  Lymphadenopathy:     Cervical: No cervical adenopathy.  Skin:    General: Skin is warm and dry.     Capillary Refill: Capillary refill takes less than 2 seconds.     Coloration: Skin is not jaundiced.  Neurological:     General: No focal deficit present.     Mental Status: She is alert and oriented to person, place, and time.  Psychiatric:        Mood and Affect: Mood normal.        Behavior: Behavior normal.   Last CBC Lab Results  Component Value Date   WBC 7.7 07/26/2022   HGB 14.3 07/26/2022   HCT 43.0 07/26/2022   MCV 79 07/26/2022   MCH 26.4  (L) 07/26/2022   RDW 12.5 07/26/2022   PLT 286 07/26/2022   Last metabolic panel Lab Results  Component Value Date   GLUCOSE 115 (H) 07/26/2022   NA 140 07/26/2022   K 3.7 07/26/2022   CL 102 07/26/2022   CO2 24 07/26/2022   BUN 12 07/26/2022   CREATININE 0.71 07/26/2022   EGFR 104 07/26/2022   CALCIUM  9.5 07/26/2022   PROT 6.9 07/26/2022   ALBUMIN 4.0 07/26/2022   LABGLOB 2.9 07/26/2022   AGRATIO 1.4 07/26/2022   BILITOT 0.2 07/26/2022   ALKPHOS 111 07/26/2022   AST 12 07/26/2022   ALT 11 07/26/2022   ANIONGAP 6 01/22/2022   Last lipids Lab Results  Component Value Date   CHOL 263 (H) 07/26/2022   HDL 50 07/26/2022   LDLCALC 193 (H) 07/26/2022   TRIG 111 07/26/2022   CHOLHDL 5.3 (H) 07/26/2022   Last hemoglobin A1c Lab Results  Component Value Date   HGBA1C 5.9 (H) 07/26/2022   Last thyroid functions Lab Results  Component Value Date   TSH 0.570 07/26/2022   Last vitamin D  Lab Results  Component Value Date   VD25OH 7.2 (L) 07/26/2022   Last vitamin B12 and Folate Lab Results  Component Value Date   VITAMINB12 652 07/26/2022   FOLATE 3.9 07/26/2022   The 10-year ASCVD risk score (Arnett DK, et al., 2019) is: 11.4%    Assessment & Plan:   Problem List Items Addressed This Visit       Essential hypertension   Remains adequately controlled on current antihypertensive regimen consisting of amlodipine  10 mg daily and metoprolol  tartrate 50 mg twice daily.  No medication changes are indicated today.      Greater trochanteric bursitis of left hip   Her acute concerns today is left lateral hip pain x 1 week.  On exam there is tenderness to palpation over the left greater trochanteric bursa, concerning for bursitis.  ROM of the left hip is generally intact.  Treatment options reviewed.  Meloxicam  7.5 mg daily x 1 week prescribed today.  Home information provided.  She will return to care if symptoms worsen or fail to improve.      Hyperlipidemia   Lipid  panel last updated in November 2023.  Total cholesterol 263 and LDL 193.  Atorvastatin  40  mg daily was started in light of this result.  Repeat lipid panel ordered today.      Prediabetes   A1c 5.9 on labs from November 2023.  Repeat A1c ordered today.      Colon cancer screening - Primary   New Cologuard kit ordered today      Breast cancer screening by mammogram   Screening mammogram ordered today      Return in about 6 months (around 06/28/2024).   Tobi Fortes, MD

## 2023-12-28 NOTE — Assessment & Plan Note (Signed)
 Screening mammogram ordered today

## 2023-12-28 NOTE — Assessment & Plan Note (Signed)
New Cologuard kit ordered today.

## 2023-12-28 NOTE — Assessment & Plan Note (Signed)
 Remains adequately controlled on current antihypertensive regimen consisting of amlodipine  10 mg daily and metoprolol  tartrate 50 mg twice daily.  No medication changes are indicated today.

## 2023-12-28 NOTE — Assessment & Plan Note (Signed)
 A1c 5.9 on labs from November 2023.  Repeat A1c ordered today.

## 2023-12-28 NOTE — Patient Instructions (Signed)
 It was a pleasure to see you today.  Thank you for giving us  the opportunity to be involved in your care.  Below is a brief recap of your visit and next steps.  We will plan to see you again in 6 months.  Summary Meloxicam  x 1 week for left hip pain Please see attached information about hip bursitis Repeat labs ordered Follow up in 6 months

## 2023-12-28 NOTE — Assessment & Plan Note (Signed)
 Lipid panel last updated in November 2023.  Total cholesterol 263 and LDL 193.  Atorvastatin  40 mg daily was started in light of this result.  Repeat lipid panel ordered today.

## 2023-12-29 ENCOUNTER — Other Ambulatory Visit: Payer: Self-pay | Admitting: Internal Medicine

## 2023-12-29 ENCOUNTER — Encounter: Payer: Self-pay | Admitting: Internal Medicine

## 2023-12-29 DIAGNOSIS — E559 Vitamin D deficiency, unspecified: Secondary | ICD-10-CM

## 2023-12-29 LAB — LIPID PANEL
Chol/HDL Ratio: 3.1 ratio (ref 0.0–4.4)
Cholesterol, Total: 190 mg/dL (ref 100–199)
HDL: 61 mg/dL (ref >39)
LDL Chol Calc (NIH): 119 mg/dL — ABNORMAL HIGH (ref 0–99)
Triglycerides: 53 mg/dL (ref 0–149)
VLDL Cholesterol Cal: 10 mg/dL (ref 5–40)

## 2023-12-29 LAB — TSH+FREE T4
Free T4: 1.2 ng/dL (ref 0.82–1.77)
TSH: 0.652 u[IU]/mL (ref 0.450–4.500)

## 2023-12-29 LAB — CBC WITH DIFFERENTIAL/PLATELET
Basophils Absolute: 0 10*3/uL (ref 0.0–0.2)
Basos: 0 %
EOS (ABSOLUTE): 0 10*3/uL (ref 0.0–0.4)
Eos: 0 %
Hematocrit: 41.2 % (ref 34.0–46.6)
Hemoglobin: 13.4 g/dL (ref 11.1–15.9)
Immature Grans (Abs): 0 10*3/uL (ref 0.0–0.1)
Immature Granulocytes: 0 %
Lymphocytes Absolute: 2.9 10*3/uL (ref 0.7–3.1)
Lymphs: 38 %
MCH: 26.6 pg (ref 26.6–33.0)
MCHC: 32.5 g/dL (ref 31.5–35.7)
MCV: 82 fL (ref 79–97)
Monocytes Absolute: 0.6 10*3/uL (ref 0.1–0.9)
Monocytes: 7 %
Neutrophils Absolute: 4.2 10*3/uL (ref 1.4–7.0)
Neutrophils: 55 %
Platelets: 297 10*3/uL (ref 150–450)
RBC: 5.03 x10E6/uL (ref 3.77–5.28)
RDW: 14.2 % (ref 11.7–15.4)
WBC: 7.7 10*3/uL (ref 3.4–10.8)

## 2023-12-29 LAB — CMP14+EGFR
ALT: 12 IU/L (ref 0–32)
AST: 14 IU/L (ref 0–40)
Albumin: 4.2 g/dL (ref 3.9–4.9)
Alkaline Phosphatase: 100 IU/L (ref 44–121)
BUN/Creatinine Ratio: 16 (ref 9–23)
BUN: 10 mg/dL (ref 6–24)
Bilirubin Total: 0.3 mg/dL (ref 0.0–1.2)
CO2: 25 mmol/L (ref 20–29)
Calcium: 9.5 mg/dL (ref 8.7–10.2)
Chloride: 105 mmol/L (ref 96–106)
Creatinine, Ser: 0.64 mg/dL (ref 0.57–1.00)
Globulin, Total: 2.5 g/dL (ref 1.5–4.5)
Glucose: 94 mg/dL (ref 70–99)
Potassium: 4.2 mmol/L (ref 3.5–5.2)
Sodium: 141 mmol/L (ref 134–144)
Total Protein: 6.7 g/dL (ref 6.0–8.5)
eGFR: 108 mL/min/{1.73_m2} (ref >59)

## 2023-12-29 LAB — HEMOGLOBIN A1C
Est. average glucose Bld gHb Est-mCnc: 126 mg/dL
Hgb A1c MFr Bld: 6 % — ABNORMAL HIGH (ref 4.8–5.6)

## 2023-12-29 LAB — B12 AND FOLATE PANEL
Folate: 6.7 ng/mL (ref >3.0)
Vitamin B-12: 567 pg/mL (ref 232–1245)

## 2023-12-29 LAB — VITAMIN D 25 HYDROXY (VIT D DEFICIENCY, FRACTURES): Vit D, 25-Hydroxy: 15.3 ng/mL — ABNORMAL LOW (ref 30.0–100.0)

## 2023-12-29 MED ORDER — VITAMIN D (ERGOCALCIFEROL) 1.25 MG (50000 UNIT) PO CAPS: 50000.0000 [IU] | ORAL_CAPSULE | ORAL | 0 refills | Status: AC

## 2024-01-05 ENCOUNTER — Ambulatory Visit: Payer: Self-pay | Admitting: *Deleted

## 2024-01-05 ENCOUNTER — Telehealth: Payer: Self-pay

## 2024-01-05 ENCOUNTER — Other Ambulatory Visit: Payer: Self-pay

## 2024-01-05 DIAGNOSIS — M7062 Trochanteric bursitis, left hip: Secondary | ICD-10-CM

## 2024-01-05 DIAGNOSIS — M79671 Pain in right foot: Secondary | ICD-10-CM

## 2024-01-05 MED ORDER — MELOXICAM 15 MG PO TABS: 15.0000 mg | ORAL_TABLET | Freq: Every day | ORAL | 0 refills | Status: DC

## 2024-01-05 NOTE — Telephone Encounter (Signed)
 Copied from CRM [id number]. Topic: Clinical - Medication Question >> Jan 05, 2024 11:41 AM Opal Bill wrote: Reason for CRM: Pt was prescribed Meloxicam  7.5mg  for pain in leg, but it is not helping at all. She is asking if another medication can be prescribed.

## 2024-01-05 NOTE — Telephone Encounter (Signed)
  Chief Complaint: left hip pain not better with meloxicam  7.5 mg . Reviewed message from PCP meloxicam  increased dose to 15 mg .  Patient requesting referral to ortho / sports medicine Symptoms: pain continues and radiates to top of left thigh. Can not stand for long periods no more than 5- 10 minutes. Numbness reported lower part of leg and foot at times. Pain level 9/10. Limping with walking.  Not sleeping  Frequency:na  Pertinent Negatives: Patient denies chest pain no fever  Disposition: [] ED /[] Urgent Care (no appt availability in office) / [] Appointment(In office/virtual)/ []  [place] [facility]/ [x] Home Care/ [] Refused Recommended Disposition /[] [place] [facility]/ []  Follow-up with PCP Additional Notes:    Reviewed message from PCP with patient from today meloxicam  increased to 15 mg. Patient will try new dose. Patient concerned she will not be able to retun to work since difficulty standing and not sleeping due to pain. Pt requesting referral to ortho  due to now experiencing numbness in foot as well. Please advise if another appt needed.     Copied from CRM [id number]. Topic: Clinical - Red Word Triage >> Jan 05, 2024  2:24 PM Stanly Early wrote: Red Word that prompted transfer to Nurse Triage: pain and current meloxicam  15mg  tablet is not helping for her femur/hip been on medication for 4-5 days Reason for Disposition  [1] SEVERE pain (e.g., excruciating, unable to do any normal activities) AND [2] not improved after 2 hours of pain medicine  Answer Assessment - Initial Assessment Questions 1. LOCATION and RADIATION: "Where is the pain located?"      Left hip pian  2. QUALITY: "What does the pain feel like?"  (e.g., sharp, dull, aching, burning)     Aching  3. SEVERITY: "How bad is the pain?" "What does it keep you from doing?"   (Scale 1-10; or mild, moderate, severe)   -  MILD (1-3): doesn't interfere with normal activities    -  MODERATE (4-7): interferes with normal  activities (e.g., work or school) or awakens from sleep, limping    -  SEVERE (8-10): excruciating pain, unable to do any normal activities, unable to walk     9/10 . Not sleeping well limping  4. ONSET: "When did the pain start?" "Does it come and go, or is it there all the time?"     On going  5. WORK OR EXERCISE: "Has there been any recent work or exercise that involved this part of the body?"      Na  6. CAUSE: "What do you think is causing the hip pain?"      Na  7. AGGRAVATING FACTORS: "What makes the hip pain worse?" (e.g., walking, climbing stairs, running)     Walking  8. OTHER SYMPTOMS: "Do you have any other symptoms?" (e.g., back pain, pain shooting down leg,  fever, rash)     Left hip pain , pain shoots front of thigh,  can not stand long 5- 10 minutes before needing to sit down  Protocols used: Hip Pain-A-AH

## 2024-01-05 NOTE — Telephone Encounter (Signed)
 Referral placed.

## 2024-01-05 NOTE — Telephone Encounter (Signed)
 Patient advised.

## 2024-01-09 ENCOUNTER — Telehealth: Payer: Self-pay

## 2024-01-09 MED ORDER — PREDNISONE 10 MG (21) PO TBPK: ORAL_TABLET | ORAL | 0 refills | Status: DC

## 2024-01-09 NOTE — Telephone Encounter (Signed)
 Patient advised.

## 2024-01-09 NOTE — Telephone Encounter (Signed)
 Copied from CRM [id number]. Topic: Clinical - Red Word Triage >> Jan 05, 2024  2:24 PM Stanly Early wrote: Red Word that prompted transfer to Nurse Triage: pain and current meloxicam  15mg  tablet is not helping for her femur/hip been on medication for 4-5 days

## 2024-01-16 ENCOUNTER — Ambulatory Visit: Payer: Self-pay

## 2024-01-16 NOTE — Telephone Encounter (Signed)
 Patient calling in asking if referral can be checked on because she has not heard anything and the pain medication that was prescribed at last office visit is not helping with the leg pain at all. Patient advised referral office will contact her. Patient asking if something stronger can be called in for her pain in the meantime. Please advise.   Copied from CRM [id number]. Topic: Clinical - Red Word Triage >> Jan 16, 2024 11:46 AM Veronica Arnold wrote: Red Word that prompted transfer to Nurse Triage: Leg Pain, Unsure if should go to hospital. Answer Assessment - Initial Assessment Questions 1. REASON FOR CALL or QUESTION: "What is your reason for calling today?" or "How can I best help you?" or "What question do you have that I can help answer?"     Please see notes 2. CALLER: Document the source of call. (e.g., laboratory, patient).     Patient  Protocols used: PCP Call - No Triage-A-AH

## 2024-01-16 NOTE — Telephone Encounter (Signed)
 I have sent to [facility] at this time.  Also, sent MyChart message to Patient with Specialty Office contact information.

## 2024-01-17 ENCOUNTER — Ambulatory Visit: Payer: Self-pay

## 2024-01-17 NOTE — Telephone Encounter (Signed)
 Copied from CRM [id number]. Topic: Clinical - Red Word Triage >> Jan 17, 2024  4:48 PM Ethelle Herb L wrote: Red Word that prompted transfer to Nurse Triage: pain is worsening, pt unable to pick up rx sent in to help w/ pain due to cost.   Pt is rating pain at a 10.   Patient reports that she was prescribed Prednisone  recently for leg pain. She states she was unable to fill the prescription due to the price and states she is still experiencing pain to her leg. Patient requesting another medication be sent that would be more affordable if possible. Please advise.    Reason for Disposition  Prescription request for new medicine (not a refill)  Answer Assessment - Initial Assessment Questions 1. NAME of MEDICINE: "What medicine(s) are you calling about?"     Prednisone   2. QUESTION: "What is your question?" (e.g., double dose of medicine, side effect)     Can an alternative be sent, patient says it's too expensive 3. PRESCRIBER: "Who prescribed the medicine?" Reason: if prescribed by specialist, call should be referred to that group.     Dr. Kermit Ped 4. SYMPTOMS: "Do you have any symptoms?" If Yes, ask: "What symptoms are you having?"  "How bad are the symptoms (e.g., mild, moderate, severe)     Leg pain  Protocols used: Medication Question Call-A-AH

## 2024-01-18 NOTE — Telephone Encounter (Signed)
 Called patient and scheduled appointment.

## 2024-01-19 ENCOUNTER — Ambulatory Visit: Payer: Self-pay

## 2024-01-19 NOTE — Telephone Encounter (Signed)
  Chief Complaint: leg pain Symptoms: pain is severe, pain radiates to hip and down to knee Pertinent Negatives: Patient denies redness or swelling or warmth to the leg Disposition: [] ED /[] Urgent Care (no appt availability in office) / [] Appointment(In office/virtual)/ []  [place] [facility]/ [] Home Care/ [] Refused Recommended Disposition /[] [place] [facility]/ [x]  Follow-up with PCP Additional Notes: Called [facility] since no openings. Pt cannot come in for appointment. Pt canceled the appt that was scheduled for tomorrow. Advised that someone from office will call pt to discuss appt that she needs. Copied from CRM [id number]. Topic: Clinical - Medication Question >> Jan 19, 2024 11:33 AM Everlene Hobby D wrote: Patient wants to know if the doctor can call her in some medication for pain she had to cancel her appointment so she can go to work Reason for Disposition  [1] SEVERE pain (e.g., excruciating, unable to do any normal activities) AND [2] not improved after 2 hours of pain medicine    Pt unable to come to appt would need to be virtual but irtual [place] visits do not prescribe pain pills  Answer Assessment - Initial Assessment Questions 1. ONSET: "When did the pain start?"      chronic 2. LOCATION: "Where is the pain located?"      Leg pain referring to knee and up to hip 3. PAIN: "How bad is the pain?"    (Scale 1-10; or mild, moderate, severe)   -  MILD (1-3): doesn't interfere with normal activities    -  MODERATE (4-7): interferes with normal activities (e.g., work or school) or awakens from sleep, limping    -  SEVERE (8-10): excruciating pain, unable to do any normal activities, unable to walk     9  6. OTHER SYMPTOMS: "Do you have any other symptoms?" (e.g., chest pain, back pain, breathing difficulty, swelling, rash, fever, numbness, weakness)     no  Protocols used: Leg Pain-A-AH

## 2024-01-20 ENCOUNTER — Ambulatory Visit: Payer: Self-pay

## 2024-01-20 ENCOUNTER — Ambulatory Visit (INDEPENDENT_AMBULATORY_CARE_PROVIDER_SITE_OTHER): Payer: Self-pay

## 2024-01-20 VITALS — BP 132/80 | HR 83 | Ht 59.0 in | Wt 174.0 lb

## 2024-01-20 DIAGNOSIS — M5442 Lumbago with sciatica, left side: Secondary | ICD-10-CM

## 2024-01-20 MED ORDER — TRIAMCINOLONE ACETONIDE 40 MG/ML IJ SUSP
40.0000 mg | Freq: Once | INTRAMUSCULAR | Status: AC
  Administered 2024-01-20: 40 mg via INTRAMUSCULAR

## 2024-01-20 MED ORDER — KETOROLAC TROMETHAMINE 60 MG/2ML IM SOLN
60.0000 mg | Freq: Once | INTRAMUSCULAR | Status: AC
  Administered 2024-01-20: 60 mg via INTRAMUSCULAR

## 2024-01-20 MED ORDER — METHOCARBAMOL 500 MG PO TABS: 500.0000 mg | ORAL_TABLET | Freq: Three times a day (TID) | ORAL | 2 refills | Status: AC | PRN

## 2024-01-20 NOTE — Telephone Encounter (Signed)
 Pt has appointment to day, with Rice Chamorro. Will Discuss at appointment .

## 2024-01-20 NOTE — Progress Notes (Signed)
   Acute Office Visit  Subjective:     Patient ID: Herb Loges, female    DOB: 26-Apr-1971, 53 y.o.   MRN: [id number]  Chief Complaint  Patient presents with   Medical Management of Chronic Issues    Pt states "numbess below knee on left leg and pain from knee of the left left up to hip and back started about 3 weeks ago pain unbearable"    HPI Patient is in today for pain  ROS      Objective:    BP 132/80   Pulse 83   Ht 4\' 11"  (1.499 m)   Wt 174 lb (78.9 kg)   SpO2 96%   BMI 35.14 kg/m    Physical Exam Vitals and nursing note reviewed.  Constitutional:      Appearance: Normal appearance.  Eyes:     Extraocular Movements: Extraocular movements intact.     Pupils: Pupils are equal, round, and reactive to light.  Cardiovascular:     Rate and Rhythm: Normal rate and regular rhythm.  Pulmonary:     Effort: Pulmonary effort is normal.     Breath sounds: Normal breath sounds.  Musculoskeletal:     Cervical back: Normal, normal range of motion and neck supple.     Thoracic back: Normal.     Lumbar back: Tenderness present. Decreased range of motion. Positive right straight leg raise test. Negative left straight leg raise test.  Neurological:     Mental Status: She is alert and oriented to person, place, and time.  Psychiatric:        Mood and Affect: Mood normal.        Thought Content: Thought content normal.     No results found for any visits on 01/20/24.      Assessment & Plan:   Problem List Items Addressed This Visit   None Visit Diagnoses       Acute left-sided low back pain with left-sided sciatica    -  Primary   Relevant Medications   triamcinolone  acetonide (KENALOG -40) injection 40 mg (Completed)   ketorolac  (TORADOL ) injection 60 mg (Completed)   methocarbamol  (ROBAXIN ) 500 MG tablet   Other Relevant Orders   DG Arthro Hip Left       Meds ordered this encounter  Medications   triamcinolone  acetonide (KENALOG -40) injection 40 mg    ketorolac  (TORADOL ) injection 60 mg   methocarbamol  (ROBAXIN ) 500 MG tablet    Sig: Take 1 tablet (500 mg total) by mouth every 8 (eight) hours as needed (back pain).    Dispense:  30 tablet    Refill:  2    No follow-ups on file.  Alison Irvine, FNP

## 2024-01-24 DIAGNOSIS — G8929 Other chronic pain: Secondary | ICD-10-CM | POA: Insufficient documentation

## 2024-01-24 DIAGNOSIS — M5442 Lumbago with sciatica, left side: Secondary | ICD-10-CM | POA: Insufficient documentation

## 2024-02-03 ENCOUNTER — Ambulatory Visit (HOSPITAL_COMMUNITY)
Admission: RE | Admit: 2024-02-03 | Discharge: 2024-02-03 | Disposition: A | Discharge status: Home / Self Care | Payer: Self-pay | Source: Ambulatory Visit

## 2024-02-03 ENCOUNTER — Other Ambulatory Visit: Payer: Self-pay

## 2024-02-03 DIAGNOSIS — M5442 Lumbago with sciatica, left side: Secondary | ICD-10-CM

## 2024-02-06 ENCOUNTER — Ambulatory Visit: Payer: Self-pay

## 2024-02-06 NOTE — Telephone Encounter (Signed)
 Copied from CRM [id number]. Topic: Clinical - Red Word Triage >> Jan 17, 2024  4:48 PM Rennis Case wrote: Red Word that prompted transfer to Nurse Triage: pain is worsening, pt unable to pick up rx sent in to help w/ pain due to cost.   Pt is rating pain at a 10. >> Feb 06, 2024  3:27 PM Lizabeth Riggs wrote:  Acute left-sided low back pain with left-sided sciatica - Had an appointment on 01/20/24 Pain level is a 10 from her lower back to her feet (left side)   Chief Complaint: Pain, Left Leg Symptoms: Back Pain, Left Leg Pain, Knee Swelling Frequency: Acute Pertinent Negatives: Patient denies redness, fever,  Disposition: [] ED /[x] Urgent Care (no appt availability in office) / [x] Appointment(In office/virtual)/ []  [facility]/ [] Home Care/ [] Refused Recommended Disposition /[] [facility]/ []  Follow-up with PCP Additional Notes: Patient reports pain in Back and Extremities described as Throbbing and Radiating with onset Unknown onset and intensity rated Severe (8-10/10).  No trauma, but confirms swelling to the left knee, numbness, and a "walking on water," like feeling on occasion.  Reviewed self-care strategies including rest, positioning, OTC pain relief, and activity modification. Follow up with PCP within 1-2 days and Advised Urgent Care today    Reason for Disposition  [1] SEVERE pain (e.g., excruciating, unable to do any normal activities) AND [2] not improved after 2 hours of pain medicine  Answer Assessment - Initial Assessment Questions 1. ONSET: "When did the pain start?"      Ongoing  2. LOCATION: "Where is the pain located?"      Lower Back radiates to the Left Leg  3. PAIN: "How bad is the pain?"    (Scale 1-10; or mild, moderate, severe)   -  MILD (1-3): doesn't interfere with normal activities    -  MODERATE (4-7): interferes with normal activities (e.g., work or school) or awakens from sleep, limping    -  SEVERE (8-10): excruciating pain, unable to do any  normal activities, unable to walk     10 4. WORK OR EXERCISE: "Has there been any recent work or exercise that involved this part of the body?"      No  5. CAUSE: "What do you think is causing the leg pain?"     Unsure  6. OTHER SYMPTOMS: "Do you have any other symptoms?" (e.g., chest pain, back pain, breathing difficulty, swelling, rash, fever, numbness, weakness)     Numbness, Back Pain, Swelling  7. PREGNANCY: "Is there any chance you are pregnant?" "When was your last menstrual period?"     No and No  Protocols used: Leg Pain-A-AH

## 2024-02-06 NOTE — Telephone Encounter (Signed)
 Patient scheduled.

## 2024-02-08 ENCOUNTER — Ambulatory Visit: Payer: Self-pay

## 2024-02-08 VITALS — BP 122/76 | HR 70 | Ht 59.0 in | Wt 174.0 lb

## 2024-02-08 DIAGNOSIS — M5442 Lumbago with sciatica, left side: Secondary | ICD-10-CM

## 2024-02-08 DIAGNOSIS — M7062 Trochanteric bursitis, left hip: Secondary | ICD-10-CM | POA: Diagnosis not present

## 2024-02-08 MED ORDER — GABAPENTIN 300 MG PO CAPS
300.0000 mg | ORAL_CAPSULE | Freq: Two times a day (BID) | ORAL | 0 refills | Status: DC
Start: 2024-02-08 — End: 2024-03-30

## 2024-02-08 MED ORDER — METHYLPREDNISOLONE 4 MG PO TBPK: ORAL_TABLET | ORAL | 0 refills | Status: DC

## 2024-02-08 NOTE — Assessment & Plan Note (Signed)
 Patient returns today with no improvement in symptoms. She had x-ray of left hip and pelvis 5 days ago, however this has not been read at this time.  I reviewed images of hip and pelvis and did not see any obvious fracture or dislocation, however we will still see what radiologist has to say. Gabapentin and Medrol Dosepak added at this time. Ortho referral placed for further evaluation

## 2024-02-08 NOTE — Progress Notes (Signed)
 Established Patient Office Visit  Subjective   Patient ID: Veronica Arnold, female    DOB: Sep 13, 1970  Age: 53 y.o. MRN: [id number]  Chief Complaint  Patient presents with   Medical Management of Chronic Issues    Pt states "Still having back, and leg pain stated after triamcinolone  acetonide (KENALOG -40) injection 40 mg and ketorolac  (TORADOL ) injection 60 mg the next morning felt like the left leg was wet and it wasn't wet"      HPI  Patient Active Problem List   Diagnosis Date Noted   Acute left-sided low back pain with left-sided sciatica 01/24/2024   Greater trochanteric bursitis of left hip 12/28/2023   Bilateral hand pain 10/04/2023   Right foot pain 04/26/2023   Wheezing 01/26/2023   Prediabetes 12/02/2022   Vitamin D  deficiency 12/02/2022   Colon cancer screening 12/02/2022   Breast cancer screening by mammogram 12/02/2022   Screening for cervical cancer 12/02/2022   Need for Tdap vaccination 12/02/2022   Encounter for general adult medical examination with abnormal findings 07/19/2022   Essential hypertension 11/30/2015   Hyperlipidemia 11/30/2015   AP (abdominal pain) 11/30/2015   Obesity, unspecified 11/30/2015   Cigarette nicotine dependence without complication 11/30/2015      ROS    Objective:     BP 122/76   Pulse 70   Ht 4\' 11"  (1.499 m)   Wt 174 lb (78.9 kg)   SpO2 97%   BMI 35.14 kg/m  BP Readings from Last 3 Encounters:  02/08/24 122/76  01/20/24 132/80  12/28/23 133/80   Wt Readings from Last 3 Encounters:  02/08/24 174 lb (78.9 kg)  01/20/24 174 lb (78.9 kg)  12/28/23 174 lb 6.4 oz (79.1 kg)      Physical Exam Vitals and nursing note reviewed.  Constitutional:      Appearance: Normal appearance.  Musculoskeletal:     Cervical back: Normal.     Thoracic back: Normal.     Lumbar back: Tenderness present. Decreased range of motion. Positive right straight leg raise test. Negative left straight leg raise test.     Right hip:  Normal.     Left hip: Tenderness present. Decreased range of motion.     Right upper leg: Normal.     Left upper leg: Tenderness present.  Neurological:     Mental Status: She is alert and oriented to person, place, and time.  Psychiatric:        Mood and Affect: Mood normal.        Thought Content: Thought content normal.      No results found for any visits on 02/08/24.    The 10-year ASCVD risk score (Arnett DK, et al., 2019) is: 4.6%    Assessment & Plan:   Problem List Items Addressed This Visit       Nervous and Auditory   Acute left-sided low back pain with left-sided sciatica - Primary   Patient returns today with no improvement in symptoms. She had x-ray of left hip and pelvis 5 days ago, however this has not been read at this time.  I reviewed images of hip and pelvis and did not see any obvious fracture or dislocation, however we will still see what radiologist has to say. Gabapentin and Medrol Dosepak added at this time. Ortho referral placed for further evaluation      Relevant Medications   gabapentin (NEURONTIN) 300 MG capsule   methylPREDNISolone (MEDROL DOSEPAK) 4 MG TBPK tablet   Other Relevant  Orders   Ambulatory referral to Orthopedic Surgery     Musculoskeletal and Integument   Greater trochanteric bursitis of left hip   Patient returns today with no improvement in symptoms. She had x-ray of left hip and pelvis 5 days ago, however this has not been read at this time.  I reviewed images of hip and pelvis and did not see any obvious fracture or dislocation, however we will still see what radiologist has to say. Gabapentin and Medrol Dosepak added at this time. Ortho referral placed for further evaluation      Relevant Medications   gabapentin (NEURONTIN) 300 MG capsule   methylPREDNISolone (MEDROL DOSEPAK) 4 MG TBPK tablet   Other Relevant Orders   Ambulatory referral to Orthopedic Surgery    No follow-ups on file.    Alison Irvine, FNP

## 2024-02-13 ENCOUNTER — Ambulatory Visit: Payer: Self-pay

## 2024-03-08 ENCOUNTER — Ambulatory Visit: Payer: Self-pay

## 2024-03-08 NOTE — Telephone Encounter (Signed)
 FYI Only or Action Required?: Action required by provider: medication refill request.  Patient was last seen in primary care on 02/08/2024 by Bevely Doffing, FNP. Called Nurse Triage reporting Leg Pain. Symptoms began about a month ago. Interventions attempted: OTC medications: antiinflammatory  and Rest, hydration, or home remedies. Symptoms are: gradually worsening.  Triage Disposition: See HCP Within 4 Hours (Or PCP Triage)  Patient/caregiver understands and will follow disposition?: No, wishes to speak with PCP  Copied from CRM [id number]. Topic: Clinical - Red Word Triage >> Mar 08, 2024 10:09 AM Gustabo D wrote: Patient says she has arthritis and leg pain just the left and it's very painful and it's interfering with her job and house work. Reason for Disposition  [1] SEVERE pain (e.g., excruciating, unable to do any normal activities) AND [2] not improved after 2 hours of pain medicine  Answer Assessment - Initial Assessment Questions 1. ONSET: When did the pain start?      2 months ago 2. LOCATION: Where is the pain located?      Left leg from hip to knee 3. PAIN: How bad is the pain?    (Scale 1-10; or mild, moderate, severe)   -  MILD (1-3): doesn't interfere with normal activities    -  MODERATE (4-7): interferes with normal activities (e.g., work or school) or awakens from sleep, limping    -  SEVERE (8-10): excruciating pain, unable to do any normal activities, unable to walk     Moderate 4. WORK OR EXERCISE: Has there been any recent work or exercise that involved this part of the body?      denies 5. CAUSE: What do you think is causing the leg pain?     Arthritis  6. OTHER SYMPTOMS: Do you have any other symptoms? (e.g., chest pain, back pain, breathing difficulty, swelling, rash, fever, numbness, weakness)     Occasional shortness of breath, none current.  Additional info: Offered available acute visit today but patient declines. Requesting medication be called in  for pain relief. States antiinflammatory does not work.  Protocols used: Leg Pain-A-AH

## 2024-03-10 ENCOUNTER — Other Ambulatory Visit: Payer: Self-pay | Admitting: Internal Medicine

## 2024-03-10 DIAGNOSIS — I10 Essential (primary) hypertension: Secondary | ICD-10-CM

## 2024-03-11 ENCOUNTER — Other Ambulatory Visit: Payer: Self-pay

## 2024-03-11 DIAGNOSIS — M7062 Trochanteric bursitis, left hip: Secondary | ICD-10-CM

## 2024-03-11 MED ORDER — MELOXICAM 15 MG PO TABS: 15.0000 mg | ORAL_TABLET | Freq: Every day | ORAL | 2 refills | Status: DC

## 2024-03-21 ENCOUNTER — Ambulatory Visit: Admitting: Orthopaedic Surgery

## 2024-03-21 ENCOUNTER — Encounter: Payer: Self-pay | Admitting: Orthopaedic Surgery

## 2024-03-21 ENCOUNTER — Other Ambulatory Visit (INDEPENDENT_AMBULATORY_CARE_PROVIDER_SITE_OTHER): Payer: Self-pay

## 2024-03-21 VITALS — BP 143/82 | HR 65 | Ht 59.0 in | Wt 169.0 lb

## 2024-03-21 DIAGNOSIS — F17210 Nicotine dependence, cigarettes, uncomplicated: Secondary | ICD-10-CM | POA: Diagnosis not present

## 2024-03-21 DIAGNOSIS — M5432 Sciatica, left side: Secondary | ICD-10-CM

## 2024-03-21 MED ORDER — HYDROCODONE-ACETAMINOPHEN 5-325 MG PO TABS: ORAL_TABLET | ORAL | 0 refills | Status: DC

## 2024-03-21 NOTE — Progress Notes (Signed)
 Subjective:    Patient ID: Veronica Arnold, female    DOB: 1971-04-17, 53 y.o.   MRN: [id number]  HPI She has pain of the left hip and leg.  She has pain going down the left hip to the thigh past the knee to the left foot.  This has been present about three months or so.  She saw her family doctor about this June 4.  I have reviewed the notes.  She also had X-rays of the left hip.  I have reviewed them as well.  I have independently reviewed and interpreted x-rays of this patient done at another site by another physician or qualified health professional.  She has tried Mobic , rest, heat, ice, rubs with no help.  The pain is getting worse. She has no weakness. She has been on prednisone  and robaxin  and gabapentin .   Review of Systems  Constitutional:  Positive for activity change.  Musculoskeletal:  Positive for arthralgias and back pain.  All other systems reviewed and are negative. For Review of Systems, all other systems reviewed and are negative.  The following is a summary of the past history medically, past history surgically, known current medicines, social history and family history.  This information is gathered electronically by the computer from prior information and documentation.  I review this each visit and have found including this information at this point in the chart is beneficial and informative.   Past Medical History:  Diagnosis Date   High cholesterol    Hypertension     Past Surgical History:  Procedure Laterality Date   HERNIA REPAIR     TUBAL LIGATION     TUBAL LIGATION      Current Outpatient Medications on File Prior to Visit  Medication Sig Dispense Refill   albuterol  (VENTOLIN  HFA) 108 (90 Base) MCG/ACT inhaler Inhale 2 puffs into the lungs every 6 (six) hours as needed for wheezing or shortness of breath. 8 g 0   amLODipine  (NORVASC ) 10 MG tablet TAKE 1 Tablet BY MOUTH ONCE EVERY DAY 90 tablet 1   atorvastatin  (LIPITOR) 40 MG tablet Take 1 tablet  (40 mg total) by mouth daily. 90 tablet 3   gabapentin  (NEURONTIN ) 300 MG capsule Take 1 capsule (300 mg total) by mouth 2 (two) times daily for 10 days. 20 capsule 0   meloxicam  (MOBIC ) 15 MG tablet Take 1 tablet (15 mg total) by mouth daily. 30 tablet 2   methocarbamol  (ROBAXIN ) 500 MG tablet Take 1 tablet (500 mg total) by mouth every 8 (eight) hours as needed (back pain). 30 tablet 2   methylPREDNISolone  (MEDROL  DOSEPAK) 4 MG TBPK tablet Take as package instructions. 1 each 0   metoprolol  tartrate (LOPRESSOR ) 50 MG tablet Take 1 tablet by mouth twice daily 180 tablet 0   No current facility-administered medications on file prior to visit.    Social History   Socioeconomic History   Marital status: Married    Spouse name: Not on file   Number of children: Not on file   Years of education: Not on file   Highest education level: Not on file  Occupational History   Not on file  Tobacco Use   Smoking status: Every Day    Current packs/day: 0.50    Average packs/day: 0.5 packs/day for 25.0 years (12.5 ttl pk-yrs)    Types: Cigarettes   Smokeless tobacco: Never  Vaping Use   Vaping status: Never Used  Substance and Sexual Activity   Alcohol use:  No    Alcohol/week: 0.0 standard drinks of alcohol   Drug use: No   Sexual activity: Yes    Birth control/protection: Surgical  Other Topics Concern   Not on file  Social History Narrative   Not on file   Social Drivers of Health   Financial Resource Strain: Not on file  Food Insecurity: Not on file  Transportation Needs: Not on file  Physical Activity: Not on file  Stress: Not on file  Social Connections: Not on file  Intimate Partner Violence: Not on file    Family History  Problem Relation Age of Onset   [name]    Stroke Brother    Heart attack Brother     BP (!) 143/82   Pulse 65   Ht 4' 11 (1.499 m)   Wt 169 lb 0.2 oz (76.7 kg)   BMI 34.14 kg/m   Body mass index is 34.14 kg/m.      Objective:    Physical Exam Vitals and nursing note reviewed. Exam conducted with a chaperone present.  Constitutional:      Appearance: She is well-developed.  HENT:     Head: Normocephalic and atraumatic.  Eyes:     Conjunctiva/sclera: Conjunctivae normal.     Pupils: Pupils are equal, round, and reactive to light.  Cardiovascular:     Rate and Rhythm: Normal rate and regular rhythm.  Pulmonary:     Effort: Pulmonary effort is normal.  Abdominal:     Palpations: Abdomen is soft.  Musculoskeletal:       Arms:     Cervical back: Normal range of motion and neck supple.  Skin:    General: Skin is warm and dry.  Neurological:     Mental Status: She is alert and oriented to person, place, and time.     Cranial Nerves: No cranial nerve deficit.     Motor: No abnormal muscle tone.     Coordination: Coordination normal.     Deep Tendon Reflexes: Reflexes are normal and symmetric. Reflexes normal.  Psychiatric:        Behavior: Behavior normal.        Thought Content: Thought content normal.        Judgment: Judgment normal.     X-rays were done of the left hip and lumbar spine, reported separately.      Assessment & Plan:   Encounter Diagnoses  Name Primary?   Left sided sciatica Yes   Nicotine dependence, cigarettes, uncomplicated    She will need PT for the lumbar spine.  She may need MRI.  I will see how she does in PT.  I have reviewed the [place]  Controlled Substance Reporting System web site prior to prescribing narcotic medicine for this patient.  Return in three weeks.  Call if any problem.  Precautions discussed.  Electronically Signed Lemond Stable, MD 7/16/202511:10 AM

## 2024-03-21 NOTE — Patient Instructions (Signed)
Smoking Tobacco Information, Adult Smoking tobacco can be harmful to your health. Tobacco contains a toxic colorless chemical called nicotine. Nicotine causes changes in your brain that make you want more and more. This is called addiction. This can make it hard to stop smoking once you start. Tobacco also has other toxic chemicals that can hurt your body and raise your risk of many cancers. Menthol or "lite" tobacco or cigarette brands are not safer than regular brands. How can smoking tobacco affect me? Smoking tobacco puts you at risk for: Cancer. Smoking is most commonly associated with lung cancer, but can also lead to cancer in other parts of the body. Chronic obstructive pulmonary disease (COPD). This is a long-term lung condition that makes it hard to breathe. It also gets worse over time. High blood pressure (hypertension), heart disease, stroke, heart attack, and lung infections, such as pneumonia. Cataracts. This is when the lenses in the eyes become clouded. Digestive problems. This may include peptic ulcers, heartburn, and gastroesophageal reflux disease (GERD). Oral health problems, such as gum disease, mouth sores, and tooth loss. Loss of taste and smell. Smoking also affects how you look and smell. Smoking may cause: Wrinkles. Yellow or stained teeth, fingers, and fingernails. Bad breath. Bad-smelling clothes and hair. Smoking tobacco can also affect your social life, because: It may be challenging to find places to smoke when away from home. Many workplaces, restaurants, hotels, and public places are tobacco-free. Smoking is expensive. This is due to the cost of tobacco and the long-term costs of treating health problems from smoking. Secondhand smoke may affect those around you. Secondhand smoke can cause lung cancer, breathing problems, and heart disease. Children of smokers have a higher risk for: Sudden infant death syndrome (SIDS). Ear infections. Lung infections. What  actions can I take to prevent health problems? Quit smoking  Do not start smoking. Quit if you already smoke. Do not replace cigarette smoking with vaping devices, such as e-cigarettes. Make a plan to quit smoking and commit to it. Look for programs to help you, and ask your health care provider for recommendations and ideas. Set a date and write down all the reasons you want to quit. Let your friends and family know you are quitting so they can help and support you. Consider finding friends who also want to quit. It can be easier to quit with someone else, so that you can support each other. Talk with your health care provider about using nicotine replacement medicines to help you quit. These include gum, lozenges, patches, sprays, or pills. If you try to quit but return to smoking, stay positive. It is common to slip up when you first quit, so take it one day at a time. Be prepared for cravings. When you feel the urge to smoke, chew gum or suck on hard candy. Lifestyle Stay busy. Take care of your body. Get plenty of exercise, eat a healthy diet, and drink plenty of water. Find ways to manage your stress, such as meditation, yoga, exercise, or time spent with friends and family. Ask your health care provider about having regular tests (screenings) to check for cancer. This may include blood tests, imaging tests, and other tests. Where to find support To get support to quit smoking, consider: Asking your health care provider for more information and resources. Joining a support group for people who want to quit smoking in your local community. There are many effective programs that may help you to quit. Calling the smokefree.gov counselor   helpline at 1-800-QUIT-NOW (1-800-784-8669). Where to find more information You may find more information about quitting smoking from: Centers for Disease Control and Prevention: cdc.gov/tobacco Smokefree.gov: smokefree.gov American Lung Association:  freedomfromsmoking.org Contact a health care provider if: You have problems breathing. Your lips, nose, or fingers turn blue. You have chest pain. You are coughing up blood. You feel like you will faint. You have other health changes that cause you to worry. Summary Smoking tobacco can negatively affect your health, the health of those around you, your finances, and your social life. Do not start smoking. Quit if you already smoke. If you need help quitting, ask your health care provider. Consider joining a support group for people in your local community who want to quit smoking. There are many effective programs that may help you to quit. This information is not intended to replace advice given to you by your health care provider. Make sure you discuss any questions you have with your health care provider. Document Revised: 08/18/2021 Document Reviewed: 08/18/2021 Elsevier Patient Education  2024 Elsevier Inc.  

## 2024-03-27 ENCOUNTER — Telehealth: Payer: Self-pay

## 2024-03-27 NOTE — Telephone Encounter (Signed)
 Copied from CRM [id number]. Topic: Clinical - Medical Advice >> Mar 27, 2024  2:11 PM Veronica Arnold wrote: Reason for CRM:   She would like to discuss her left leg issue that is in her chart. She thinks she is not able to work anymore and wants to discuss it with her primary FNP. Please call her at [phone].   -----------------------------------------------------------------------

## 2024-03-29 ENCOUNTER — Ambulatory Visit: Payer: Self-pay

## 2024-03-29 NOTE — Telephone Encounter (Signed)
 Scheduled

## 2024-03-29 NOTE — Telephone Encounter (Signed)
   Patient was last seen in primary care on 02/08/2024 by Bevely Doffing, FNP.  Called Nurse Triage reporting Leg Pain.  Symptoms began several months ago.  Interventions attempted: Nothing.  Symptoms are: gradually worsening.  Triage Disposition: See HCP Within 24 Hours (Or PCP Triage)  Patient/caregiver understands and will follow disposition?: Yes         Copied from CRM #[id number]. Topic: Clinical - Red Word Triage >> Mar 29, 2024  9:06 AM Turkey B wrote: [facility] that prompted transfer to Nurse Triage: pt called in, still having severe pain in left leg Reason for Disposition  [1] SEVERE pain (e.g., excruciating, unable to do any normal activities) AND [2] not improved after 2 hours of pain medicine  Answer Assessment - Initial Assessment Questions 1. ONSET: When did the pain start?      3 months ago 2. LOCATION: Where is the pain located?      Left leg 3. PAIN: How bad is the pain?    (Scale 1-10; or mild, moderate, severe)     severe 4. WORK OR EXERCISE: Has there been any recent work or exercise that involved this part of the body?      Interferes with work 5. CAUSE: What do you think is causing the leg pain?     unknown 6. OTHER SYMPTOMS: Do you have any other symptoms? (e.g., chest pain, back pain, breathing difficulty, swelling, rash, fever, numbness, weakness)     Sob, numbness at the bottom of leg 7. PREGNANCY: Is there any chance you are pregnant? When was your last menstrual period?     na  Protocols used: Leg Pain-A-AH

## 2024-03-29 NOTE — Telephone Encounter (Signed)
 Called patient no voice mail set up

## 2024-03-30 ENCOUNTER — Telehealth: Payer: Self-pay

## 2024-03-30 ENCOUNTER — Ambulatory Visit (INDEPENDENT_AMBULATORY_CARE_PROVIDER_SITE_OTHER): Payer: Self-pay

## 2024-03-30 VITALS — BP 135/82 | HR 84 | Ht 59.0 in | Wt 169.0 lb

## 2024-03-30 DIAGNOSIS — I10 Essential (primary) hypertension: Secondary | ICD-10-CM

## 2024-03-30 DIAGNOSIS — M7062 Trochanteric bursitis, left hip: Secondary | ICD-10-CM | POA: Diagnosis not present

## 2024-03-30 DIAGNOSIS — M5442 Lumbago with sciatica, left side: Secondary | ICD-10-CM

## 2024-03-30 MED ORDER — AMLODIPINE BESYLATE 10 MG PO TABS: ORAL_TABLET | ORAL | 1 refills | Status: DC

## 2024-03-30 MED ORDER — PREGABALIN 75 MG PO CAPS: 75.0000 mg | ORAL_CAPSULE | Freq: Two times a day (BID) | ORAL | 0 refills | Status: DC

## 2024-03-30 NOTE — Telephone Encounter (Signed)
 Copied from CRM [id number]. Topic: General - Other >> Mar 30, 2024 12:53 PM Tobias L wrote: Reason for CRM: Patient states she is needing a note for her job stating she is not returning to work for good.   Patient requesting a call once note is available to pick up, [phone]

## 2024-03-30 NOTE — Progress Notes (Signed)
 Established Patient Office Visit  Subjective   Patient ID: Veronica Arnold, female    DOB: [birth year]  Age: 53 y.o. MRN: [id number]  Chief Complaint  Patient presents with   Medical Management of Chronic Issues    Pt here for left leg pain pt states when she is working the pain is at a 9 on a scale of 1-10. Been going on for 4 months now     HPI  Patient Active Problem List   Diagnosis Date Noted   Acute left-sided low back pain with left-sided sciatica 01/24/2024   Greater trochanteric bursitis of left hip 12/28/2023   Bilateral hand pain 10/04/2023   Right foot pain 04/26/2023   Wheezing 01/26/2023   Prediabetes 12/02/2022   Vitamin D  deficiency 12/02/2022   Colon cancer screening 12/02/2022   Breast cancer screening by mammogram 12/02/2022   Screening for cervical cancer 12/02/2022   Need for Tdap vaccination 12/02/2022   Encounter for general adult medical examination with abnormal findings 07/19/2022   Essential hypertension 11/30/2015   Hyperlipidemia 11/30/2015   AP (abdominal pain) 11/30/2015   Obesity, unspecified 11/30/2015   Cigarette nicotine dependence without complication 11/30/2015      ROS    Objective:     BP 135/82   Pulse 84   Ht 4' 11 (1.499 m)   Wt 169 lb 0.6 oz (76.7 kg)   SpO2 97%   BMI 34.14 kg/m  BP Readings from Last 3 Encounters:  03/30/24 135/82  03/21/24 (!) 143/82  02/08/24 122/76   Wt Readings from Last 3 Encounters:  03/30/24 169 lb 0.6 oz (76.7 kg)  03/21/24 169 lb 0.2 oz (76.7 kg)  02/08/24 174 lb (78.9 kg)      Physical Exam Vitals and nursing note reviewed.  Constitutional:      Appearance: Normal appearance.  Eyes:     Extraocular Movements: Extraocular movements intact.     Pupils: Pupils are equal, round, and reactive to light.  Cardiovascular:     Rate and Rhythm: Normal rate and regular rhythm.     Pulses: Normal pulses.  Pulmonary:     Effort: Pulmonary effort is normal.     Breath sounds: Normal  breath sounds.  Musculoskeletal:     Cervical back: Normal.     Thoracic back: Normal.     Lumbar back: Tenderness present. Decreased range of motion. Positive right straight leg raise test. Negative left straight leg raise test.     Right hip: Normal.     Left hip: Tenderness present. Decreased range of motion.     Right upper leg: Normal.     Left upper leg: Tenderness present.     Right lower leg: No edema.     Left lower leg: No edema.  Neurological:     Mental Status: She is alert and oriented to person, place, and time.  Psychiatric:        Mood and Affect: Mood normal.        Thought Content: Thought content normal.      No results found for any visits on 03/30/24.    The 10-year ASCVD risk score (Arnett DK, et al., 2019) is: 6.9%    Assessment & Plan:   Problem List Items Addressed This Visit       Cardiovascular and Mediastinum   Essential hypertension   Remains adequately controlled on current antihypertensive regimen consisting of amlodipine  10 mg daily and metoprolol  tartrate 50 mg twice daily.  No medication  changes are indicated today.      Relevant Medications   amLODipine  (NORVASC ) 10 MG tablet     Nervous and Auditory   Acute left-sided low back pain with left-sided sciatica - Primary   Patient returns today with no improvement in symptoms. She has not had improvement with gabapentin .  Will add Lyrica  in its place. She has upcoming appointment with Ortho for further evaluation. Letter provided for patient to stay out of work until further notice.  She states that she is unable to work with current pain level.      Relevant Medications   pregabalin  (LYRICA ) 75 MG capsule     Musculoskeletal and Integument   Greater trochanteric bursitis of left hip   Patient returns today with no improvement in symptoms. She has not had improvement with gabapentin .  Will add Lyrica  in its place. She has upcoming appointment with Ortho for further evaluation.        No follow-ups on file.    Leita Longs, FNP

## 2024-04-07 NOTE — Assessment & Plan Note (Addendum)
 Patient returns today with no improvement in symptoms. She has not had improvement with gabapentin .  Will add Lyrica  in its place. She has upcoming appointment with Ortho for further evaluation. Letter provided for patient to stay out of work until further notice.  She states that she is unable to work with current pain level.

## 2024-04-07 NOTE — Assessment & Plan Note (Signed)
 Remains adequately controlled on current antihypertensive regimen consisting of amlodipine  10 mg daily and metoprolol  tartrate 50 mg twice daily.  No medication changes are indicated today.

## 2024-04-07 NOTE — Assessment & Plan Note (Signed)
 Patient returns today with no improvement in symptoms. She has not had improvement with gabapentin .  Will add Lyrica  in its place. She has upcoming appointment with Ortho for further evaluation.

## 2024-04-18 ENCOUNTER — Ambulatory Visit: Payer: Self-pay

## 2024-04-18 NOTE — Telephone Encounter (Signed)
 FYI Only or Action Required?: FYI only for provider.  Patient was last seen in primary care on 03/30/2024 by Bevely Doffing, FNP.  Called Nurse Triage reporting Leg Pain.  Symptoms began several months ago.  Interventions attempted: Nothing.  Symptoms are: gradually worsening.  Triage Disposition: See Physician Within 24 Hours  Patient/caregiver understands and will follow disposition?: Yes, will follow disposition  Copied from CRM #[id number]. Topic: Clinical - Red Word Triage >> Apr 18, 2024  5:10 PM DeAngela L wrote: Red Word that prompted transfer to Nurse Triage: left leg pain starts in the lower back through the hips and down to the thigh area and then theres a burning sensation at the bottom of the leg and feels like water on her leg like if she was wet and it not really wet   Pt num [phone] (M) Reason for Disposition  Numbness in a leg or foot (i.e., loss of sensation)  Answer Assessment - Initial Assessment Questions 1. ONSET: When did the pain start?      Ongoing for 4 months 2. LOCATION: Where is the pain located?      L leg 3. PAIN: How bad is the pain?    (Scale 1-10; or mild, moderate, severe)     8 4. WORK OR EXERCISE: Has there been any recent work or exercise that involved this part of the body?      denies 5. CAUSE: What do you think is causing the leg pain?     unsure 6. OTHER SYMPTOMS: Do you have any other symptoms? (e.g., chest pain, back pain, breathing difficulty, swelling, rash, fever, numbness, weakness)     Numbness, burning sensation in the bottom of leg, feels wet.  Protocols used: Leg Pain-A-AH

## 2024-04-19 ENCOUNTER — Ambulatory Visit (INDEPENDENT_AMBULATORY_CARE_PROVIDER_SITE_OTHER): Admitting: Family Medicine

## 2024-04-19 VITALS — BP 126/81 | HR 68 | Resp 16 | Ht 59.0 in | Wt 168.0 lb

## 2024-04-19 DIAGNOSIS — M5442 Lumbago with sciatica, left side: Secondary | ICD-10-CM

## 2024-04-19 DIAGNOSIS — G8929 Other chronic pain: Secondary | ICD-10-CM | POA: Diagnosis not present

## 2024-04-19 MED ORDER — METHYLPREDNISOLONE ACETATE 80 MG/ML IJ SUSP
80.0000 mg | Freq: Once | INTRAMUSCULAR | Status: AC
  Administered 2024-04-19: 80 mg via INTRAMUSCULAR

## 2024-04-19 MED ORDER — OXYCODONE-ACETAMINOPHEN 5-325 MG PO TABS: 1.0000 | ORAL_TABLET | ORAL | 0 refills | Status: AC | PRN

## 2024-04-19 NOTE — Patient Instructions (Addendum)
 I appreciate the opportunity to provide care to you today!  Left-Sided Sciatica -Start Percocet 5/325 mg, one tablet by mouth every 6 hours as needed for severe pain rated greater than 7/10. -Avoid driving, operating heavy machinery, or consuming alcohol while taking Percocet due to risk of drowsiness and impaired judgment. -Encourage use of non-pharmacologic measures including heat/ice application, gentle stretching, and activity modification to reduce pain.  Monitor for worsening symptoms such as numbness, weakness, or loss of bladder/bowel control, and seek immediate care if these occur.    Please continue to a heart-healthy diet and increase your physical activities. Try to exercise for at least five days a week.    It was a pleasure to see you and I look forward to continuing to work together on your health and well-being. Please do not hesitate to call the office if you need care or have questions about your care.  In case of emergency, please visit the Emergency Department for urgent care, or contact our clinic at [phone] to schedule an appointment. We're here to help you!   Have a wonderful day and week. With Gratitude, Demitra Danley MSN, FNP-BC

## 2024-04-19 NOTE — Assessment & Plan Note (Signed)
 Depo- medrol  IM provided in the clinic Encouraged to start Percocet 5/325 mg, one tablet by mouth every 6 hours as needed for severe pain rated greater than 7/10. -Avoid driving, operating heavy machinery, or consuming alcohol while taking Percocet due to risk of drowsiness and impaired judgment. -Encourage use of non-pharmacologic measures including heat/ice application, gentle stretching, and activity modification to reduce pain.

## 2024-04-19 NOTE — Progress Notes (Signed)
 Established Patient Office Visit  Subjective:  Patient ID: Veronica Arnold, female    DOB: [birth year]  Age: 53 y.o. MRN: [id number]  CC:  Chief Complaint  Patient presents with   Back Pain    Pain in left lower back that goes into the hip and left leg. States lower leg is numb and feels like water is on her leg. Rates pain 10/10. Does not have anymore pain meds from Dr Brenna. Starts PT on Aug 29    HPI Veronica Arnold is a 53 y.o. female with past medical history of obesity presents  with the above complaints.   The patient presents with severe left lower back pain radiating into the left hip and leg. She reports numbness in the lower leg with a sensation "as if water is running over it." Pain is rated 10/10 in severity. She states she no longer has any pain medication from [name]. The patient is scheduled to begin physical therapy on August 29.    Past Medical History:  Diagnosis Date   High cholesterol    Hypertension     Past Surgical History:  Procedure Laterality Date   HERNIA REPAIR     TUBAL LIGATION     TUBAL LIGATION      Family History  Problem Relation Age of Onset   [name]    Stroke Brother    Heart attack Brother     Social History   Socioeconomic History   Marital status: Married    Spouse name: Not on file   Number of children: Not on file   Years of education: Not on file   Highest education level: 11th grade  Occupational History   Not on file  Tobacco Use   Smoking status: Every Day    Current packs/day: 0.50    Average packs/day: 0.5 packs/day for 25.0 years (12.5 ttl pk-yrs)    Types: Cigarettes   Smokeless tobacco: Never  Vaping Use   Vaping status: Never Used  Substance and Sexual Activity   Alcohol use: No    Alcohol/week: 0.0 standard drinks of alcohol   Drug use: No   Sexual activity: Yes    Birth control/protection: Surgical  Other Topics Concern   Not on file  Social History Narrative   Not on file   Social  Drivers of Health   Financial Resource Strain: High Risk (04/19/2024)   Overall Financial Resource Strain (CARDIA)    Difficulty of Paying Living Expenses: Very hard  Food Insecurity: Food Insecurity Present (04/19/2024)   Hunger Vital Sign    Worried About Running Out of Food in the Last Year: Sometimes true    Ran Out of Food in the Last Year: Often true  Transportation Needs: No Transportation Needs (04/19/2024)   PRAPARE - Administrator, Civil Service (Medical): No    Lack of Transportation (Non-Medical): No  Physical Activity: Inactive (04/19/2024)   Exercise Vital Sign    Days of Exercise per Week: 0 days    Minutes of Exercise per Session: Not on file  Stress: Stress Concern Present (04/19/2024)   [employer] of Occupational Health - Occupational Stress Questionnaire    Feeling of Stress: Rather much  Social Connections: Moderately Integrated (04/19/2024)   Social Connection and Isolation Panel    Frequency of Communication with Friends and Family: Three times a week    Frequency of Social Gatherings with Friends and Family: Once a week    Attends Religious  Services: More than 4 times per year    Active Member of Clubs or Organizations: No    Attends Engineer, structural: Not on file    Marital Status: Married  Catering manager Violence: Not on file    Outpatient Medications Prior to Visit  Medication Sig Dispense Refill   albuterol  (VENTOLIN  HFA) 108 (90 Base) MCG/ACT inhaler Inhale 2 puffs into the lungs every 6 (six) hours as needed for wheezing or shortness of breath. 8 g 0   amLODipine  (NORVASC ) 10 MG tablet TAKE 1 Tablet BY MOUTH ONCE EVERY DAY 90 tablet 1   atorvastatin  (LIPITOR) 40 MG tablet Take 1 tablet (40 mg total) by mouth daily. 90 tablet 3   meloxicam  (MOBIC ) 15 MG tablet Take 1 tablet (15 mg total) by mouth daily. 30 tablet 2   methocarbamol  (ROBAXIN ) 500 MG tablet Take 1 tablet (500 mg total) by mouth every 8 (eight) hours as needed  (back pain). 30 tablet 2   metoprolol  tartrate (LOPRESSOR ) 50 MG tablet Take 1 tablet by mouth twice daily 180 tablet 0   pregabalin  (LYRICA ) 75 MG capsule Take 1 capsule (75 mg total) by mouth 2 (two) times daily. 60 capsule 0   HYDROcodone -acetaminophen  (NORCO/VICODIN) 5-325 MG tablet One tablet every four hours for pain. (Patient not taking: Reported on 04/19/2024) 30 tablet 0   No facility-administered medications prior to visit.    Not on File  ROS Review of Systems  Constitutional:  Negative for chills and fever.  Eyes:  Negative for visual disturbance.  Respiratory:  Negative for cough, chest tightness and shortness of breath.   Musculoskeletal:  Positive for back pain.  Neurological:  Negative for dizziness and headaches.      Objective:    Physical Exam HENT:     Head: Normocephalic.     Mouth/Throat:     Mouth: Mucous membranes are moist.  Cardiovascular:     Rate and Rhythm: Normal rate.     Heart sounds: Normal heart sounds.  Pulmonary:     Effort: Pulmonary effort is normal.     Breath sounds: Normal breath sounds.  Musculoskeletal:     Lumbar back: Tenderness present.  Neurological:     Mental Status: She is alert.     BP 126/81   Pulse 68   Resp 16   Ht 4' 11 (1.499 m)   Wt 168 lb (76.2 kg)   SpO2 93%   BMI 33.93 kg/m  Wt Readings from Last 3 Encounters:  04/19/24 168 lb (76.2 kg)  03/30/24 169 lb 0.6 oz (76.7 kg)  03/21/24 169 lb 0.2 oz (76.7 kg)    Lab Results  Component Value Date   TSH 0.652 12/28/2023   Lab Results  Component Value Date   WBC 7.7 12/28/2023   HGB 13.4 12/28/2023   HCT 41.2 12/28/2023   MCV 82 12/28/2023   PLT 297 12/28/2023   Lab Results  Component Value Date   NA 141 12/28/2023   K 4.2 12/28/2023   CO2 25 12/28/2023   GLUCOSE 94 12/28/2023   BUN 10 12/28/2023   CREATININE 0.64 12/28/2023   BILITOT 0.3 12/28/2023   ALKPHOS 100 12/28/2023   AST 14 12/28/2023   ALT 12 12/28/2023   PROT 6.7 12/28/2023    ALBUMIN 4.2 12/28/2023   CALCIUM  9.5 12/28/2023   ANIONGAP 6 01/22/2022   EGFR 108 12/28/2023   Lab Results  Component Value Date   CHOL 190 12/28/2023   Lab Results  Component Value Date   HDL 61 12/28/2023   Lab Results  Component Value Date   LDLCALC 119 (H) 12/28/2023   Lab Results  Component Value Date   TRIG 53 12/28/2023   Lab Results  Component Value Date   CHOLHDL 3.1 12/28/2023   Lab Results  Component Value Date   HGBA1C 6.0 (H) 12/28/2023      Assessment & Plan:  Chronic left-sided low back pain with left-sided sciatica Assessment & Plan: Depo- medrol  IM provided in the clinic Encouraged to start Percocet 5/325 mg, one tablet by mouth every 6 hours as needed for severe pain rated greater than 7/10. -Avoid driving, operating heavy machinery, or consuming alcohol while taking Percocet due to risk of drowsiness and impaired judgment. -Encourage use of non-pharmacologic measures including heat/ice application, gentle stretching, and activity modification to reduce pain.  Orders: -     methylPREDNISolone  Acetate -     oxyCODONE -Acetaminophen ; Take 1 tablet by mouth every 4 (four) hours as needed for up to 5 days.  Dispense: 15 tablet; Refill: 0  Note: This chart has been completed using Engineer, civil (consulting) software, and while attempts have been made to ensure accuracy, certain words and phrases may not be transcribed as intended.    Follow-up: No follow-ups on file.   Damira Kem, FNP

## 2024-04-26 ENCOUNTER — Telehealth: Payer: Self-pay

## 2024-04-26 NOTE — Telephone Encounter (Signed)
 Copied from CRM #[id number]. Topic: General - Other >> Apr 26, 2024 12:37 PM Fonda T wrote: Reason for CRM: Received call from patient requesting to speak directly to office.  Offered assistance to patient, patient declined states she needed to speak directly to office regarding a claim that needed to be submitted in order for her to receive a gift card. Patient states office would know exactly what's needed.   Patient also given billing phone number to see if they could assist.   Patient requesting a return call from office, can be reached at [phone].

## 2024-04-26 NOTE — Telephone Encounter (Signed)
Do you happen to know what patient is referring to?

## 2024-04-27 ENCOUNTER — Encounter: Payer: Self-pay | Admitting: Radiology

## 2024-04-27 NOTE — Telephone Encounter (Signed)
 I do not.

## 2024-04-30 NOTE — Telephone Encounter (Signed)
 Patient states to receive gift card for her insurance they will need to process claim for last OV.

## 2024-05-02 ENCOUNTER — Other Ambulatory Visit: Payer: Self-pay

## 2024-05-02 ENCOUNTER — Telehealth: Payer: Self-pay | Admitting: Orthopaedic Surgery

## 2024-05-02 ENCOUNTER — Ambulatory Visit: Payer: Self-pay

## 2024-05-02 DIAGNOSIS — G8929 Other chronic pain: Secondary | ICD-10-CM

## 2024-05-02 MED ORDER — HYDROCODONE-ACETAMINOPHEN 10-325 MG PO TABS: 1.0000 | ORAL_TABLET | Freq: Four times a day (QID) | ORAL | 0 refills | Status: AC | PRN

## 2024-05-02 NOTE — Telephone Encounter (Signed)
 Returned the pts call, unable to lvm.

## 2024-05-02 NOTE — Telephone Encounter (Signed)
 Tried to return the pt's call again, unable to lvm

## 2024-05-02 NOTE — Telephone Encounter (Signed)
 FYI Only or Action Required?: Action required by provider: patient requesting medication for pain.  Patient was last seen in primary care on 04/19/2024 by Zarwolo, Gloria, FNP.  Called Nurse Triage reporting Leg Pain.  Symptoms began several months ago.   Symptoms are: gradually worsening.  Triage Disposition: See PCP Within 2 Weeks  Patient/caregiver understands and will follow disposition?: Yes         Copied from CRM [id number]. Topic: Clinical - Red Word Triage >> May 02, 2024 11:54 AM Terri MATSU wrote: Red Word that prompted transfer to Nurse Triage: Patient stated her leg is swelling up, painful, feeling numbness/tingle        Reason for Disposition  [1] MILD pain (e.g., does not interfere with normal activities) AND [2] present > 7 days  Answer Assessment - Initial Assessment Questions Patient is requesting pain medication to help until her appointment on 9/5. Please advise.        1. ONSET: When did the pain start?      5 months  2. LOCATION: Where is the pain located?      Left leg 3. PAIN: How bad is the pain?    (Scale 1-10; or mild, moderate, severe)     Mild to moderate 4. WORK OR EXERCISE: Has there been any recent work or exercise that involved this part of the body?      No 5. CAUSE: What do you think is causing the leg pain?     Arthritis  6. OTHER SYMPTOMS: Do you have any other symptoms? (e.g., chest pain, back pain, breathing difficulty, swelling, rash, fever, numbness, weakness)     Left back pain and hip pain, she states her arthritis is moving to her hands as well now  Protocols used: Leg Pain-A-AH

## 2024-05-03 NOTE — Therapy (Signed)
 OUTPATIENT PHYSICAL THERAPY THORACOLUMBAR EVALUATION   Patient Name: Veronica Arnold MRN: [id number] DOB:15-Mar-1971, 53 y.o., female Today's Date: 05/04/2024  END OF SESSION:  PT End of Session - 05/04/24 0928     Visit Number 1    Number of Visits 8    Date for PT Re-Evaluation 06/01/24    Authorization Type [employer]    Authorization Time Period auth paper filled out; please check on auth    PT Start Time 0930    PT Stop Time 1010    PT Time Calculation (min) 40 min    Activity Tolerance Patient tolerated treatment well    Behavior During Therapy [facility] for tasks assessed/performed          Past Medical History:  Diagnosis Date   High cholesterol    Hypertension    Past Surgical History:  Procedure Laterality Date   HERNIA REPAIR     TUBAL LIGATION     TUBAL LIGATION     Patient Active Problem List   Diagnosis Date Noted   Chronic left-sided low back pain with left-sided sciatica 01/24/2024   Greater trochanteric bursitis of left hip 12/28/2023   Bilateral hand pain 10/04/2023   Right foot pain 04/26/2023   Wheezing 01/26/2023   Prediabetes 12/02/2022   Vitamin D  deficiency 12/02/2022   Colon cancer screening 12/02/2022   Breast cancer screening by mammogram 12/02/2022   Need for Tdap vaccination 12/02/2022   Encounter for general adult medical examination with abnormal findings 07/19/2022   Essential hypertension 11/30/2015   Hyperlipidemia 11/30/2015   AP (abdominal pain) 11/30/2015   Cigarette nicotine dependence without complication 11/30/2015    PCP: Bevely Doffing FNP  REFERRING PROVIDER: Brenna Lin, MD  REFERRING DIAG: [phone] (ICD-10-CM) - Left sided sciatica  Rationale for Evaluation and Treatment: Rehabilitation  THERAPY DIAG:  Low back pain with left-sided sciatica, unspecified back pain laterality, unspecified chronicity  Difficulty in walking, not elsewhere classified  Low back pain, unspecified back pain laterality,  unspecified chronicity, unspecified whether sciatica present  Left leg weakness  ONSET DATE: 6 months or so  SUBJECTIVE:                                                                                                                                                                                           SUBJECTIVE STATEMENT: Pain started about 6 months ago.  Insidious onset; couldn't get up on morning.  Pain from left low back and hip all the way down to her left foot; lower leg feels wet.  Saw PCP first and then she sent to [name].  Had x-rays done  of hip and back and both showed arthritis.  Gave Prednisone ; did not help; [place] referred to therapy.  Has also had injections and those did not help.    PERTINENT HISTORY:  Bilateral hand pain  PAIN:  Are you having pain? Yes: NPRS scale: 8/10 Pain location: left side back and down left leg Pain description: feels wet Aggravating factors: standing too long, walking too long Relieving factors: sit, rest, meloxicam   PRECAUTIONS: None  WEIGHT BEARING RESTRICTIONS: No  FALLS:  Has patient fallen in last 6 months? No   OCCUPATION: CNA  PLOF: Independent  PATIENT GOALS: make me walk right  NEXT MD VISIT: PRN  OBJECTIVE:  Note: Objective measures were completed at Evaluation unless otherwise noted.  DIAGNOSTIC FINDINGS:  Clinical:  lower back pain with left sided sciatica, no trauma   X-rays were done of the lumbar spine, five views.   There is normal lumbar lordosis present of the lumbar spine.  There are small anterior osteophytes present L3 and L4.  Disc spaces are maintained.  No fracture is noted.  Bone quality is good.   Impression:  mild degenerative changes of the lumbar spine, no acute findings.   Electronically Signed Lemond Stable, MD 7/16/20259:40 AM  CLINICAL DATA:  Left hip pain.   EXAM: DG HIP (WITH OR WITHOUT PELVIS) 2-3V LEFT   COMPARISON:  None Available.   FINDINGS: Mild bilateral  sacroiliac subchondral sclerosis. Minimal bilateral superior femoroacetabular joint space narrowing. Mild bilateral superolateral left greater than right acetabular degenerative osteophytosis.   There are left greater than right superior lateral femoral head-neck junction likely degenerative subchondral cysts, measuring up to 13 mm on the left.   Minimal superior pubic symphysis degenerative spurring.   No acute fracture or dislocation.   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    PATIENT SURVEYS:  Modified Oswestry:  MODIFIED OSWESTRY DISABILITY SCALE  Date: 04/24/24 Score                                Total 32/50; 64%   Interpretation of scores: Score Category Description  0-20% Minimal Disability The patient can cope with most living activities. Usually no treatment is indicated apart from advice on lifting, sitting and exercise  21-40% Moderate Disability The patient experiences more pain and difficulty with sitting, lifting and standing. Travel and social life are more difficult and they may be disabled from work. Personal care, sexual activity and sleeping are not grossly affected, and the patient can usually be managed by conservative means  41-60% Severe Disability Pain remains the main problem in this group, but activities of daily living are affected. These patients require a detailed investigation  61-80% Crippled Back pain impinges on all aspects of the patient's life. Positive intervention is required  81-100% Bed-bound  These patients are either bed-bound or exaggerating their symptoms  [name], et al. Surgery versus conservative management of stable thoracolumbar fracture: the PRESTO feasibility RCT. [place] ([place]): [employer]; 2021 Nov. [facility] Technology Assessment, No. 25.62.) Appendix 3, Oswestry Disability Index category descriptors. Available from:  [employer]el[employer]cz  Minimally Clinically Important Difference (MCID) = 12.8%  COGNITION: Overall cognitive status: Within functional limits for tasks assessed     SENSATION: Decreased sensation left lower leg and foot to light touch  POSTURE: rounded shoulders, forward head, and increased lumbar lordosis  PALPATION: Very tender on palpation to lower lumber spine and left  hip and glute  LUMBAR ROM: * pain  AROM eval  Flexion Fingertips to distal pole of patella *  Extension 30% available*  Right lateral flexion Fingertips to 2 above knee joint line  Left lateral flexion 2 above knee joint line*  Right rotation   Left rotation    (Blank rows = not tested)  LOWER EXTREMITY ROM:     Active  Right eval Left eval  Hip flexion    Hip extension    Hip abduction    Hip adduction    Hip internal rotation    Hip external rotation    Knee flexion    Knee extension    Ankle dorsiflexion    Ankle plantarflexion    Ankle inversion    Ankle eversion     (Blank rows = not tested)  LOWER EXTREMITY MMT:    MMT Right eval Left eval  Hip flexion 4+ 3-  Hip extension    Hip abduction    Hip adduction    Hip internal rotation    Hip external rotation    Knee flexion    Knee extension 5 3+  Ankle dorsiflexion 5 4  Ankle plantarflexion    Ankle inversion    Ankle eversion     (Blank rows = not tested)   FUNCTIONAL TESTS:  5 times sit to stand: 23.91 sec using hands to assist up to standing  GAIT: Distance walked: 50 ft in clinic Assistive device utilized: Single point cane Level of assistance: Modified independence Comments: slight antalgic gait  TREATMENT DATE: 05/04/24 physical therapy evaluation and HEP instruction                                                                                                                                 PATIENT EDUCATION:  Education details: Patient educated on exam findings, POC, scope of PT,  HEP, and what to expect next visit. Person educated: Patient Education method: Explanation, Demonstration, and Handouts Education comprehension: verbalized understanding, returned demonstration, verbal cues required, and tactile cues required   HOME EXERCISE PROGRAM: Decompression exercises 1-5  ASSESSMENT:  CLINICAL IMPRESSION: Patient is a 53 y.o. female who was seen today for physical therapy evaluation and treatment for M54.32 (ICD-10-CM) - Left sided sciatica.  Patient demonstrates muscle weakness, reduced ROM, and fascial restrictions which are likely contributing to symptoms of pain and are negatively impacting patient ability to perform ADLs and functional mobility tasks. Patient will benefit from skilled physical therapy services to address these deficits to reduce pain and improve level of function with ADLs and functional mobility tasks.   OBJECTIVE IMPAIRMENTS: Abnormal gait, decreased activity tolerance, decreased mobility, difficulty walking, decreased ROM, decreased strength, impaired perceived functional ability, and pain.   ACTIVITY LIMITATIONS: carrying, lifting, bending, standing, squatting, sleeping, and locomotion level  PARTICIPATION LIMITATIONS: meal prep, cleaning, laundry, shopping, community activity, and occupation   REHAB POTENTIAL: Good  CLINICAL DECISION MAKING: Evolving/moderate complexity  EVALUATION COMPLEXITY: Moderate   GOALS: Goals reviewed with patient? No  SHORT TERM GOALS: Target date: 05/18/2024  patient will be independent with initial HEP  Baseline: Goal status: INITIAL  2.  Patient will report 30% improvement overall  Baseline:  Goal status: INITIAL  LONG TERM GOALS: Target date: 06/01/2024  Patient will be independent in self management strategies to improve quality of life and functional outcomes.  Baseline:  Goal status: INITIAL  2.  Patient will report 50% improvement overall  Baseline:  Goal status: INITIAL  3.   Patient will improve Modified Oswestry score by 12 points (20/50 or less) to demonstrate improved perceived function  Baseline: 32/50 Goal status: INITIAL  4.  Patient will improve 5 times sit to stand score to 15 sec or less to demonstrate improved functional mobility and increased leg strength.     Baseline: 23.91 sec Goal status: INITIAL  5.   Patient will increase bilateral leg MMT's to 4+ to 5/5 to allow navigation of steps without gait deviation or loss of balance  Baseline: see above Goal status: INITIAL   PLAN:  PT FREQUENCY: 2x/week  PT DURATION: 4 weeks  PLANNED INTERVENTIONS: 97164- PT Re-evaluation, 97110-Therapeutic exercises, 97530- Therapeutic activity, 97112- Neuromuscular re-education, 97535- Self Care, 02859- Manual therapy, [id number]- Gait training, [phone]- Orthotic Fit/training, [phone]- Canalith repositioning, [id number]- Aquatic Therapy, 97760- Splinting, [id number]- Wound care (first 20 sq cm), 97598- Wound care (each additional 20 sq cm)Patient/Family education, Balance training, Stair training, Taping, Dry Needling, Joint mobilization, Joint manipulation, Spinal manipulation, Spinal mobilization, Scar mobilization, and DME instructions. SABRA  PLAN FOR NEXT SESSION: Review HEP and goals; progress decompression exercises; pelvic tilt; core and postural strengthening; please test hip extension and hamstring strength.   11:06 AM, 05/04/24 Lizvette Lightsey Small Darold Miley MPT [facility] [place] [phone]

## 2024-05-04 ENCOUNTER — Other Ambulatory Visit: Payer: Self-pay

## 2024-05-04 ENCOUNTER — Ambulatory Visit (HOSPITAL_COMMUNITY)

## 2024-05-04 ENCOUNTER — Other Ambulatory Visit: Payer: Self-pay | Admitting: Internal Medicine

## 2024-05-04 DIAGNOSIS — M5432 Sciatica, left side: Secondary | ICD-10-CM | POA: Insufficient documentation

## 2024-05-04 DIAGNOSIS — M545 Low back pain, unspecified: Secondary | ICD-10-CM | POA: Diagnosis present

## 2024-05-04 DIAGNOSIS — R262 Difficulty in walking, not elsewhere classified: Secondary | ICD-10-CM | POA: Diagnosis present

## 2024-05-04 DIAGNOSIS — M5442 Lumbago with sciatica, left side: Secondary | ICD-10-CM | POA: Insufficient documentation

## 2024-05-04 DIAGNOSIS — R29898 Other symptoms and signs involving the musculoskeletal system: Secondary | ICD-10-CM | POA: Insufficient documentation

## 2024-05-04 DIAGNOSIS — F17210 Nicotine dependence, cigarettes, uncomplicated: Secondary | ICD-10-CM | POA: Insufficient documentation

## 2024-05-08 ENCOUNTER — Ambulatory Visit: Payer: Self-pay

## 2024-05-08 VITALS — BP 132/82 | HR 60 | Ht 59.0 in | Wt 170.0 lb

## 2024-05-08 DIAGNOSIS — I10 Essential (primary) hypertension: Secondary | ICD-10-CM

## 2024-05-08 DIAGNOSIS — E785 Hyperlipidemia, unspecified: Secondary | ICD-10-CM

## 2024-05-08 DIAGNOSIS — M5442 Lumbago with sciatica, left side: Secondary | ICD-10-CM

## 2024-05-08 DIAGNOSIS — R7303 Prediabetes: Secondary | ICD-10-CM | POA: Diagnosis not present

## 2024-05-08 DIAGNOSIS — G8929 Other chronic pain: Secondary | ICD-10-CM

## 2024-05-08 DIAGNOSIS — E559 Vitamin D deficiency, unspecified: Secondary | ICD-10-CM

## 2024-05-08 MED ORDER — PREGABALIN 75 MG PO CAPS: 75.0000 mg | ORAL_CAPSULE | Freq: Two times a day (BID) | ORAL | 2 refills | Status: DC

## 2024-05-08 NOTE — Progress Notes (Unsigned)
 Established Patient Office Visit  Subjective   Patient ID: Veronica Arnold, female    DOB: 04-11-1971  Age: 53 y.o. MRN: [id number]  Chief Complaint  Patient presents with   Medical Management of Chronic Issues    6 month follow up    HPI Discussed the use of AI scribe software for clinical note transcription with the patient, who gave verbal consent to proceed.  History of Present Illness   Veronica Arnold is a 53 year old female who presents for a six-month follow-up visit.  Chronic pain - Chronic pain prevents return to work. - Attempts to perform tasks exacerbate pain symptoms. - Currently undergoing therapy as part of pain management. - Takes pregabalin  (Lyrica ) twice daily for pain control.  Respiratory symptoms - Has an inhaler at home for respiratory issues.  Functional impairment and disability documentation - Requires documentation for work-related short-term or long-term disability due to inability to work secondary to chronic pain. - Has not received any paperwork from her company regarding disability.       Patient Active Problem List   Diagnosis Date Noted   Chronic left-sided low back pain with left-sided sciatica 01/24/2024   Greater trochanteric bursitis of left hip 12/28/2023   Bilateral hand pain 10/04/2023   Right foot pain 04/26/2023   Wheezing 01/26/2023   Prediabetes 12/02/2022   Vitamin D  deficiency 12/02/2022   Colon cancer screening 12/02/2022   Breast cancer screening by mammogram 12/02/2022   Need for Tdap vaccination 12/02/2022   Encounter for general adult medical examination with abnormal findings 07/19/2022   Essential hypertension 11/30/2015   Hyperlipidemia 11/30/2015   AP (abdominal pain) 11/30/2015   Cigarette nicotine dependence without complication 11/30/2015    ROS    Objective:     BP 132/82   Pulse 60   Ht 4' 11 (1.499 m)   Wt 170 lb 0.6 oz (77.1 kg)   SpO2 97%   BMI 34.34 kg/m  BP Readings from Last 3  Encounters:  05/08/24 132/82  04/19/24 126/81  03/30/24 135/82   Wt Readings from Last 3 Encounters:  05/08/24 170 lb 0.6 oz (77.1 kg)  04/19/24 168 lb (76.2 kg)  03/30/24 169 lb 0.6 oz (76.7 kg)     Physical Exam Vitals and nursing note reviewed.  Constitutional:      Appearance: Normal appearance.  Eyes:     Extraocular Movements: Extraocular movements intact.     Pupils: Pupils are equal, round, and reactive to light.  Cardiovascular:     Rate and Rhythm: Normal rate and regular rhythm.     Pulses: Normal pulses.  Pulmonary:     Effort: Pulmonary effort is normal.     Breath sounds: Normal breath sounds.  Musculoskeletal:     Cervical back: Normal.     Thoracic back: Normal.     Lumbar back: Tenderness present. Decreased range of motion. Positive right straight leg raise test. Negative left straight leg raise test.     Right hip: Normal.     Left hip: Tenderness present. Decreased range of motion.     Right upper leg: Normal.     Left upper leg: Tenderness present.     Right lower leg: No edema.     Left lower leg: No edema.  Neurological:     Mental Status: She is alert and oriented to person, place, and time.  Psychiatric:        Mood and Affect: Mood normal.  Thought Content: Thought content normal.      No results found for any visits on 05/08/24.  Last CBC Lab Results  Component Value Date   WBC 7.7 12/28/2023   HGB 13.4 12/28/2023   HCT 41.2 12/28/2023   MCV 82 12/28/2023   MCH 26.6 12/28/2023   RDW 14.2 12/28/2023   PLT 297 12/28/2023   Last metabolic panel Lab Results  Component Value Date   GLUCOSE 94 12/28/2023   NA 141 12/28/2023   K 4.2 12/28/2023   CL 105 12/28/2023   CO2 25 12/28/2023   BUN 10 12/28/2023   CREATININE 0.64 12/28/2023   EGFR 108 12/28/2023   CALCIUM  9.5 12/28/2023   PROT 6.7 12/28/2023   ALBUMIN 4.2 12/28/2023   LABGLOB 2.5 12/28/2023   AGRATIO 1.4 07/26/2022   BILITOT 0.3 12/28/2023   ALKPHOS 100  12/28/2023   AST 14 12/28/2023   ALT 12 12/28/2023   ANIONGAP 6 01/22/2022   Last lipids Lab Results  Component Value Date   CHOL 190 12/28/2023   HDL 61 12/28/2023   LDLCALC 119 (H) 12/28/2023   TRIG 53 12/28/2023   CHOLHDL 3.1 12/28/2023   Last hemoglobin A1c Lab Results  Component Value Date   HGBA1C 6.0 (H) 12/28/2023   Last thyroid functions Lab Results  Component Value Date   TSH 0.652 12/28/2023   Last vitamin D  Lab Results  Component Value Date   VD25OH 15.3 (L) 12/28/2023   Last vitamin B12 and Folate Lab Results  Component Value Date   VITAMINB12 567 12/28/2023   FOLATE 6.7 12/28/2023      The 10-year ASCVD risk score (Arnett DK, et al., 2019) is: 6.3%    Assessment & Plan:   Problem List Items Addressed This Visit       Cardiovascular and Mediastinum   Essential hypertension   Remains adequately controlled on current antihypertensive regimen consisting of amlodipine  10 mg daily and metoprolol  tartrate 50 mg twice daily.  No medication changes are indicated today.      Relevant Orders   CMP14+EGFR     Nervous and Auditory   Chronic left-sided low back pain with left-sided sciatica   Ongoing issue affecting ability to work, exacerbated by ADLs. She just started going to PT after evaluation by ortho by [name]. - Provided a letter for her to stay out of work, due to inability to perform work duties d/t ongoing left him and leg pain.  - Advise to check with workplace for any specific forms or documentation required.        Relevant Medications   pregabalin  (LYRICA ) 75 MG capsule     Other   Hyperlipidemia   Recheck fasting labs      Relevant Orders   CMP14+EGFR   Lipid Profile   Prediabetes   Recheck A1c level      Relevant Orders   HgB A1c   CMP14+EGFR   Vitamin D  deficiency - Primary   Recheck vitamin D  levels.      Relevant Orders   Vitamin D  (25 hydroxy)         Return in about 3 months (around 08/07/2024) for  chronic follow-up with PCP.    Leita Longs, FNP

## 2024-05-10 ENCOUNTER — Ambulatory Visit: Payer: Self-pay

## 2024-05-10 NOTE — Telephone Encounter (Signed)
 FYI Only or Action Required?: Action required by provider: request for appointment.  Patient was last seen in primary care on 05/08/2024 by Bevely Doffing, FNP.  Called Nurse Triage reporting gum pain and Dental Pain.  Symptoms began today.  Interventions attempted: Nothing.  Symptoms are: gradually worsening. Gum pain, swelling to left lower side. No availability in office and pt. Refuses another location. States she will go to ED.  Triage Disposition: See HCP Within 4 Hours (Or PCP Triage)  Patient/caregiver understands and will follow disposition?: Yes   Copied from CRM [id number]. Topic: Clinical - Red Word Triage >> May 10, 2024 11:16 AM Avram MATSU wrote: Red Word that prompted transfer to Nurse Triage: abscess on gum and its swollen and painful . Requesting anabiotics. Reason for Disposition  [1] SEVERE pain (e.g., excruciating, unable to eat, unable to do any normal activities) AND [2] not improved 2 hours after pain medicine  Answer Assessment - Initial Assessment Questions 1. LOCATION: Which tooth is hurting?  (e.g., right-side/left-side, upper/lower, front/back)     Left lower 2. ONSET: When did the toothache start?  (e.g., hours, days)      today 3. SEVERITY: How bad is the toothache?  (Scale 1-10; mild, moderate or severe)     8 4. SWELLING: Is there any visible swelling of your face?     yes 5. OTHER SYMPTOMS: Do you have any other symptoms? (e.g., fever)     no 6. PREGNANCY: Is there any chance you are pregnant? When was your last menstrual period?     no  Protocols used: Toothache-A-AH

## 2024-05-10 NOTE — Telephone Encounter (Signed)
 Patient advised to go to ED, but has appointment tomorrow 9/5 with chelsa

## 2024-05-11 ENCOUNTER — Ambulatory Visit: Payer: Self-pay | Admitting: Nurse Practitioner

## 2024-05-11 NOTE — Assessment & Plan Note (Signed)
 Recheck vitamin D  levels

## 2024-05-11 NOTE — Assessment & Plan Note (Signed)
Recheck fasting labs.  

## 2024-05-11 NOTE — Assessment & Plan Note (Signed)
 Ongoing issue affecting ability to work, exacerbated by ADLs. She just started going to PT after evaluation by ortho by [name]. - Provided a letter for her to stay out of work, due to inability to perform work duties d/t ongoing left him and leg pain.  - Advise to check with workplace for any specific forms or documentation required.

## 2024-05-11 NOTE — Assessment & Plan Note (Signed)
 Remains adequately controlled on current antihypertensive regimen consisting of amlodipine  10 mg daily and metoprolol  tartrate 50 mg twice daily.  No medication changes are indicated today.

## 2024-05-11 NOTE — Assessment & Plan Note (Signed)
Recheck A1c level

## 2024-05-15 ENCOUNTER — Other Ambulatory Visit: Payer: Self-pay

## 2024-05-15 ENCOUNTER — Telehealth: Payer: Self-pay

## 2024-05-15 DIAGNOSIS — G8929 Other chronic pain: Secondary | ICD-10-CM

## 2024-05-15 MED ORDER — HYDROCODONE-ACETAMINOPHEN 10-325 MG PO TABS: 1.0000 | ORAL_TABLET | Freq: Three times a day (TID) | ORAL | 0 refills | Status: DC | PRN

## 2024-05-15 NOTE — Telephone Encounter (Signed)
 Copied from CRM [id number]. Topic: Medical Record Request - Payor/Billing Request >> May 15, 2024 11:27 AM Veronica Arnold wrote: Reason for CRM: Pt called in about a notification she received from mychart about missed appointment    05/11/2024- appt should have been cancelled because pt was seen earlier on 05/08/2024. Pt received a call and was told there was an earlier appt and appt on 09/05 should have been cancelled.   Pls follow up with pt

## 2024-05-15 NOTE — Telephone Encounter (Unsigned)
 Copied from CRM #[id number]. Topic: Clinical - Medication Refill >> May 15, 2024 11:25 AM Ivette P wrote: Medication: HYDROcodone -acetaminophen  (NORCO) 10-325 MG tablet  Has the patient contacted their pharmacy? Yes (Agent: If no, request that the patient contact the pharmacy for the refill. If patient does not wish to contact the pharmacy document the reason why and proceed with request.) (Agent: If yes, when and what did the pharmacy advise?)  This is the patient's preferred pharmacy:  [facility] [address], [place] - [address] [place] [address] Phone: [phone] Fax: [phone]    Is this the correct pharmacy for this prescription? Yes If no, delete pharmacy and type the correct one.   Has the prescription been filled recently? No  Is the patient out of the medication? Yes  Has the patient been seen for an appointment in the last year OR does the patient have an upcoming appointment? Yes  Can we respond through MyChart? Yes  Agent: Please be advised that Rx refills may take up to 3 business days. We ask that you follow-up with your pharmacy.

## 2024-05-16 ENCOUNTER — Telehealth: Payer: Self-pay

## 2024-05-16 NOTE — Telephone Encounter (Signed)
 Copied from CRM #[id number]. Topic: Appointments - Scheduling Inquiry for Clinic >> May 16, 2024 10:53 AM DeAngela L wrote: Reason for CRM: patient calling about receiving a no call no show letter she had an appointment on 05/11/24 and when she Patient came to the office on 05/08/24 for her follow up appointment and the provider also looked at her acute concern  the office told her they would cancel the 05/11/24 appointment, the appointment must not have been cancelled cause she received a no call no show letter in mail for it and she is really upset now, and would like to have this corrected   Pt num [phone] (M)

## 2024-05-18 ENCOUNTER — Ambulatory Visit (HOSPITAL_COMMUNITY)

## 2024-05-18 ENCOUNTER — Encounter (HOSPITAL_COMMUNITY): Payer: Self-pay

## 2024-05-18 DIAGNOSIS — M5442 Lumbago with sciatica, left side: Secondary | ICD-10-CM | POA: Insufficient documentation

## 2024-05-18 DIAGNOSIS — R262 Difficulty in walking, not elsewhere classified: Secondary | ICD-10-CM | POA: Diagnosis present

## 2024-05-18 DIAGNOSIS — F17210 Nicotine dependence, cigarettes, uncomplicated: Secondary | ICD-10-CM | POA: Insufficient documentation

## 2024-05-18 DIAGNOSIS — M545 Low back pain, unspecified: Secondary | ICD-10-CM | POA: Insufficient documentation

## 2024-05-18 DIAGNOSIS — R29898 Other symptoms and signs involving the musculoskeletal system: Secondary | ICD-10-CM | POA: Diagnosis present

## 2024-05-18 NOTE — Therapy (Signed)
 OUTPATIENT PHYSICAL THERAPY THORACOLUMBAR TREATMENT   Patient Name: Veronica Arnold MRN: [id number] DOB:1970-11-04, 53 y.o., female Today's Date: 05/18/2024  END OF SESSION:  PT End of Session - 05/18/24 0934     Visit Number 2    Number of Visits 8    Date for PT Re-Evaluation 06/01/24    Authorization Type [employer] CARIT[employer] NEXT    Authorization Time Period [employer] approved 8 visits from 05/04/2024-06/01/2024    Authorization - Visit Number 2    Authorization - Number of Visits 8    Progress Note Due on Visit 8    PT Start Time 0935    PT Stop Time 1013    PT Time Calculation (min) 38 min    Activity Tolerance Patient tolerated treatment well    Behavior During Therapy WFL for tasks assessed/performed          Past Medical History:  Diagnosis Date   High cholesterol    Hypertension    Past Surgical History:  Procedure Laterality Date   HERNIA REPAIR     TUBAL LIGATION     TUBAL LIGATION     Patient Active Problem List   Diagnosis Date Noted   Chronic left-sided low back pain with left-sided sciatica 01/24/2024   Greater trochanteric bursitis of left hip 12/28/2023   Bilateral hand pain 10/04/2023   Right foot pain 04/26/2023   Wheezing 01/26/2023   Prediabetes 12/02/2022   Vitamin D  deficiency 12/02/2022   Colon cancer screening 12/02/2022   Breast cancer screening by mammogram 12/02/2022   Need for Tdap vaccination 12/02/2022   Encounter for general adult medical examination with abnormal findings 07/19/2022   Essential hypertension 11/30/2015   Hyperlipidemia 11/30/2015   AP (abdominal pain) 11/30/2015   Cigarette nicotine dependence without complication 11/30/2015    PCP: Bevely Doffing FNP  REFERRING PROVIDER: Brenna Lin, MD  REFERRING DIAG: [phone] (ICD-10-CM) - Left sided sciatica  Rationale for Evaluation and Treatment: Rehabilitation  THERAPY DIAG:  Low back pain with left-sided sciatica, unspecified back pain laterality,  unspecified chronicity  Difficulty in walking, not elsewhere classified  Low back pain, unspecified back pain laterality, unspecified chronicity, unspecified whether sciatica present  Left leg weakness  ONSET DATE: 6 months or so  SUBJECTIVE:                                                                                                                                                                                           SUBJECTIVE STATEMENT: Pt reports about 7/10 pain today in L low back and down LLE. Reports she tried HEP at home. She tried but couldn't lift LLE.  Only did decompression exercise 1.    EVAL: Pain started about 6 months ago.  Insidious onset; couldn't get up on morning.  Pain from left low back and hip all the way down to her left foot; lower leg feels wet.  Saw PCP first and then she sent to [name].  Had x-rays done of hip and back and both showed arthritis.  Gave Prednisone ; did not help; [place] referred to therapy.  Has also had injections and those did not help.    PERTINENT HISTORY:  Bilateral hand pain  PAIN:  Are you having pain? Yes: NPRS scale: 8/10 Pain location: left side back and down left leg Pain description: feels wet Aggravating factors: standing too long, walking too long Relieving factors: sit, rest, meloxicam   PRECAUTIONS: None  WEIGHT BEARING RESTRICTIONS: No  FALLS:  Has patient fallen in last 6 months? No   OCCUPATION: CNA  PLOF: Independent  PATIENT GOALS: make me walk right  NEXT MD VISIT: PRN  OBJECTIVE:  Note: Objective measures were completed at Evaluation unless otherwise noted.  DIAGNOSTIC FINDINGS:  Clinical:  lower back pain with left sided sciatica, no trauma   X-rays were done of the lumbar spine, five views.   There is normal lumbar lordosis present of the lumbar spine.  There are small anterior osteophytes present L3 and L4.  Disc spaces are maintained.  No fracture is noted.  Bone quality is good.    Impression:  mild degenerative changes of the lumbar spine, no acute findings.   Electronically Signed Lemond Brenna, MD 7/16/20259:40 AM  CLINICAL DATA:  Left hip pain.   EXAM: DG HIP (WITH OR WITHOUT PELVIS) 2-3V LEFT   COMPARISON:  None Available.   FINDINGS: Mild bilateral sacroiliac subchondral sclerosis. Minimal bilateral superior femoroacetabular joint space narrowing. Mild bilateral superolateral left greater than right acetabular degenerative osteophytosis.   There are left greater than right superior lateral femoral head-neck junction likely degenerative subchondral cysts, measuring up to 13 mm on the left.   Minimal superior pubic symphysis degenerative spurring.   No acute fracture or dislocation.   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    PATIENT SURVEYS:  Modified Oswestry:  MODIFIED OSWESTRY DISABILITY SCALE  Date: 04/24/24 Score                                Total 32/50; 64%   Interpretation of scores: Score Category Description  0-20% Minimal Disability The patient can cope with most living activities. Usually no treatment is indicated apart from advice on lifting, sitting and exercise  21-40% Moderate Disability The patient experiences more pain and difficulty with sitting, lifting and standing. Travel and social life are more difficult and they may be disabled from work. Personal care, sexual activity and sleeping are not grossly affected, and the patient can usually be managed by conservative means  41-60% Severe Disability Pain remains the main problem in this group, but activities of daily living are affected. These patients require a detailed investigation  61-80% Crippled Back pain impinges on all aspects of the patient's life. Positive intervention is required  81-100% Bed-bound  These patients are either bed-bound or exaggerating their symptoms  [name], et al. Surgery versus  conservative management of stable thoracolumbar fracture: the PRESTO feasibility RCT. [place] ([place]): [employer]; 2021 Nov. [facility] Technology Assessment, No. 25.62.) Appendix 3, Oswestry Disability Index category descriptors. Available  from: [employer]  Minimally Clinically Important Difference (MCID) = 12.8%  COGNITION: Overall cognitive status: Within functional limits for tasks assessed     SENSATION: Decreased sensation left lower leg and foot to light touch  POSTURE: rounded shoulders, forward head, and increased lumbar lordosis  PALPATION: Very tender on palpation to lower lumber spine and left hip and glute  LUMBAR ROM: * pain  AROM eval  Flexion Fingertips to distal pole of patella *  Extension 30% available*  Right lateral flexion Fingertips to 2 above knee joint line  Left lateral flexion 2 above knee joint line*  Right rotation   Left rotation    (Blank rows = not tested)  LOWER EXTREMITY ROM:     Active  Right eval Left eval  Hip flexion    Hip extension    Hip abduction    Hip adduction    Hip internal rotation    Hip external rotation    Knee flexion    Knee extension    Ankle dorsiflexion    Ankle plantarflexion    Ankle inversion    Ankle eversion     (Blank rows = not tested)  LOWER EXTREMITY MMT:    MMT Right eval Left eval  Hip flexion 4+ 3-  Hip extension 4- 2+  Hip abduction    Hip adduction    Hip internal rotation    Hip external rotation    Knee flexion    Knee extension 5 3+  Ankle dorsiflexion 5 4  Ankle plantarflexion    Ankle inversion    Ankle eversion     (Blank rows = not tested)  Hamstring Length RLE: 165 deg LLE: Unable to assess, pt really guarded and reports increased pain even to light touch    FUNCTIONAL TESTS:  5 times sit to stand: 23.91 sec using hands to assist up to standing  GAIT: Distance walked: 50 ft in clinic Assistive device utilized: Single  point cane Level of assistance: Modified independence Comments: slight antalgic gait  TREATMENT DATE:  05/18/24:  Review of goals and HEP Hip extension MMT Hamstring length testing Review of HEP/Decompression 1-5 Supine TA contraction, 5 holds, 10x, verbal cues for palpation to TA to ensure activation  Seated Heel slides, 10x, 5 holds, wash cloth under foot Seated forward flexion with physio-ball, 5 holds,10x, emphasis on posture throughout  5x to R side Seated pelvic tilt, 3 holds, 10x, verbal cues for form and posture throughout   05/04/24 physical therapy evaluation and HEP instruction                                                                                                                                 PATIENT EDUCATION:  Education details: Patient educated on exam findings, POC, scope of PT, HEP, and what to expect next visit. Person educated: Patient Education method: Explanation, Demonstration, and Handouts Education comprehension: verbalized understanding, returned demonstration, verbal cues required, and tactile  cues required   HOME EXERCISE PROGRAM: Decompression exercises 1-5  ASSESSMENT:  CLINICAL IMPRESSION: Patient arrives to session with continued reports of pain in low back and throughout LLE. Began with review of goals. Patient reports understanding and agreeable. Tested hip extension strength. Patient unable to lift LLE against gravity and reports increased pain throughout. Followed with testing hamstring length. Good flexibility in R with little restriction but unable to test LLE, as pt groans in pain with light touch and when attempting to flex knees. Attempts at [facility] to LLE using wash cloth under foot and strap unsuccessful due to pain. Reviewed HEP decompression exercises 1-5. Patient only able to perform Leg lengthener and press with RLE. Added TA contraction in session. Pt tolerated well. Pt demo ability to flex LLE once in seated position with  little discomfort. Ended session with neuromuscular and postural re-education in combination with opening posterior aspect of spine. Pt tolerated well. Patient will benefit from continued skilled physical therapy in order to address current deficits to improve gait, transfers, and pain level.    EVAL: Patient is a 53 y.o. female who was seen today for physical therapy evaluation and treatment for M54.32 (ICD-10-CM) - Left sided sciatica.  Patient demonstrates muscle weakness, reduced ROM, and fascial restrictions which are likely contributing to symptoms of pain and are negatively impacting patient ability to perform ADLs and functional mobility tasks. Patient will benefit from skilled physical therapy services to address these deficits to reduce pain and improve level of function with ADLs and functional mobility tasks.   OBJECTIVE IMPAIRMENTS: Abnormal gait, decreased activity tolerance, decreased mobility, difficulty walking, decreased ROM, decreased strength, impaired perceived functional ability, and pain.   ACTIVITY LIMITATIONS: carrying, lifting, bending, standing, squatting, sleeping, and locomotion level  PARTICIPATION LIMITATIONS: meal prep, cleaning, laundry, shopping, community activity, and occupation   REHAB POTENTIAL: Good  CLINICAL DECISION MAKING: Evolving/moderate complexity  EVALUATION COMPLEXITY: Moderate   GOALS: Goals reviewed with patient? Yes  SHORT TERM GOALS: Target date: 05/18/2024  patient will be independent with initial HEP  Baseline: Goal status: INITIAL  2.  Patient will report 30% improvement overall  Baseline:  Goal status: INITIAL  LONG TERM GOALS: Target date: 06/01/2024  Patient will be independent in self management strategies to improve quality of life and functional outcomes.  Baseline:  Goal status: INITIAL  2.  Patient will report 50% improvement overall  Baseline:  Goal status: INITIAL  3.  Patient will improve Modified  Oswestry score by 12 points (20/50 or less) to demonstrate improved perceived function  Baseline: 32/50 Goal status: INITIAL  4.  Patient will improve 5 times sit to stand score to 15 sec or less to demonstrate improved functional mobility and increased leg strength.     Baseline: 23.91 sec Goal status: INITIAL  5.   Patient will increase bilateral leg MMT's to 4+ to 5/5 to allow navigation of steps without gait deviation or loss of balance  Baseline: see above Goal status: INITIAL   PLAN:  PT FREQUENCY: 2x/week  PT DURATION: 4 weeks  PLANNED INTERVENTIONS: 97164- PT Re-evaluation, 97110-Therapeutic exercises, 97530- Therapeutic activity, 97112- Neuromuscular re-education, 97535- Self Care, [id number]- Manual therapy, [id number]- Gait training, [phone]- Orthotic Fit/training, [phone]- Canalith repositioning, [id number]- Aquatic Therapy, 97760- Splinting, [id number]- Wound care (first 20 sq cm), 97598- Wound care (each additional 20 sq cm)Patient/Family education, Balance training, Stair training, Taping, Dry Needling, Joint mobilization, Joint manipulation, Spinal manipulation, Spinal mobilization, Scar mobilization, and DME instructions. SABRA  PLAN FOR NEXT SESSION: Progress  decompression exercises; pelvic tilt; core and postural strengthening   10:18 AM, 05/18/24 Rosaria Settler, PT, DPT [facility] [place]

## 2024-05-21 ENCOUNTER — Ambulatory Visit (HOSPITAL_COMMUNITY)

## 2024-05-21 ENCOUNTER — Encounter (HOSPITAL_COMMUNITY): Payer: Self-pay

## 2024-05-21 DIAGNOSIS — R29898 Other symptoms and signs involving the musculoskeletal system: Secondary | ICD-10-CM

## 2024-05-21 DIAGNOSIS — M5442 Lumbago with sciatica, left side: Secondary | ICD-10-CM | POA: Diagnosis not present

## 2024-05-21 DIAGNOSIS — R262 Difficulty in walking, not elsewhere classified: Secondary | ICD-10-CM

## 2024-05-21 DIAGNOSIS — M545 Low back pain, unspecified: Secondary | ICD-10-CM

## 2024-05-21 NOTE — Therapy (Signed)
 OUTPATIENT PHYSICAL THERAPY THORACOLUMBAR TREATMENT   Patient Name: Veronica Arnold MRN: [id number] DOB:1971/02/15, 53 y.o., female Today's Date: 05/21/2024  END OF SESSION:  PT End of Session - 05/21/24 1016     Visit Number 3    Number of Visits 8    Date for PT Re-Evaluation 06/01/24    Authorization Type [employer] CARIT[employer] NEXT    Authorization Time Period [employer] approved 8 visits from 05/04/2024-06/01/2024    Authorization - Visit Number 3    Authorization - Number of Visits 8    Progress Note Due on Visit 8    PT Start Time 0933    PT Stop Time 1015    PT Time Calculation (min) 42 min    Activity Tolerance Patient tolerated treatment well    Behavior During Therapy [facility] for tasks assessed/performed           Past Medical History:  Diagnosis Date   High cholesterol    Hypertension    Past Surgical History:  Procedure Laterality Date   HERNIA REPAIR     TUBAL LIGATION     TUBAL LIGATION     Patient Active Problem List   Diagnosis Date Noted   Chronic left-sided low back pain with left-sided sciatica 01/24/2024   Greater trochanteric bursitis of left hip 12/28/2023   Bilateral hand pain 10/04/2023   Right foot pain 04/26/2023   Wheezing 01/26/2023   Prediabetes 12/02/2022   Vitamin D  deficiency 12/02/2022   Colon cancer screening 12/02/2022   Breast cancer screening by mammogram 12/02/2022   Need for Tdap vaccination 12/02/2022   Encounter for general adult medical examination with abnormal findings 07/19/2022   Essential hypertension 11/30/2015   Hyperlipidemia 11/30/2015   AP (abdominal pain) 11/30/2015   Cigarette nicotine dependence without complication 11/30/2015    PCP: Veronica Doffing FNP  REFERRING PROVIDER: Brenna Lin, MD  REFERRING DIAG: [phone] (ICD-10-CM) - Left sided sciatica  Rationale for Evaluation and Treatment: Rehabilitation  THERAPY DIAG:  Low back pain with left-sided sciatica, unspecified back pain laterality,  unspecified chronicity  Difficulty in walking, not elsewhere classified  Low back pain, unspecified back pain laterality, unspecified chronicity, unspecified whether sciatica present  Left leg weakness  ONSET DATE: 6 months or so  SUBJECTIVE:                                                                                                                                                                                           SUBJECTIVE STATEMENT: Reports of pain Lt side of lower back with radicular symptoms back Lt LE, pain scale 8/10.  Able to stand and walk for  5 minutes total.   EVAL: Pain started about 6 months ago.  Insidious onset; couldn't get up on morning.  Pain from left low back and hip all the way down to her left foot; lower leg feels wet.  Saw PCP first and then she sent to [name].  Had x-rays done of hip and back and both showed arthritis.  Gave Prednisone ; did not help; [place] referred to therapy.  Has also had injections and those did not help.    PERTINENT HISTORY:  Bilateral hand pain  PAIN:  Are you having pain? Yes: NPRS scale: 8/10 Pain location: left side back and down left leg Pain description: feels wet Aggravating factors: standing too long, walking too long Relieving factors: sit, rest, meloxicam   PRECAUTIONS: None  WEIGHT BEARING RESTRICTIONS: No  FALLS:  Has patient fallen in last 6 months? No   OCCUPATION: CNA  PLOF: Independent  PATIENT GOALS: make me walk right  NEXT MD VISIT: PRN  OBJECTIVE:  Note: Objective measures were completed at Evaluation unless otherwise noted.  DIAGNOSTIC FINDINGS:  Clinical:  lower back pain with left sided sciatica, no trauma   X-rays were done of the lumbar spine, five views.   There is normal lumbar lordosis present of the lumbar spine.  There are small anterior osteophytes present L3 and L4.  Disc spaces are maintained.  No fracture is noted.  Bone quality is good.   Impression:  mild  degenerative changes of the lumbar spine, no acute findings.   Electronically Signed Veronica Brenna, MD 7/16/20259:40 AM  CLINICAL DATA:  Left hip pain.   EXAM: DG HIP (WITH OR WITHOUT PELVIS) 2-3V LEFT   COMPARISON:  None Available.   FINDINGS: Mild bilateral sacroiliac subchondral sclerosis. Minimal bilateral superior femoroacetabular joint space narrowing. Mild bilateral superolateral left greater than right acetabular degenerative osteophytosis.   There are left greater than right superior lateral femoral head-neck junction likely degenerative subchondral cysts, measuring up to 13 mm on the left.   Minimal superior pubic symphysis degenerative spurring.   No acute fracture or dislocation.   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    PATIENT SURVEYS:  Modified Oswestry:  MODIFIED OSWESTRY DISABILITY SCALE  Date: 04/24/24 Score                                Total 32/50; 64%   Interpretation of scores: Score Category Description  0-20% Minimal Disability The patient can cope with most living activities. Usually no treatment is indicated apart from advice on lifting, sitting and exercise  21-40% Moderate Disability The patient experiences more pain and difficulty with sitting, lifting and standing. Travel and social life are more difficult and they may be disabled from work. Personal care, sexual activity and sleeping are not grossly affected, and the patient can usually be managed by conservative means  41-60% Severe Disability Pain remains the main problem in this group, but activities of daily living are affected. These patients require a detailed investigation  61-80% Crippled Back pain impinges on all aspects of the patient's life. Positive intervention is required  81-100% Bed-bound  These patients are either bed-bound or exaggerating their symptoms  Veronica Arnold Veronica Arnold, et al. Surgery versus conservative  management of stable thoracolumbar fracture: the PRESTO feasibility RCT. [place] ([place]): [employer]; 2021 Nov. [facility] Technology Assessment, No. 25.62.) Appendix 3, Oswestry Disability Index category descriptors. Available from: [employer]cz  Minimally Clinically Important Difference (MCID) = 12.8%  COGNITION: Overall cognitive status: Within functional limits for tasks assessed     SENSATION: Decreased sensation left lower leg and foot to light touch  POSTURE: rounded shoulders, forward head, and increased lumbar lordosis  PALPATION: Very tender on palpation to lower lumber spine and left hip and glute  LUMBAR ROM: * pain  AROM eval  Flexion Fingertips to distal pole of patella *  Extension 30% available*  Right lateral flexion Fingertips to 2 above knee joint line  Left lateral flexion 2 above knee joint line*  Right rotation   Left rotation    (Blank rows = not tested)  LOWER EXTREMITY ROM:     Active  Right eval Left eval  Hip flexion    Hip extension    Hip abduction    Hip adduction    Hip internal rotation    Hip external rotation    Knee flexion    Knee extension    Ankle dorsiflexion    Ankle plantarflexion    Ankle inversion    Ankle eversion     (Blank rows = not tested)  LOWER EXTREMITY MMT:    MMT Right eval Left eval  Hip flexion 4+ 3-  Hip extension 4- 2+  Hip abduction    Hip adduction    Hip internal rotation    Hip external rotation    Knee flexion    Knee extension 5 3+  Ankle dorsiflexion 5 4  Ankle plantarflexion    Ankle inversion    Ankle eversion     (Blank rows = not tested)  Hamstring Length RLE: 165 deg LLE: Unable to assess, pt really guarded and reports increased pain even to light touch    FUNCTIONAL TESTS:  5 times sit to stand: 23.91 sec using hands to assist up to standing  GAIT: Distance walked: 50 ft in clinic Assistive device utilized: Single point cane Level  of assistance: Modified independence Comments: slight antalgic gait  TREATMENT DATE:  05/21/24: Supine:  - Posterior pelvic tilt with cueing for core activation  - Deep breathing with 1 hand on stomach/1 on chest  - TrA activation x with cueing to assure correct mm activated paired with exhale to reduce holding breathing Seated  -Pelvic rotation  - Increased pain with palpation over Lt PSIS Assessed LLD and rotation, equal, no need for MET Supine:  -pubic clearance (Isometric adduction with ball, abduction with belt) 10x 5  - SKTC 2x 30 (needs towel for Lt LE) -increased pain Seated:  - abdominal sets  - seated posture  - scapular retraction 10x 5  05/18/24:  Review of goals and HEP Hip extension MMT Hamstring length testing Review of HEP/Decompression 1-5 Supine TA contraction, 5 holds, 10x, verbal cues for palpation to TA to ensure activation  Seated Heel slides, 10x, 5 holds, wash cloth under foot Seated forward flexion with physio-ball, 5 holds,10x, emphasis on posture throughout  5x to R side Seated pelvic tilt, 3 holds, 10x, verbal cues for form and posture throughout   05/04/24 physical therapy evaluation and HEP instruction  PATIENT EDUCATION:  Education details: Patient educated on exam findings, POC, scope of PT, HEP, and what to expect next visit. Person educated: Patient Education method: Explanation, Demonstration, and Handouts Education comprehension: verbalized understanding, returned demonstration, verbal cues required, and tactile cues required   HOME EXERCISE PROGRAM: Decompression exercises 1-5  Access Code: [email or web address] URL: [email or web address] Date: 05/21/2024 Prepared by: Augustin Mclean  Exercises - Hooklying Isometric Hip Abduction Adduction with Belt and Ball  - 2 x daily - 7 x weekly - 1 sets  - 10 reps - 5 hold - Supine Transversus Abdominis Bracing - Hands on Stomach  - 2 x daily - 7 x weekly - 2 sets - 10 reps - 5 hold - Correct Seated Posture  - 5 x daily - 7 x weekly - 1 sets - 1 reps - Seated Scapular Retraction  - 2 x daily - 7 x weekly - 2 sets - 10 reps - 5 hold ASSESSMENT:  CLINICAL IMPRESSION: NMR to improve TrA activation and reduce compensation with holding breath and utilizing wrong mm.  Improvements with contraction when paired with breathing and excertion with exhalation.  Pt with pain over PSIS with palpation, checkedSI alignment with no LLD and equal rotation.  Added pubic clearance exercises with reports of some improvements.  Educated on importance of posture to assist with pain control.  Added these exercises and postural awareness to HEP with handout given and verbalized understanding.  Pt continues to be limited by pain in lower back and Lt LE radicular symptoms and significant weakness Lt LE compared to Rt.  Pt will continue to benefit from skilled intervention to address weakness, mobility and pain.     EVAL: Patient is a 53 y.o. female who was seen today for physical therapy evaluation and treatment for M54.32 (ICD-10-CM) - Left sided sciatica.  Patient demonstrates muscle weakness, reduced ROM, and fascial restrictions which are likely contributing to symptoms of pain and are negatively impacting patient ability to perform ADLs and functional mobility tasks. Patient will benefit from skilled physical therapy services to address these deficits to reduce pain and improve level of function with ADLs and functional mobility tasks.   OBJECTIVE IMPAIRMENTS: Abnormal gait, decreased activity tolerance, decreased mobility, difficulty walking, decreased ROM, decreased strength, impaired perceived functional ability, and pain.   ACTIVITY LIMITATIONS: carrying, lifting, bending, standing, squatting, sleeping, and locomotion level  PARTICIPATION LIMITATIONS: meal prep,  cleaning, laundry, shopping, community activity, and occupation   REHAB POTENTIAL: Good  CLINICAL DECISION MAKING: Evolving/moderate complexity  EVALUATION COMPLEXITY: Moderate   GOALS: Goals reviewed with patient? Yes  SHORT TERM GOALS: Target date: 05/18/2024  patient will be independent with initial HEP  Baseline: Goal status: INITIAL  2.  Patient will report 30% improvement overall  Baseline:  Goal status: INITIAL  LONG TERM GOALS: Target date: 06/01/2024  Patient will be independent in self management strategies to improve quality of life and functional outcomes.  Baseline:  Goal status: INITIAL  2.  Patient will report 50% improvement overall  Baseline:  Goal status: INITIAL  3.  Patient will improve Modified Oswestry score by 12 points (20/50 or less) to demonstrate improved perceived function  Baseline: 32/50 Goal status: INITIAL  4.  Patient will improve 5 times sit to stand score to 15 sec or less to demonstrate improved functional mobility and increased leg strength.     Baseline: 23.91 sec Goal status: INITIAL  5.   Patient will increase bilateral leg MMT's to 4+ to 5/5 to allow navigation  of steps without gait deviation or loss of balance  Baseline: see above Goal status: INITIAL   PLAN:  PT FREQUENCY: 2x/week  PT DURATION: 4 weeks  PLANNED INTERVENTIONS: 97164- PT Re-evaluation, 97110-Therapeutic exercises, 97530- Therapeutic activity, 97112- Neuromuscular re-education, 97535- Self Care, [id number]- Manual therapy, [id number]- Gait training, [phone]- Orthotic Fit/training, [phone]- Canalith repositioning, [id number]- Aquatic Therapy, [phone]- Splinting, [phone]- Wound care (first 20 sq cm), 97598- Wound care (each additional 20 sq cm)Patient/Family education, Balance training, Stair training, Taping, Dry Needling, Joint mobilization, Joint manipulation, Spinal manipulation, Spinal mobilization, Scar mobilization, and DME instructions. SABRA  PLAN FOR NEXT SESSION:  Progress decompression exercises; pelvic tilt; core and postural strengthening  Augustin Mclean, LPTA/CLT; CBIS [phone]  12:12 PM, 05/21/24

## 2024-05-23 ENCOUNTER — Ambulatory Visit (HOSPITAL_COMMUNITY): Admitting: Physical Therapy

## 2024-05-23 DIAGNOSIS — M545 Low back pain, unspecified: Secondary | ICD-10-CM

## 2024-05-23 DIAGNOSIS — M5442 Lumbago with sciatica, left side: Secondary | ICD-10-CM

## 2024-05-23 DIAGNOSIS — R262 Difficulty in walking, not elsewhere classified: Secondary | ICD-10-CM

## 2024-05-23 DIAGNOSIS — R29898 Other symptoms and signs involving the musculoskeletal system: Secondary | ICD-10-CM

## 2024-05-23 NOTE — Therapy (Signed)
 OUTPATIENT PHYSICAL THERAPY THORACOLUMBAR TREATMENT   Patient Name: Veronica Arnold MRN: [id number] DOB:[birth year], 53 y.o., female Today's Date: 05/23/2024  END OF SESSION:  PT End of Session - 05/23/24 1154     Visit Number 4    Number of Visits 8    Date for PT Re-Evaluation 06/01/24    Authorization Type [employer] CARITAS NEXT    Authorization Time Period [employer] approved 8 visits from 05/04/2024-06/01/2024    Authorization - Visit Number 4    Authorization - Number of Visits 8    Progress Note Due on Visit 8    PT Start Time 1152    PT Stop Time 1230    PT Time Calculation (min) 38 min    Activity Tolerance Patient tolerated treatment well    Behavior During Therapy WFL for tasks assessed/performed           Past Medical History:  Diagnosis Date   High cholesterol    Hypertension    Past Surgical History:  Procedure Laterality Date   HERNIA REPAIR     TUBAL LIGATION     TUBAL LIGATION     Patient Active Problem List   Diagnosis Date Noted   Chronic left-sided low back pain with left-sided sciatica 01/24/2024   Greater trochanteric bursitis of left hip 12/28/2023   Bilateral hand pain 10/04/2023   Right foot pain 04/26/2023   Wheezing 01/26/2023   Prediabetes 12/02/2022   Vitamin D  deficiency 12/02/2022   Colon cancer screening 12/02/2022   Breast cancer screening by mammogram 12/02/2022   Need for Tdap vaccination 12/02/2022   Encounter for general adult medical examination with abnormal findings 07/19/2022   Essential hypertension 11/30/2015   Hyperlipidemia 11/30/2015   AP (abdominal pain) 11/30/2015   Cigarette nicotine dependence without complication 11/30/2015    PCP: Bevely Doffing FNP  REFERRING PROVIDER: Brenna Lin, MD  REFERRING DIAG: [phone] (ICD-10-CM) - Left sided sciatica  Rationale for Evaluation and Treatment: Rehabilitation  THERAPY DIAG:  Low back pain with left-sided sciatica, unspecified back pain laterality,  unspecified chronicity  Difficulty in walking, not elsewhere classified  Low back pain, unspecified back pain laterality, unspecified chronicity, unspecified whether sciatica present  Left leg weakness  ONSET DATE: 6 months or so  SUBJECTIVE:                                                                                                                                                                                           SUBJECTIVE STATEMENT: Reports of pain Lt side of lower back with radicular symptoms back Lt LE, pain scale 7/10.  Reports compliance with HEP.  EVAL:  Pain started about 6 months ago.  Insidious onset; couldn't get up on morning.  Pain from left low back and hip all the way down to her left foot; lower leg feels wet.  Saw PCP first and then she sent to [name].  Had x-rays done of hip and back and both showed arthritis.  Gave Prednisone ; did not help; [place] referred to therapy.  Has also had injections and those did not help.    PERTINENT HISTORY:  Bilateral hand pain  PAIN:  Are you having pain? Yes: NPRS scale: 8/10 Pain location: left side back and down left leg Pain description: feels wet Aggravating factors: standing too long, walking too long Relieving factors: sit, rest, meloxicam   PRECAUTIONS: None  WEIGHT BEARING RESTRICTIONS: No  FALLS:  Has patient fallen in last 6 months? No   OCCUPATION: CNA  PLOF: Independent  PATIENT GOALS: make me walk right  NEXT MD VISIT: PRN  OBJECTIVE:  Note: Objective measures were completed at Evaluation unless otherwise noted.  DIAGNOSTIC FINDINGS:  Clinical:  lower back pain with left sided sciatica, no trauma   X-rays were done of the lumbar spine, five views.   There is normal lumbar lordosis present of the lumbar spine.  There are small anterior osteophytes present L3 and L4.  Disc spaces are maintained.  No fracture is noted.  Bone quality is good.   Impression:  mild degenerative changes  of the lumbar spine, no acute findings.   Electronically Signed Lemond Brenna, MD 7/16/20259:40 AM  CLINICAL DATA:  Left hip pain.   EXAM: DG HIP (WITH OR WITHOUT PELVIS) 2-3V LEFT   COMPARISON:  None Available.   FINDINGS: Mild bilateral sacroiliac subchondral sclerosis. Minimal bilateral superior femoroacetabular joint space narrowing. Mild bilateral superolateral left greater than right acetabular degenerative osteophytosis.   There are left greater than right superior lateral femoral head-neck junction likely degenerative subchondral cysts, measuring up to 13 mm on the left.   Minimal superior pubic symphysis degenerative spurring.   No acute fracture or dislocation.   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    PATIENT SURVEYS:  Modified Oswestry:  MODIFIED OSWESTRY DISABILITY SCALE  Date: 04/24/24 Score  Total 32/50; 64%   Interpretation of scores: Score Category Description  0-20% Minimal Disability The patient can cope with most living activities. Usually no treatment is indicated apart from advice on lifting, sitting and exercise  21-40% Moderate Disability The patient experiences more pain and difficulty with sitting, lifting and standing. Travel and social life are more difficult and they may be disabled from work. Personal care, sexual activity and sleeping are not grossly affected, and the patient can usually be managed by conservative means  41-60% Severe Disability Pain remains the main problem in this group, but activities of daily living are affected. These patients require a detailed investigation  61-80% Crippled Back pain impinges on all aspects of the patient's life. Positive intervention is required  81-100% Bed-bound  These patients are either bed-bound or exaggerating their symptoms  [name], et al. Surgery versus conservative management of stable thoracolumbar fracture: the PRESTO feasibility  RCT. [place] ([place]): [employer]; 2021 Nov. [facility] Technology Assessment, No. 25.62.) Appendix 3, Oswestry Disability Index category descriptors. Available from: FindJewelers.cz  Minimally Clinically Important Difference (MCID) = 12.8%  COGNITION: Overall cognitive status: Within functional limits for tasks assessed     SENSATION: Decreased sensation left lower leg and foot to light touch  POSTURE: rounded  shoulders, forward head, and increased lumbar lordosis  PALPATION: Very tender on palpation to lower lumber spine and left hip and glute  LUMBAR ROM: * pain  AROM eval  Flexion Fingertips to distal pole of patella *  Extension 30% available*  Right lateral flexion Fingertips to 2 above knee joint line  Left lateral flexion 2 above knee joint line*  Right rotation   Left rotation    (Blank rows = not tested)  LOWER EXTREMITY ROM:     Active  Right eval Left eval  Hip flexion    Hip extension    Hip abduction    Hip adduction    Hip internal rotation    Hip external rotation    Knee flexion    Knee extension    Ankle dorsiflexion    Ankle plantarflexion    Ankle inversion    Ankle eversion     (Blank rows = not tested)  LOWER EXTREMITY MMT:    MMT Right eval Left eval  Hip flexion 4+ 3-  Hip extension 4- 2+  Hip abduction    Hip adduction    Hip internal rotation    Hip external rotation    Knee flexion    Knee extension 5 3+  Ankle dorsiflexion 5 4  Ankle plantarflexion    Ankle inversion    Ankle eversion     (Blank rows = not tested)  Hamstring Length RLE: 165 deg LLE: Unable to assess, pt really guarded and reports increased pain even to light touch    FUNCTIONAL TESTS:  5 times sit to stand: 23.91 sec using hands to assist up to standing  GAIT: Distance walked: 50 ft in clinic Assistive device utilized: Single point cane Level of assistance: Modified independence Comments: slight antalgic  gait  TREATMENT DATE:  05/23/24 Seated:  sit to stands no UE 5X  Piriformis stretch 30 each side Standing:  red TB rows 10X  Red TB extension 10X  Supine:  decompression 2-5, 10X each  Bridge 10X 3 holds  Abdominal isometrics, TrA 10X5  Lower trunk rotations 10X 5  Piriformis stretch shown in supine for alternative way (seated better)  05/21/24: Supine:  - Posterior pelvic tilt with cueing for core activation  - Deep breathing with 1 hand on stomach/1 on chest  - TrA activation x with cueing to assure correct mm activated paired with exhale to reduce holding breathing Seated  -Pelvic rotation  - Increased pain with palpation over Lt PSIS Assessed LLD and rotation, equal, no need for MET Supine:  -pubic clearance (Isometric adduction with ball, abduction with belt) 10x 5  - SKTC 2x 30 (needs towel for Lt LE) -increased pain Seated:  - abdominal sets  - seated posture  - scapular retraction 10x 5  05/18/24:  Review of goals and HEP Hip extension MMT Hamstring length testing Review of HEP/Decompression 1-5 Supine TA contraction, 5 holds, 10x, verbal cues for palpation to TA to ensure activation  Seated Heel slides, 10x, 5 holds, wash cloth under foot Seated forward flexion with physio-ball, 5 holds,10x, emphasis on posture throughout  5x to R side Seated pelvic tilt, 3 holds, 10x, verbal cues for form and posture throughout   05/04/24 physical therapy evaluation and HEP instruction  PATIENT EDUCATION:  Education details: Patient educated on exam findings, POC, scope of PT, HEP, and what to expect next visit. Person educated: Patient Education method: Explanation, Demonstration, and Handouts Education comprehension: verbalized understanding, returned demonstration, verbal cues required, and tactile cues required   HOME EXERCISE  PROGRAM: Decompression exercises 1-5  Access Code: [email or web address] URL: [email or web address] Date: 05/21/2024 Prepared by: Augustin Mclean Exercises - Hooklying Isometric Hip Abduction Adduction with Belt and Ball  - 2 x daily - 7 x weekly - 1 sets - 10 reps - 5 hold - Supine Transversus Abdominis Bracing - Hands on Stomach  - 2 x daily - 7 x weekly - 2 sets - 10 reps - 5 hold - Correct Seated Posture  - 5 x daily - 7 x weekly - 1 sets - 1 reps - Seated Scapular Retraction  - 2 x daily - 7 x weekly - 2 sets - 10 reps - 5 hold   Access Code: [email or web address] URL: [email or web address] Date: 05/23/2024 Prepared by: Greig Fuse - Seated Piriformis Stretch with Trunk Bend  - 2 x daily - 7 x weekly - 2 sets - 3 reps - 30 sec hold - Sit to Stand  - 2 x daily - 7 x weekly - 2 sets - 10 reps - Standing Bilateral Low Shoulder Row with Anchored Resistance  - 2 x daily - 7 x weekly - 2 sets - 10 reps - Shoulder Extension with Resistance  - 2 x daily - 7 x weekly - 2 sets - 10 reps  ASSESSMENT:  CLINICAL IMPRESSION: Began with seated piriformis stretch with noted tightness bilaterally but more pronounced in the Lt. Pt also unable to place Lt leg on top of Rt without max assist from UE's due to weakness.   Pt with weakness completing sit to stands with cues for glut recruitment.  Only able to complete 5 repetitions due to pain/fatigue.  Began postural strengthening using tband with overall good form, cues to increase hold times. Pt does not use logroll technique nor exhibits difficulty or pain behaviors while completing sit to/from supine transfers.  Added these exercises to HEP today.  No change in pain level at end of session today. Pt will continue to benefit from skilled intervention to address weakness, mobility and pain.     EVAL: Patient is a 53 y.o. female who was seen today for physical therapy evaluation and treatment for M54.32 (ICD-10-CM) - Left sided sciatica.  Patient  demonstrates muscle weakness, reduced ROM, and fascial restrictions which are likely contributing to symptoms of pain and are negatively impacting patient ability to perform ADLs and functional mobility tasks. Patient will benefit from skilled physical therapy services to address these deficits to reduce pain and improve level of function with ADLs and functional mobility tasks.   OBJECTIVE IMPAIRMENTS: Abnormal gait, decreased activity tolerance, decreased mobility, difficulty walking, decreased ROM, decreased strength, impaired perceived functional ability, and pain.   ACTIVITY LIMITATIONS: carrying, lifting, bending, standing, squatting, sleeping, and locomotion level  PARTICIPATION LIMITATIONS: meal prep, cleaning, laundry, shopping, community activity, and occupation   REHAB POTENTIAL: Good  CLINICAL DECISION MAKING: Evolving/moderate complexity  EVALUATION COMPLEXITY: Moderate   GOALS: Goals reviewed with patient? Yes  SHORT TERM GOALS: Target date: 05/18/2024  patient will be independent with initial HEP  Baseline: Goal status: INITIAL  2.  Patient will report 30% improvement overall  Baseline:  Goal status: INITIAL  LONG TERM GOALS: Target date: 06/01/2024  Patient will be independent in self management strategies to  improve quality of life and functional outcomes.  Baseline:  Goal status: INITIAL  2.  Patient will report 50% improvement overall  Baseline:  Goal status: INITIAL  3.  Patient will improve Modified Oswestry score by 12 points (20/50 or less) to demonstrate improved perceived function  Baseline: 32/50 Goal status: INITIAL  4.  Patient will improve 5 times sit to stand score to 15 sec or less to demonstrate improved functional mobility and increased leg strength.     Baseline: 23.91 sec Goal status: INITIAL  5.   Patient will increase bilateral leg MMT's to 4+ to 5/5 to allow navigation of steps without gait deviation or loss of  balance  Baseline: see above Goal status: INITIAL   PLAN:  PT FREQUENCY: 2x/week  PT DURATION: 4 weeks  PLANNED INTERVENTIONS: 97164- PT Re-evaluation, 97110-Therapeutic exercises, 97530- Therapeutic activity, 97112- Neuromuscular re-education, 97535- Self Care, 02859- Manual therapy, [id number]- Gait training, [phone]- Orthotic Fit/training, [phone]- Canalith repositioning, [id number]- Aquatic Therapy, 97760- Splinting, [id number]- Wound care (first 20 sq cm), 97598- Wound care (each additional 20 sq cm)Patient/Family education, Balance training, Stair training, Taping, Dry Needling, Joint mobilization, Joint manipulation, Spinal manipulation, Spinal mobilization, Scar mobilization, and DME instructions. SABRA  PLAN FOR NEXT SESSION: Progress decompression exercises; pelvic tilt; core and postural strengthening  [name], PTA/CLT [facility] Ph: [phone]  11:55 AM, 05/23/24

## 2024-05-24 ENCOUNTER — Telehealth: Payer: Self-pay

## 2024-05-24 ENCOUNTER — Telehealth: Payer: Self-pay | Admitting: Orthopaedic Surgery

## 2024-05-24 ENCOUNTER — Other Ambulatory Visit: Payer: Self-pay | Admitting: Orthopaedic Surgery

## 2024-05-24 DIAGNOSIS — G8929 Other chronic pain: Secondary | ICD-10-CM

## 2024-05-24 NOTE — Telephone Encounter (Signed)
 Tried to return the pt's call, mb not setup

## 2024-05-24 NOTE — Telephone Encounter (Signed)
 Dr. Janae Veronica Arnold - Veronica Arnold is requesting something for pain to be sent to [facility] in [place]

## 2024-05-24 NOTE — Telephone Encounter (Signed)
 Copied from CRM [id number]. Topic: Clinical - Medication Question >> May 24, 2024 10:10 AM Travis F wrote: Reason for CRM: Patient is calling in because she was prescribed HYDROcodone -acetaminophen  (NORCO) 10-325 MG tablet [[id number]] 30 tabs and she was only able to get 21 due to insurance. Patient wants to know if she can get the other 9 pills called in to [facility] in [place].

## 2024-05-25 ENCOUNTER — Telehealth: Payer: Self-pay

## 2024-05-25 ENCOUNTER — Other Ambulatory Visit: Payer: Self-pay

## 2024-05-25 DIAGNOSIS — G8929 Other chronic pain: Secondary | ICD-10-CM

## 2024-05-25 MED ORDER — HYDROCODONE-ACETAMINOPHEN 10-325 MG PO TABS: 1.0000 | ORAL_TABLET | Freq: Four times a day (QID) | ORAL | 0 refills | Status: AC | PRN

## 2024-05-25 NOTE — Telephone Encounter (Signed)
 Patient called. Checking on previous message.

## 2024-05-25 NOTE — Telephone Encounter (Signed)
 Copied from CRM [id number]. Topic: Clinical - Prescription Issue >> May 25, 2024  2:30 PM Carlatta H wrote: Reason for CRM: The patient insurance would only approve fill for  21 pills for her HYDROcodone -acetaminophen  (NORCO) 10-325 MG tablet [[id number]] prescription//she would like a prescription called in for the other 9// >> May 25, 2024  3:24 PM Zebedee SAUNDERS wrote: Pt stated last prescription was missing 9 pills. She does not need a new prescription just the remainder from the previous 9 that were missing. Please call  [facility] [address], [place] - [address]  [address] [place] [address]  Phone: [phone] Fax: [phone]

## 2024-05-25 NOTE — Telephone Encounter (Signed)
 Phone note from pt PCP states she is calling to get refill from them, they last prescribes the meds on 09/09 then she called here to ask for a refill as well. Dr. MARLA will not prescribe this he has never rx pain meds to this pt.

## 2024-05-25 NOTE — Telephone Encounter (Signed)
 Copied from CRM [id number]. Topic: Clinical - Prescription Issue >> May 25, 2024  2:30 PM Carlatta H wrote: Reason for CRM: The patient insurance would only approve fill for  21 pills for her HYDROcodone -acetaminophen  (NORCO) 10-325 MG tablet [[id number]] prescription//she would like a prescription called in for the other 9//

## 2024-05-25 NOTE — Telephone Encounter (Signed)
 9 tablets were sent

## 2024-05-28 ENCOUNTER — Encounter (HOSPITAL_COMMUNITY): Admitting: Physical Therapy

## 2024-05-28 NOTE — Telephone Encounter (Signed)
 9 tablets were sent in 09/19

## 2024-05-29 ENCOUNTER — Ambulatory Visit (HOSPITAL_COMMUNITY)

## 2024-05-29 ENCOUNTER — Encounter (HOSPITAL_COMMUNITY): Payer: Self-pay

## 2024-05-29 DIAGNOSIS — M5442 Lumbago with sciatica, left side: Secondary | ICD-10-CM

## 2024-05-29 DIAGNOSIS — R262 Difficulty in walking, not elsewhere classified: Secondary | ICD-10-CM

## 2024-05-29 DIAGNOSIS — M545 Low back pain, unspecified: Secondary | ICD-10-CM

## 2024-05-29 DIAGNOSIS — R29898 Other symptoms and signs involving the musculoskeletal system: Secondary | ICD-10-CM

## 2024-05-29 NOTE — Therapy (Signed)
 OUTPATIENT PHYSICAL THERAPY THORACOLUMBAR TREATMENT   Patient Name: Veronica Arnold MRN: [id number] DOB:[birth year], 53 y.o., female Today's Date: 05/29/2024  END OF SESSION:  PT End of Session - 05/29/24 0903     Visit Number 5    Number of Visits 8    Date for Recertification  06/01/24    Authorization Type [employer] CARITAS NEXT    Authorization Time Period [employer] approved 8 visits from 05/04/2024-06/01/2024    Authorization - Visit Number 5    Authorization - Number of Visits 8    Progress Note Due on Visit 8    PT Start Time 0905    PT Stop Time 0943    PT Time Calculation (min) 38 min    Activity Tolerance Patient tolerated treatment well    Behavior During Therapy [facility] for tasks assessed/performed           Past Medical History:  Diagnosis Date   High cholesterol    Hypertension    Past Surgical History:  Procedure Laterality Date   HERNIA REPAIR     TUBAL LIGATION     TUBAL LIGATION     Patient Active Problem List   Diagnosis Date Noted   Chronic left-sided low back pain with left-sided sciatica 01/24/2024   Greater trochanteric bursitis of left hip 12/28/2023   Bilateral hand pain 10/04/2023   Right foot pain 04/26/2023   Wheezing 01/26/2023   Prediabetes 12/02/2022   Vitamin D  deficiency 12/02/2022   Colon cancer screening 12/02/2022   Breast cancer screening by mammogram 12/02/2022   Need for Tdap vaccination 12/02/2022   Encounter for general adult medical examination with abnormal findings 07/19/2022   Essential hypertension 11/30/2015   Hyperlipidemia 11/30/2015   AP (abdominal pain) 11/30/2015   Cigarette nicotine dependence without complication 11/30/2015    PCP: Bevely Doffing FNP  REFERRING PROVIDER: Brenna Lin, MD  REFERRING DIAG: [phone] (ICD-10-CM) - Left sided sciatica  Rationale for Evaluation and Treatment: Rehabilitation  THERAPY DIAG:  Low back pain with left-sided sciatica, unspecified back pain laterality,  unspecified chronicity  Difficulty in walking, not elsewhere classified  Low back pain, unspecified back pain laterality, unspecified chronicity, unspecified whether sciatica present  Left leg weakness  ONSET DATE: 6 months or so  SUBJECTIVE:                                                                                                                                                                                           SUBJECTIVE STATEMENT: Pain scale 6/10 Lt side of lower back with radicular symptoms down Lt LE ending at ankle.    EVAL: Pain started about  6 months ago.  Insidious onset; couldn't get up on morning.  Pain from left low back and hip all the way down to her left foot; lower leg feels wet.  Saw PCP first and then she sent to [name].  Had x-rays done of hip and back and both showed arthritis.  Gave Prednisone ; did not help; [place] referred to therapy.  Has also had injections and those did not help.    PERTINENT HISTORY:  Bilateral hand pain  PAIN:  Are you having pain? Yes: NPRS scale: 6/10 Pain location: left side back and down left leg Pain description: feels wet Aggravating factors: standing too long, walking too long Relieving factors: sit, rest, meloxicam   PRECAUTIONS: None  WEIGHT BEARING RESTRICTIONS: No  FALLS:  Has patient fallen in last 6 months? No   OCCUPATION: CNA  PLOF: Independent  PATIENT GOALS: make me walk right  NEXT MD VISIT: PRN  OBJECTIVE:  Note: Objective measures were completed at Evaluation unless otherwise noted.  DIAGNOSTIC FINDINGS:  Clinical:  lower back pain with left sided sciatica, no trauma   X-rays were done of the lumbar spine, five views.   There is normal lumbar lordosis present of the lumbar spine.  There are small anterior osteophytes present L3 and L4.  Disc spaces are maintained.  No fracture is noted.  Bone quality is good.   Impression:  mild degenerative changes of the lumbar spine, no acute  findings.   Electronically Signed Lemond Brenna, MD 7/16/20259:40 AM  CLINICAL DATA:  Left hip pain.   EXAM: DG HIP (WITH OR WITHOUT PELVIS) 2-3V LEFT   COMPARISON:  None Available.   FINDINGS: Mild bilateral sacroiliac subchondral sclerosis. Minimal bilateral superior femoroacetabular joint space narrowing. Mild bilateral superolateral left greater than right acetabular degenerative osteophytosis.   There are left greater than right superior lateral femoral head-neck junction likely degenerative subchondral cysts, measuring up to 13 mm on the left.   Minimal superior pubic symphysis degenerative spurring.   No acute fracture or dislocation.   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    PATIENT SURVEYS:  Modified Oswestry:  MODIFIED OSWESTRY DISABILITY SCALE  Date: 04/24/24 Score  Total 32/50; 64%   Interpretation of scores: Score Category Description  0-20% Minimal Disability The patient can cope with most living activities. Usually no treatment is indicated apart from advice on lifting, sitting and exercise  21-40% Moderate Disability The patient experiences more pain and difficulty with sitting, lifting and standing. Travel and social life are more difficult and they may be disabled from work. Personal care, sexual activity and sleeping are not grossly affected, and the patient can usually be managed by conservative means  41-60% Severe Disability Pain remains the main problem in this group, but activities of daily living are affected. These patients require a detailed investigation  61-80% Crippled Back pain impinges on all aspects of the patient's life. Positive intervention is required  81-100% Bed-bound  These patients are either bed-bound or exaggerating their symptoms  [name], et al. Surgery versus conservative management of stable thoracolumbar fracture: the PRESTO feasibility RCT. [place] ([place]): [employer]; 2021 Nov. [facility] Technology Assessment, No. 25.62.) Appendix 3, Oswestry Disability Index category descriptors. Available from: [employer]cz  Minimally Clinically Important Difference (MCID) = 12.8%  COGNITION: Overall cognitive status: Within functional limits for tasks assessed     SENSATION: Decreased sensation left lower leg and foot to light touch  POSTURE: rounded shoulders, forward head,  and increased lumbar lordosis  PALPATION: Very tender on palpation to lower lumber spine and left hip and glute  LUMBAR ROM: * pain  AROM eval  Flexion Fingertips to distal pole of patella *  Extension 30% available*  Right lateral flexion Fingertips to 2 above knee joint line  Left lateral flexion 2 above knee joint line*  Right rotation   Left rotation    (Blank rows = not tested)  LOWER EXTREMITY ROM:     Active  Right eval Left eval  Hip flexion    Hip extension    Hip abduction    Hip adduction    Hip internal rotation    Hip external rotation    Knee flexion    Knee extension    Ankle dorsiflexion    Ankle plantarflexion    Ankle inversion    Ankle eversion     (Blank rows = not tested)  LOWER EXTREMITY MMT:    MMT Right eval Left eval  Hip flexion 4+ 3-  Hip extension 4- 2+  Hip abduction    Hip adduction    Hip internal rotation    Hip external rotation    Knee flexion    Knee extension 5 3+  Ankle dorsiflexion 5 4  Ankle plantarflexion    Ankle inversion    Ankle eversion     (Blank rows = not tested)  Hamstring Length RLE: 165 deg LLE: Unable to assess, pt really guarded and reports increased pain even to light touch    FUNCTIONAL TESTS:  5 times sit to stand: 23.91 sec using hands to assist up to standing  GAIT: Distance walked: 50 ft in clinic Assistive device utilized: Single point cane Level of assistance: Modified independence Comments: slight antalgic gait  TREATMENT DATE:   05/29/24: Nustep [place] UE/LE seat 6 x 5' average SPM 67 Sidelying:  - Clam RTB 10x 5 Supine:  - Posterior pelvic tilt with cueing for core activation  - Deep breathing with 1 hand on stomach/1 on chest  - TrA activation x with cueing to assure correct mm activated paired with exhale to reduce holding breathing - Bridge with RTB around thigh 10x 5 (partial raise)  - Decompression with RTB 5x 5 Seated:  - Piriformis stretch 2x 30 each side  - STS with RTB around thigh  05/23/24 Seated:  sit to stands no UE 5X  Piriformis stretch 30 each side Standing:  red TB rows 10X  Red TB extension 10X  Supine:  decompression 2-5, 10X each  Bridge 10X 3 holds  Abdominal isometrics, TrA 10X5  Lower trunk rotations 10X 5  Piriformis stretch shown in supine for alternative way (seated better)  05/21/24: Supine:  - Posterior pelvic tilt with cueing for core activation  - Deep breathing with 1 hand on stomach/1 on chest  - TrA activation x with cueing to assure correct mm activated paired with exhale to reduce holding breathing Seated  -Pelvic rotation  - Increased pain with palpation over Lt PSIS Assessed LLD and rotation, equal, no need for MET Supine:  -pubic clearance (Isometric adduction with ball, abduction with belt) 10x 5  - SKTC 2x 30 (needs towel for Lt LE) -increased pain Seated:  - abdominal sets  - seated posture  - scapular retraction 10x 5  05/18/24:  Review of goals and HEP Hip extension MMT Hamstring length testing Review of HEP/Decompression 1-5 Supine TA contraction, 5 holds, 10x, verbal cues for palpation to TA to  ensure activation  Seated Heel slides, 10x, 5 holds, wash cloth under foot Seated forward flexion with physio-ball, 5 holds,10x, emphasis on posture throughout  5x to R side Seated pelvic tilt, 3 holds, 10x, verbal cues for form and posture throughout   05/04/24 physical therapy evaluation and HEP instruction                                                                                                                                  PATIENT EDUCATION:  Education details: Patient educated on exam findings, POC, scope of PT, HEP, and what to expect next visit. Person educated: Patient Education method: Explanation, Demonstration, and Handouts Education comprehension: verbalized understanding, returned demonstration, verbal cues required, and tactile cues required   HOME EXERCISE PROGRAM: 05/29/24  Decompression exercises 1-5  Access Code: [email or web address] URL: [email or web address] Date: 05/21/2024 Prepared by: Augustin Mclean Exercises - Hooklying Isometric Hip Abduction Adduction with Belt and Ball  - 2 x daily - 7 x weekly - 1 sets - 10 reps - 5 hold - Supine Transversus Abdominis Bracing - Hands on Stomach  - 2 x daily - 7 x weekly - 2 sets - 10 reps - 5 hold - Correct Seated Posture  - 5 x daily - 7 x weekly - 1 sets - 1 reps - Seated Scapular Retraction  - 2 x daily - 7 x weekly - 2 sets - 10 reps - 5 hold   Access Code: [email or web address] URL: [email or web address] Date: 05/23/2024 Prepared by: Greig Fuse - Seated Piriformis Stretch with Trunk Bend  - 2 x daily - 7 x weekly - 2 sets - 3 reps - 30 sec hold - Sit to Stand  - 2 x daily - 7 x weekly - 2 sets - 10 reps - Standing Bilateral Low Shoulder Row with Anchored Resistance  - 2 x daily - 7 x weekly - 2 sets - 10 reps - Shoulder Extension with Resistance  - 2 x daily - 7 x weekly - 2 sets - 10 reps   ASSESSMENT:  CLINICAL IMPRESSION: Began session with Nustep for dynamic warm up, ability to keep SPM at 67 to address activity tolerance.  Added clams for gluteal strengthening with noted fatigue at rep 8, no reports of pain.  Pt with increased difficulty laying on Lt side, stated she had increased difficulty breathing.  Pt with max cueing to improve abdominal sets, tendency to arch back vs posterior pelvic tilt as well as  holding breath, NMR techniques to improve abdominal sets that improved in seated position vs supine.  EOS presents with improved gait mechancs and faster cadence with reports of pain reduced.  Pt to be reassess next session.     EVAL: Patient is a 53 y.o. female who was seen today for physical therapy evaluation and treatment for M54.32 (ICD-10-CM) - Left sided sciatica.  Patient demonstrates muscle weakness, reduced ROM, and fascial restrictions which are likely contributing to  symptoms of pain and are negatively impacting patient ability to perform ADLs and functional mobility tasks. Patient will benefit from skilled physical therapy services to address these deficits to reduce pain and improve level of function with ADLs and functional mobility tasks.   OBJECTIVE IMPAIRMENTS: Abnormal gait, decreased activity tolerance, decreased mobility, difficulty walking, decreased ROM, decreased strength, impaired perceived functional ability, and pain.   ACTIVITY LIMITATIONS: carrying, lifting, bending, standing, squatting, sleeping, and locomotion level  PARTICIPATION LIMITATIONS: meal prep, cleaning, laundry, shopping, community activity, and occupation   REHAB POTENTIAL: Good  CLINICAL DECISION MAKING: Evolving/moderate complexity  EVALUATION COMPLEXITY: Moderate   GOALS: Goals reviewed with patient? Yes  SHORT TERM GOALS: Target date: 05/18/2024  patient will be independent with initial HEP  Baseline: Goal status: INITIAL  2.  Patient will report 30% improvement overall  Baseline:  Goal status: INITIAL  LONG TERM GOALS: Target date: 06/01/2024  Patient will be independent in self management strategies to improve quality of life and functional outcomes.  Baseline:  Goal status: INITIAL  2.  Patient will report 50% improvement overall  Baseline:  Goal status: INITIAL  3.  Patient will improve Modified Oswestry score by 12 points (20/50 or less) to demonstrate improved  perceived function  Baseline: 32/50 Goal status: INITIAL  4.  Patient will improve 5 times sit to stand score to 15 sec or less to demonstrate improved functional mobility and increased leg strength.     Baseline: 23.91 sec Goal status: INITIAL  5.   Patient will increase bilateral leg MMT's to 4+ to 5/5 to allow navigation of steps without gait deviation or loss of balance  Baseline: see above Goal status: INITIAL   PLAN:  PT FREQUENCY: 2x/week  PT DURATION: 4 weeks  PLANNED INTERVENTIONS: 97164- PT Re-evaluation, 97110-Therapeutic exercises, 97530- Therapeutic activity, 97112- Neuromuscular re-education, 97535- Self Care, [id number]- Manual therapy, [id number]- Gait training, [phone]- Orthotic Fit/training, [phone]- Canalith repositioning, [id number]- Aquatic Therapy, 97760- Splinting, [id number]- Wound care (first 20 sq cm), 97598- Wound care (each additional 20 sq cm)Patient/Family education, Balance training, Stair training, Taping, Dry Needling, Joint mobilization, Joint manipulation, Spinal manipulation, Spinal mobilization, Scar mobilization, and DME instructions. SABRA  PLAN FOR NEXT SESSION: Progress decompression exercises; pelvic tilt; core and postural strengthening.  Reassess next session.  Augustin Mclean, LPTA/CLT; CBIS [phone]   9:54 AM, 05/29/24

## 2024-05-30 ENCOUNTER — Ambulatory Visit (HOSPITAL_COMMUNITY): Admitting: Physical Therapy

## 2024-05-30 DIAGNOSIS — M545 Low back pain, unspecified: Secondary | ICD-10-CM

## 2024-05-30 DIAGNOSIS — R262 Difficulty in walking, not elsewhere classified: Secondary | ICD-10-CM

## 2024-05-30 DIAGNOSIS — M5442 Lumbago with sciatica, left side: Secondary | ICD-10-CM | POA: Diagnosis not present

## 2024-05-30 DIAGNOSIS — F17210 Nicotine dependence, cigarettes, uncomplicated: Secondary | ICD-10-CM

## 2024-05-30 NOTE — Therapy (Signed)
 OUTPATIENT PHYSICAL THERAPY THORACOLUMBAR TREATMENT Progress Note Reporting Period 05/04/24 to 05/30/24  See note below for Objective Data and Assessment of Progress/Goals.       Patient Name: Veronica Arnold MRN: [id number] DOB:March 18, 1971, 53 y.o., female Today's Date: 05/30/2024  END OF SESSION:  PT End of Session - 05/30/24 0907     Visit Number 6    Number of Visits 8    Date for Recertification  06/01/24    Authorization Type [employer] CARITAS NEXT    Authorization Time Period [employer] approved 8 visits from 05/04/2024-06/01/2024    Authorization - Visit Number 6    Authorization - Number of Visits 8    Progress Note Due on Visit 8    PT Start Time 0904    PT Stop Time 0945    PT Time Calculation (min) 41 min    Activity Tolerance Patient tolerated treatment well    Behavior During Therapy [facility] for tasks assessed/performed            Past Medical History:  Diagnosis Date   High cholesterol    Hypertension    Past Surgical History:  Procedure Laterality Date   HERNIA REPAIR     TUBAL LIGATION     TUBAL LIGATION     Patient Active Problem List   Diagnosis Date Noted   Chronic left-sided low back pain with left-sided sciatica 01/24/2024   Greater trochanteric bursitis of left hip 12/28/2023   Bilateral hand pain 10/04/2023   Right foot pain 04/26/2023   Wheezing 01/26/2023   Prediabetes 12/02/2022   Vitamin D  deficiency 12/02/2022   Colon cancer screening 12/02/2022   Breast cancer screening by mammogram 12/02/2022   Need for Tdap vaccination 12/02/2022   Encounter for general adult medical examination with abnormal findings 07/19/2022   Essential hypertension 11/30/2015   Hyperlipidemia 11/30/2015   AP (abdominal pain) 11/30/2015   Cigarette nicotine dependence without complication 11/30/2015    PCP: Bevely Doffing FNP  REFERRING PROVIDER: Brenna Lin, MD  REFERRING DIAG: [phone] (ICD-10-CM) - Left sided sciatica  Rationale for Evaluation  and Treatment: Rehabilitation  THERAPY DIAG:  No diagnosis found.  ONSET DATE: 6 months or so  SUBJECTIVE:                                                                                                                                                                                           SUBJECTIVE STATEMENT: Pt reports she's having a little pain, not much.  Varies from being painfree to 10/10 which usually occurs middle of night.  Sometimes radiates to Lt side but feels it's also starting in the  Rt.  Pt reports overall she feels 50% improved as radicular symptoms are not occurring as much as was.  Compliance with HEP.    EVAL: Pain started about 6 months ago.  Insidious onset; couldn't get up on morning.  Pain from left low back and hip all the way down to her left foot; lower leg feels wet.  Saw PCP first and then she sent to [name].  Had x-rays done of hip and back and both showed arthritis.  Gave Prednisone ; did not help; [place] referred to therapy.  Has also had injections and those did not help.    PERTINENT HISTORY:  Bilateral hand pain  PAIN:  Are you having pain? Yes: NPRS scale: 6/10 Pain location: left side back and down left leg Pain description: feels wet Aggravating factors: standing too long, walking too long Relieving factors: sit, rest, meloxicam   PRECAUTIONS: None  WEIGHT BEARING RESTRICTIONS: No  FALLS:  Has patient fallen in last 6 months? No   OCCUPATION: CNA  PLOF: Independent  PATIENT GOALS: make me walk right  NEXT MD VISIT: PRN  OBJECTIVE:  Note: Objective measures were completed at Evaluation unless otherwise noted.  DIAGNOSTIC FINDINGS:  Clinical:  lower back pain with left sided sciatica, no trauma   X-rays were done of the lumbar spine, five views.   There is normal lumbar lordosis present of the lumbar spine.  There are small anterior osteophytes present L3 and L4.  Disc spaces are maintained.  No fracture is noted.  Bone  quality is good.   Impression:  mild degenerative changes of the lumbar spine, no acute findings.   Electronically Signed Lemond Brenna, MD 7/16/20259:40 AM  CLINICAL DATA:  Left hip pain.   EXAM: DG HIP (WITH OR WITHOUT PELVIS) 2-3V LEFT   COMPARISON:  None Available.   FINDINGS: Mild bilateral sacroiliac subchondral sclerosis. Minimal bilateral superior femoroacetabular joint space narrowing. Mild bilateral superolateral left greater than right acetabular degenerative osteophytosis.   There are left greater than right superior lateral femoral head-neck junction likely degenerative subchondral cysts, measuring up to 13 mm on the left.   Minimal superior pubic symphysis degenerative spurring.   No acute fracture or dislocation.   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    PATIENT SURVEYS:  Modified Oswestry:  MODIFIED OSWESTRY DISABILITY SCALE  Date: 04/24/24 Score  Total 32/50; 64%   Interpretation of scores: Score Category Description  0-20% Minimal Disability The patient can cope with most living activities. Usually no treatment is indicated apart from advice on lifting, sitting and exercise  21-40% Moderate Disability The patient experiences more pain and difficulty with sitting, lifting and standing. Travel and social life are more difficult and they may be disabled from work. Personal care, sexual activity and sleeping are not grossly affected, and the patient can usually be managed by conservative means  41-60% Severe Disability Pain remains the main problem in this group, but activities of daily living are affected. These patients require a detailed investigation  61-80% Crippled Back pain impinges on all aspects of the patient's life. Positive intervention is required  81-100% Bed-bound  These patients are either bed-bound or exaggerating their symptoms  [name], et al. Surgery versus conservative  management of stable thoracolumbar fracture: the PRESTO feasibility RCT. [place] ([place]): [employer]; 2021 Nov. [facility] Technology Assessment, No. 25.62.) Appendix 3, Oswestry Disability Index category descriptors. Available from: FindJewel[employer].cz  Minimally Clinically Important Difference (MCID) = 12.8%  COGNITION:  Overall cognitive status: Within functional limits for tasks assessed     SENSATION: Decreased sensation left lower leg and foot to light touch  POSTURE: rounded shoulders, forward head, and increased lumbar lordosis  PALPATION: Very tender on palpation to lower lumber spine and left hip and glute  LUMBAR ROM: * pain  AROM eval 05/30/24  Flexion Fingertips to distal pole of patella * Touches toes WNL No pain  Extension 30% available* WNL No pain  Right lateral flexion Fingertips to 2 above knee joint line To knee No pain  Left lateral flexion 2 above knee joint line* To knee No pain  Right rotation    Left rotation     (Blank rows = not tested)  LOWER EXTREMITY ROM:     Active  Right eval Left eval  Hip flexion    Hip extension    Hip abduction    Hip adduction    Hip internal rotation    Hip external rotation    Knee flexion    Knee extension    Ankle dorsiflexion    Ankle plantarflexion    Ankle inversion    Ankle eversion     (Blank rows = not tested)  LOWER EXTREMITY MMT:    MMT Right eval Left eval Right 05/30/24 Left 05/30/24  Hip flexion 4+ 3- 5 3-  Hip extension 4- 2+ 3- prone 3- prone  Hip abduction   5 4  Hip adduction      Hip internal rotation      Hip external rotation      Knee flexion   5 3-  Knee extension 5 3+ 5 4  Ankle dorsiflexion 5 4 5  4+  Ankle plantarflexion      Ankle inversion      Ankle eversion       (Blank rows = not tested)  Hamstring Length RLE: 165 deg LLE: Unable to assess, pt really guarded and reports increased pain even to light touch    FUNCTIONAL TESTS:   5 times sit to stand:  23.91 sec using hands to assist up to standing   05/30/24 PROGRESS NOTE: Modified oswestry:  23 / 50 = 46.0 % (was 32/50; 64%) 5 times sit to stand: 05/30/24: 16.06 no UE assist (was 23.91 sec using hands to assist up to standing) : 226 feet with SPC  GAIT: Distance walked: 50 ft in clinic Assistive device utilized: Single point cane Level of assistance: Modified independence Comments: slight antalgic gait  TREATMENT DATE:  05/30/24 PROGRESS NOTE: Modified oswestry:  23 / 50 = 46.0 % (was 32/50; 64%) 5 times sit to stand: 05/30/24: 16.06 no UE assist (was 23.91 sec using hands to assist up to standing) Lumbar ROM (see above chart) MMT (see above chart) : 226 feet with SPC Goal review Theraband exercises:  rows, shoulder extension RTB 2X10 Nustep [place] UE/LE seat 6 x 5' average SPM 67  05/29/24: Nustep [place] UE/LE seat 6 x 5' average SPM 67 Sidelying:  - Clam RTB 10x 5 Supine:  - Posterior pelvic tilt with cueing for core activation  - Deep breathing with 1 hand on stomach/1 on chest  - TrA activation x with cueing to assure correct mm activated paired with exhale to reduce holding breathing - Bridge with RTB around thigh 10x 5 (partial raise)  - Decompression with RTB 5x 5 Seated:  - Piriformis stretch 2x 30 each side  - STS with RTB around thigh  05/23/24 Seated:  sit  to stands no UE 5X  Piriformis stretch 30 each side Standing:  red TB rows 10X  Red TB extension 10X  Supine:  decompression 2-5, 10X each  Bridge 10X 3 holds  Abdominal isometrics, TrA 10X5  Lower trunk rotations 10X 5  Piriformis stretch shown in supine for alternative way (seated better)  05/21/24: Supine:  - Posterior pelvic tilt with cueing for core activation  - Deep breathing with 1 hand on stomach/1 on chest  - TrA activation x with cueing to assure correct mm activated paired with exhale to reduce holding  breathing Seated  -Pelvic rotation  - Increased pain with palpation over Lt PSIS Assessed LLD and rotation, equal, no need for MET Supine:  -pubic clearance (Isometric adduction with ball, abduction with belt) 10x 5  - SKTC 2x 30 (needs towel for Lt LE) -increased pain Seated:  - abdominal sets  - seated posture  - scapular retraction 10x 5  05/18/24:  Review of goals and HEP Hip extension MMT Hamstring length testing Review of HEP/Decompression 1-5 Supine TA contraction, 5 holds, 10x, verbal cues for palpation to TA to ensure activation  Seated Heel slides, 10x, 5 holds, wash cloth under foot Seated forward flexion with physio-ball, 5 holds,10x, emphasis on posture throughout  5x to R side Seated pelvic tilt, 3 holds, 10x, verbal cues for form and posture throughout   05/04/24 physical therapy evaluation and HEP instruction                                                                                                                                 PATIENT EDUCATION:  Education details: Patient educated on exam findings, POC, scope of PT, HEP, and what to expect next visit. Person educated: Patient Education method: Explanation, Demonstration, and Handouts Education comprehension: verbalized understanding, returned demonstration, verbal cues required, and tactile cues required   HOME EXERCISE PROGRAM: Access Code: [email or web address] URL: [email or web address] Date: 05/21/2024 Prepared by: Augustin Mclean Exercises - Hooklying Isometric Hip Abduction Adduction with Belt and Ball  - 2 x daily - 7 x weekly - 1 sets - 10 reps - 5 hold - Supine Transversus Abdominis Bracing - Hands on Stomach  - 2 x daily - 7 x weekly - 2 sets - 10 reps - 5 hold - Correct Seated Posture  - 5 x daily - 7 x weekly - 1 sets - 1 reps - Seated Scapular Retraction  - 2 x daily - 7 x weekly - 2 sets - 10 reps - 5 hold   Access Code: [email or web address] URL:  [email or web address] Date: 05/23/2024 Prepared by: Greig Fuse - Seated Piriformis Stretch with Trunk Bend  - 2 x daily - 7 x weekly - 2 sets - 3 reps - 30 sec hold - Sit to Stand  - 2 x daily - 7 x weekly - 2 sets - 10 reps - Standing Bilateral Low Shoulder Row with Anchored Resistance  - 2  x daily - 7 x weekly - 2 sets - 10 reps - Shoulder Extension with Resistance  - 2 x daily - 7 x weekly - 2 sets - 10 reps  05/29/24 Decompression exercises 1-5  05/30/24:  theraband rows and shoulder extension 2X10 each day red band  ASSESSMENT:  CLINICAL IMPRESSION: Progress note completed this session with pt meeting subjective goals but not yet meeting objective measure goals.  Pt is overall making progress towards remaining goals and would benefit from continued therapy.  2 minute walk test initiated this session with ability to make full lap in gym with SPC.  Began postural strengthening using bands and added these to HEP today.  Continued with Nustep for activity tolerance. Request to continue 2X week for 4 more weeks.  EVAL: Patient is a 53 y.o. female who was seen today for physical therapy evaluation and treatment for M54.32 (ICD-10-CM) - Left sided sciatica.  Patient demonstrates muscle weakness, reduced ROM, and fascial restrictions which are likely contributing to symptoms of pain and are negatively impacting patient ability to perform ADLs and functional mobility tasks. Patient will benefit from skilled physical therapy services to address these deficits to reduce pain and improve level of function with ADLs and functional mobility tasks.   OBJECTIVE IMPAIRMENTS: Abnormal gait, decreased activity tolerance, decreased mobility, difficulty walking, decreased ROM, decreased strength, impaired perceived functional ability, and pain.   ACTIVITY LIMITATIONS: carrying, lifting, bending, standing, squatting, sleeping, and locomotion level  PARTICIPATION LIMITATIONS: meal prep, cleaning,  laundry, shopping, community activity, and occupation   REHAB POTENTIAL: Good  CLINICAL DECISION MAKING: Evolving/moderate complexity  EVALUATION COMPLEXITY: Moderate   GOALS: Goals reviewed with patient? Yes  SHORT TERM GOALS: Target date: 05/18/2024  patient will be independent with initial HEP  Baseline: Goal status: MET  2.  Patient will report 30% improvement overall  Baseline:  Goal status: MET  LONG TERM GOALS: Target date: 06/01/2024  Patient will be independent in self management strategies to improve quality of life and functional outcomes.  Baseline:  Goal status: IN PROGRESS  2.  Patient will report 50% improvement overall  Baseline:  Goal status: MET  3.  Patient will improve Modified Oswestry score by 12 points (20/50 or less) to demonstrate improved perceived function  Baseline: 32/50 Goal status: IN PROGRESS  4.  Patient will improve 5 times sit to stand score to 15 sec or less to demonstrate improved functional mobility and increased leg strength.     Baseline: 23.91 sec Goal status: IN PROGRESS  5.   Patient will increase bilateral leg MMT's to 4+ to 5/5 to allow navigation of steps without gait deviation or loss of balance  Baseline: see above Goal status: IN PROGRESS   PLAN:  PT FREQUENCY: 2x/week  PT DURATION: 4 weeks  PLANNED INTERVENTIONS: 97164- PT Re-evaluation, 97110-Therapeutic exercises, 97530- Therapeutic activity, 97112- Neuromuscular re-education, 97535- Self Care, 02859- Manual therapy, [id number]- Gait training, [id number]- Orthotic Fit/training, [phone]- Canalith repositioning, [id number]- Aquatic Therapy, 97760- Splinting, [id number]- Wound care (first 20 sq cm), 97598- Wound care (each additional 20 sq cm)Patient/Family education, Balance training, Stair training, Taping, Dry Needling, Joint mobilization, Joint manipulation, Spinal manipulation, Spinal mobilization, Scar mobilization, and DME instructions. SABRA  PLAN FOR NEXT SESSION: Progress  decompression exercises; pelvic tilt; core and postural strengthening. Continue 2X week for 4 more weeks.  [name], PTA/CLT [facility] Ph: [phone]  9:08 AM, 05/30/24

## 2024-05-30 NOTE — Telephone Encounter (Signed)
 Pt called and lvm stating no one ever called her back regarding her pain meds.  Please call her and advise.

## 2024-05-31 ENCOUNTER — Ambulatory Visit: Payer: Self-pay

## 2024-05-31 NOTE — Telephone Encounter (Signed)
 FYI Only or Action Required?: Action required by provider: request for appointment.  Patient was last seen in primary care on 05/08/2024 by Bevely Doffing, FNP.  Called Nurse Triage reporting Recurrent Skin Infections.  Symptoms began several days ago.  Interventions attempted: Nothing.  Symptoms are: unchanged.  Triage Disposition: See PCP When Office is Open (Within 3 Days)  Patient/caregiver understands and will follow disposition?: No, wishes to speak with PCP  Copied from CRM #[id number]. Topic: Clinical - Red Word Triage >> May 31, 2024  2:16 PM Antony RAMAN wrote: Red Word that prompted transfer to Nurse Triage: knot on hand and butt cheek, swelling and pain. 2 days Reason for Disposition  [1] Small swelling or lump AND [2] unexplained AND [3] present > 1 week  Answer Assessment - Initial Assessment Questions No available appts with PCP or clinic. Offered appt today at alternative clinic, pt declined. Advised UC today, pt reports insurance does not cover UC and will go to ED if it gets worse.  1. APPEARANCE of SWELLING: What does it look like?     No  2. SIZE: How large is the swelling? (e.g., inches, cm; or compare to size of pinhead, tip of pen, eraser, coin, pea, grape, ping pong ball)      Size grape 3. LOCATION: Where is the swelling located?     Left hand, buttock left; Deniesswelling, redness; red streaks, open skin, injuries 4. ONSET: When did the swelling start?     3 days ago; nodule, firm on hand, soft on buttock 5. COLOR: What color is it? Is there more than one color?     Same color as rest of body 6. PAIN: Is there any pain? If Yes, ask: How bad is the pain? (Scale 1-10; or mild, moderate, severe)       5/10; only with touch and when waving 7. ITCH: Does it itch? If Yes, ask: How bad is the itch?      no 8. CAUSE: What do you think caused the swelling?     unsure 9 OTHER SYMPTOMS: Do you have any other symptoms? (e.g., fever)     Denies  numbness, tingling, weakness, fever  Protocols used: Skin Lump or Localized Swelling-A-AH

## 2024-05-31 NOTE — Addendum Note (Signed)
 Addended byBETHA LEODIS NO S on: 05/31/2024 09:07 AM   Modules accepted: Orders

## 2024-06-01 NOTE — Telephone Encounter (Signed)
 Agree with referral to UC or ER

## 2024-06-04 ENCOUNTER — Ambulatory Visit (HOSPITAL_COMMUNITY): Admitting: Physical Therapy

## 2024-06-04 DIAGNOSIS — M545 Low back pain, unspecified: Secondary | ICD-10-CM

## 2024-06-04 DIAGNOSIS — M5442 Lumbago with sciatica, left side: Secondary | ICD-10-CM | POA: Diagnosis not present

## 2024-06-04 DIAGNOSIS — R262 Difficulty in walking, not elsewhere classified: Secondary | ICD-10-CM

## 2024-06-04 NOTE — Therapy (Signed)
 OUTPATIENT PHYSICAL THERAPY THORACOLUMBAR TREATMENT    Patient Name: Veronica Arnold MRN: [id number] DOB:01-04-1971, 53 y.o., female Today's Date: 06/04/2024  END OF SESSION:  PT End of Session - 06/04/24 1108     Visit Number 7    Number of Visits 14    Date for Recertification  06/28/24    Authorization Type [employer] NEXT    Authorization Time Period [employer] approved 8 more visits 06/02/24-08/03/24    Authorization - Visit Number 1    Authorization - Number of Visits 8    Progress Note Due on Visit 14    PT Start Time 1035    PT Stop Time 1115    PT Time Calculation (min) 40 min    Activity Tolerance Patient tolerated treatment well    Behavior During Therapy [facility] for tasks assessed/performed             Past Medical History:  Diagnosis Date   High cholesterol    Hypertension    Past Surgical History:  Procedure Laterality Date   HERNIA REPAIR     TUBAL LIGATION     TUBAL LIGATION     Patient Active Problem List   Diagnosis Date Noted   Chronic left-sided low back pain with left-sided sciatica 01/24/2024   Greater trochanteric bursitis of left hip 12/28/2023   Bilateral hand pain 10/04/2023   Right foot pain 04/26/2023   Wheezing 01/26/2023   Prediabetes 12/02/2022   Vitamin D  deficiency 12/02/2022   Colon cancer screening 12/02/2022   Breast cancer screening by mammogram 12/02/2022   Need for Tdap vaccination 12/02/2022   Encounter for general adult medical examination with abnormal findings 07/19/2022   Essential hypertension 11/30/2015   Hyperlipidemia 11/30/2015   AP (abdominal pain) 11/30/2015   Cigarette nicotine dependence without complication 11/30/2015    PCP: Bevely Doffing FNP  REFERRING PROVIDER: Brenna Lin, MD  REFERRING DIAG: [phone] (ICD-10-CM) - Left sided sciatica  Rationale for Evaluation and Treatment: Rehabilitation  THERAPY DIAG:  Low back pain with left-sided sciatica, unspecified back pain laterality,  unspecified chronicity  Difficulty in walking, not elsewhere classified  Low back pain, unspecified back pain laterality, unspecified chronicity, unspecified whether sciatica present  ONSET DATE: 6 months or so  SUBJECTIVE:                                                                                                                                                                                           SUBJECTIVE STATEMENT: Insurance approved 8 more sessions.  Pt reports she's still having a little pain, using her SPC. 5/10 pain more to Lt side.Compliance with HEP  everyday.    EVAL: Pain started about 6 months ago.  Insidious onset; couldn't get up on morning.  Pain from left low back and hip all the way down to her left foot; lower leg feels wet.  Saw PCP first and then she sent to [name].  Had x-rays done of hip and back and both showed arthritis.  Gave Prednisone ; did not help; [place] referred to therapy.  Has also had injections and those did not help.    PERTINENT HISTORY:  Bilateral hand pain  PAIN:  Are you having pain? Yes: NPRS scale: 5/10 Pain location: left side back and down left leg Pain description: feels wet Aggravating factors: standing too long, walking too long Relieving factors: sit, rest, meloxicam   PRECAUTIONS: None  WEIGHT BEARING RESTRICTIONS: No  FALLS:  Has patient fallen in last 6 months? No   OCCUPATION: CNA  PLOF: Independent  PATIENT GOALS: make me walk right  NEXT MD VISIT: PRN  OBJECTIVE:  Note: Objective measures were completed at Evaluation unless otherwise noted.  DIAGNOSTIC FINDINGS:  Clinical:  lower back pain with left sided sciatica, no trauma   X-rays were done of the lumbar spine, five views.   There is normal lumbar lordosis present of the lumbar spine.  There are small anterior osteophytes present L3 and L4.  Disc spaces are maintained.  No fracture is noted.  Bone quality is good.   Impression:  mild  degenerative changes of the lumbar spine, no acute findings.   Electronically Signed Lemond Brenna, MD 7/16/20259:40 AM  CLINICAL DATA:  Left hip pain.   EXAM: DG HIP (WITH OR WITHOUT PELVIS) 2-3V LEFT   COMPARISON:  None Available.   FINDINGS: Mild bilateral sacroiliac subchondral sclerosis. Minimal bilateral superior femoroacetabular joint space narrowing. Mild bilateral superolateral left greater than right acetabular degenerative osteophytosis.   There are left greater than right superior lateral femoral head-neck junction likely degenerative subchondral cysts, measuring up to 13 mm on the left.   Minimal superior pubic symphysis degenerative spurring.   No acute fracture or dislocation.   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    PATIENT SURVEYS:  Modified Oswestry:  MODIFIED OSWESTRY DISABILITY SCALE  Date: 04/24/24 Score  Total 32/50; 64%   Interpretation of scores: Score Category Description  0-20% Minimal Disability The patient can cope with most living activities. Usually no treatment is indicated apart from advice on lifting, sitting and exercise  21-40% Moderate Disability The patient experiences more pain and difficulty with sitting, lifting and standing. Travel and social life are more difficult and they may be disabled from work. Personal care, sexual activity and sleeping are not grossly affected, and the patient can usually be managed by conservative means  41-60% Severe Disability Pain remains the main problem in this group, but activities of daily living are affected. These patients require a detailed investigation  61-80% Crippled Back pain impinges on all aspects of the patient's life. Positive intervention is required  81-100% Bed-bound  These patients are either bed-bound or exaggerating their symptoms  [name], et al. Surgery versus conservative management of stable thoracolumbar fracture: the  PRESTO feasibility RCT. [place] ([place]): [employer]; 2021 Nov. [facility] Technology Assessment, No. 25.62.) Appendix 3, Oswestry Disability Index category descriptors. Available from: FindJewel[employer].cz  Minimally Clinically Important Difference (MCID) = 12.8%  COGNITION: Overall cognitive status: Within functional limits for tasks assessed     SENSATION: Decreased sensation left lower leg and foot to  light touch  POSTURE: rounded shoulders, forward head, and increased lumbar lordosis  PALPATION: Very tender on palpation to lower lumber spine and left hip and glute  LUMBAR ROM: * pain  AROM eval 05/30/24  Flexion Fingertips to distal pole of patella * Touches toes WNL No pain  Extension 30% available* WNL No pain  Right lateral flexion Fingertips to 2 above knee joint line To knee No pain  Left lateral flexion 2 above knee joint line* To knee No pain  Right rotation    Left rotation     (Blank rows = not tested)  LOWER EXTREMITY ROM:     Active  Right eval Left eval  Hip flexion    Hip extension    Hip abduction    Hip adduction    Hip internal rotation    Hip external rotation    Knee flexion    Knee extension    Ankle dorsiflexion    Ankle plantarflexion    Ankle inversion    Ankle eversion     (Blank rows = not tested)  LOWER EXTREMITY MMT:    MMT Right eval Left eval Right 05/30/24 Left 05/30/24  Hip flexion 4+ 3- 5 3-  Hip extension 4- 2+ 3- prone 3- prone  Hip abduction   5 4  Hip adduction      Hip internal rotation      Hip external rotation      Knee flexion   5 3-  Knee extension 5 3+ 5 4  Ankle dorsiflexion 5 4 5  4+  Ankle plantarflexion      Ankle inversion      Ankle eversion       (Blank rows = not tested)  Hamstring Length RLE: 165 deg LLE: Unable to assess, pt really guarded and reports increased pain even to light touch    FUNCTIONAL TESTS:  5 times sit to stand:  23.91 sec using hands to  assist up to standing   05/30/24 PROGRESS NOTE: Modified oswestry:  23 / 50 = 46.0 % (was 32/50; 64%) 5 times sit to stand: 05/30/24: 16.06 no UE assist (was 23.91 sec using hands to assist up to standing) : 226 feet with SPC  GAIT: Distance walked: 50 ft in clinic Assistive device utilized: Single point cane Level of assistance: Modified independence Comments: slight antalgic gait  TREATMENT DATE:  06/04/24: Standing: in // bars with bil UE assist heelraises 20X  Hip abduction 2X10 each LE  Hip extension 2X10 each LE  High marching 2X10 each LE  RTB rows 2X10  RTB shoulder extensions 2X10  RTB pallof presses 2X10 each Sit to stands no UE 2X10 Nustep  UE/LE seat 6 x 5' average level 3  05/30/24 PROGRESS NOTE: Modified oswestry:  23 / 50 = 46.0 % (was 32/50; 64%) 5 times sit to stand: 05/30/24: 16.06 no UE assist (was 23.91 sec using hands to assist up to standing) Lumbar ROM (see above chart) MMT (see above chart) : 226 feet with SPC Goal review Theraband exercises:  rows, shoulder extension RTB 2X10 Nustep [place] UE/LE seat 6 x 5' average SPM 67  05/29/24: Nustep [place] UE/LE seat 6 x 5' average SPM 67 Sidelying:  - Clam RTB 10x 5 Supine:  - Posterior pelvic tilt with cueing for core activation  - Deep breathing with 1 hand on stomach/1 on chest  - TrA activation x with cueing to assure correct mm activated paired with exhale to reduce holding  breathing - Bridge with RTB around thigh 10x 5 (partial raise)  - Decompression with RTB 5x 5 Seated:  - Piriformis stretch 2x 30 each side  - STS with RTB around thigh  05/23/24 Seated:  sit to stands no UE 5X  Piriformis stretch 30 each side Standing:  red TB rows 10X  Red TB extension 10X  Supine:  decompression 2-5, 10X each  Bridge 10X 3 holds  Abdominal isometrics, TrA 10X5  Lower trunk rotations 10X 5  Piriformis stretch shown in supine for alternative way (seated  better)  05/21/24: Supine:  - Posterior pelvic tilt with cueing for core activation  - Deep breathing with 1 hand on stomach/1 on chest  - TrA activation x with cueing to assure correct mm activated paired with exhale to reduce holding breathing Seated  -Pelvic rotation  - Increased pain with palpation over Lt PSIS Assessed LLD and rotation, equal, no need for MET Supine:  -pubic clearance (Isometric adduction with ball, abduction with belt) 10x 5  - SKTC 2x 30 (needs towel for Lt LE) -increased pain Seated:  - abdominal sets  - seated posture  - scapular retraction 10x 5  05/18/24:  Review of goals and HEP Hip extension MMT Hamstring length testing Review of HEP/Decompression 1-5 Supine TA contraction, 5 holds, 10x, verbal cues for palpation to TA to ensure activation  Seated Heel slides, 10x, 5 holds, wash cloth under foot Seated forward flexion with physio-ball, 5 holds,10x, emphasis on posture throughout  5x to R side Seated pelvic tilt, 3 holds, 10x, verbal cues for form and posture throughout   05/04/24 physical therapy evaluation and HEP instruction                                                                                                                                 PATIENT EDUCATION:  Education details: Patient educated on exam findings, POC, scope of PT, HEP, and what to expect next visit. Person educated: Patient Education method: Explanation, Demonstration, and Handouts Education comprehension: verbalized understanding, returned demonstration, verbal cues required, and tactile cues required   HOME EXERCISE PROGRAM: Access Code: [email or web address] URL: [email or web address] Date: 05/21/2024 Prepared by: Augustin Mclean Exercises - Hooklying Isometric Hip Abduction Adduction with Belt and Ball  - 2 x daily - 7 x weekly - 1 sets - 10 reps - 5 hold - Supine Transversus Abdominis Bracing - Hands on Stomach  - 2 x daily - 7 x weekly - 2  sets - 10 reps - 5 hold - Correct Seated Posture  - 5 x daily - 7 x weekly - 1 sets - 1 reps - Seated Scapular Retraction  - 2 x daily - 7 x weekly - 2 sets - 10 reps - 5 hold   Access Code: [email or web address] URL: [email or web address] Date: 05/23/2024 Prepared by: Greig Fuse - Seated Piriformis Stretch with Trunk Bend  - 2 x daily - 7 x weekly - 2  sets - 3 reps - 30 sec hold - Sit to Stand  - 2 x daily - 7 x weekly - 2 sets - 10 reps - Standing Bilateral Low Shoulder Row with Anchored Resistance  - 2 x daily - 7 x weekly - 2 sets - 10 reps - Shoulder Extension with Resistance  - 2 x daily - 7 x weekly - 2 sets - 10 reps  05/29/24 Decompression exercises 1-5  05/30/24:  theraband rows and shoulder extension 2X10 each day red band  ASSESSMENT:  CLINICAL IMPRESSION: Progressed with additional of LE strengthening/stabilization activities as continues to present weakness here.  Pt able to complete all new challenges without c/o pain or issues.  Most difficulty maintaining pallof in straight line due to core weakness. Ended session with Nustep for activity tolerance. Pt will continue to benefit from skilled therapy to progress towards goals.   EVAL: Patient is a 53 y.o. female who was seen today for physical therapy evaluation and treatment for M54.32 (ICD-10-CM) - Left sided sciatica.  Patient demonstrates muscle weakness, reduced ROM, and fascial restrictions which are likely contributing to symptoms of pain and are negatively impacting patient ability to perform ADLs and functional mobility tasks. Patient will benefit from skilled physical therapy services to address these deficits to reduce pain and improve level of function with ADLs and functional mobility tasks.   OBJECTIVE IMPAIRMENTS: Abnormal gait, decreased activity tolerance, decreased mobility, difficulty walking, decreased ROM, decreased strength, impaired perceived functional ability, and pain.   ACTIVITY LIMITATIONS:  carrying, lifting, bending, standing, squatting, sleeping, and locomotion level  PARTICIPATION LIMITATIONS: meal prep, cleaning, laundry, shopping, community activity, and occupation   REHAB POTENTIAL: Good  CLINICAL DECISION MAKING: Evolving/moderate complexity  EVALUATION COMPLEXITY: Moderate   GOALS: Goals reviewed with patient? Yes  SHORT TERM GOALS: Target date: 05/18/2024  patient will be independent with initial HEP  Baseline: Goal status: MET  2.  Patient will report 30% improvement overall  Baseline:  Goal status: MET  LONG TERM GOALS: Target date: 06/01/2024  Patient will be independent in self management strategies to improve quality of life and functional outcomes.  Baseline:  Goal status: IN PROGRESS  2.  Patient will report 50% improvement overall  Baseline:  Goal status: MET  3.  Patient will improve Modified Oswestry score by 12 points (20/50 or less) to demonstrate improved perceived function  Baseline: 32/50 Goal status: IN PROGRESS  4.  Patient will improve 5 times sit to stand score to 15 sec or less to demonstrate improved functional mobility and increased leg strength.     Baseline: 23.91 sec Goal status: IN PROGRESS  5.   Patient will increase bilateral leg MMT's to 4+ to 5/5 to allow navigation of steps without gait deviation or loss of balance  Baseline: see above Goal status: IN PROGRESS   PLAN:  PT FREQUENCY: 2x/week  PT DURATION: 4 weeks  PLANNED INTERVENTIONS: 97164- PT Re-evaluation, 97110-Therapeutic exercises, 97530- Therapeutic activity, 97112- Neuromuscular re-education, 97535- Self Care, 02859- Manual therapy, [id number]- Gait training, [phone]- Orthotic Fit/training, [id number]- Canalith repositioning, [id number]- Aquatic Therapy, 97760- Splinting, [id number]- Wound care (first 20 sq cm), 97598- Wound care (each additional 20 sq cm)Patient/Family education, Balance training, Stair training, Taping, Dry Needling, Joint mobilization, Joint  manipulation, Spinal manipulation, Spinal mobilization, Scar mobilization, and DME instructions. SABRA  PLAN FOR NEXT SESSION: Progress functional, core and postural strengthening. Continue 2X week for 4 more weeks.  [name], PTA/CLT [facility] Ph:  [phone]  11:13 AM, 06/04/24

## 2024-06-05 NOTE — Addendum Note (Signed)
 Addended by: MARCINE HUSBAND T on: 06/05/2024 09:16 AM   Modules accepted: Orders

## 2024-06-06 ENCOUNTER — Ambulatory Visit (HOSPITAL_COMMUNITY): Admitting: Physical Therapy

## 2024-06-06 DIAGNOSIS — M5442 Lumbago with sciatica, left side: Secondary | ICD-10-CM | POA: Diagnosis present

## 2024-06-06 DIAGNOSIS — R262 Difficulty in walking, not elsewhere classified: Secondary | ICD-10-CM | POA: Insufficient documentation

## 2024-06-06 DIAGNOSIS — R29898 Other symptoms and signs involving the musculoskeletal system: Secondary | ICD-10-CM | POA: Diagnosis present

## 2024-06-06 DIAGNOSIS — M545 Low back pain, unspecified: Secondary | ICD-10-CM | POA: Diagnosis present

## 2024-06-06 NOTE — Therapy (Signed)
 OUTPATIENT PHYSICAL THERAPY THORACOLUMBAR TREATMENT    Patient Name: Veronica Arnold MRN: [id number] DOB:01/29/1971, 53 y.o., female Today's Date: 06/06/2024  END OF SESSION:  PT End of Session - 06/06/24 0955     Visit Number 8    Number of Visits 14    Date for Recertification  06/28/24    Authorization Type [employer] CARITAS NEXT    Authorization Time Period [employer] approved 8 more visits 06/02/24-08/03/24    Authorization - Visit Number 2    Authorization - Number of Visits 8    Progress Note Due on Visit 14    PT Start Time 0950    PT Stop Time 1030    PT Time Calculation (min) 40 min    Activity Tolerance Patient tolerated treatment well    Behavior During Therapy [facility] for tasks assessed/performed             Past Medical History:  Diagnosis Date   High cholesterol    Hypertension    Past Surgical History:  Procedure Laterality Date   HERNIA REPAIR     TUBAL LIGATION     TUBAL LIGATION     Patient Active Problem List   Diagnosis Date Noted   Chronic left-sided low back pain with left-sided sciatica 01/24/2024   Greater trochanteric bursitis of left hip 12/28/2023   Bilateral hand pain 10/04/2023   Right foot pain 04/26/2023   Wheezing 01/26/2023   Prediabetes 12/02/2022   Vitamin D  deficiency 12/02/2022   Colon cancer screening 12/02/2022   Breast cancer screening by mammogram 12/02/2022   Need for Tdap vaccination 12/02/2022   Encounter for general adult medical examination with abnormal findings 07/19/2022   Essential hypertension 11/30/2015   Hyperlipidemia 11/30/2015   AP (abdominal pain) 11/30/2015   Cigarette nicotine dependence without complication 11/30/2015    PCP: Bevely Doffing FNP  REFERRING PROVIDER: Brenna Lin, MD  REFERRING DIAG: [phone] (ICD-10-CM) - Left sided sciatica  Rationale for Evaluation and Treatment: Rehabilitation  THERAPY DIAG:  Low back pain with left-sided sciatica, unspecified back pain laterality,  unspecified chronicity  Difficulty in walking, not elsewhere classified  ONSET DATE: 6 months or so  SUBJECTIVE:                                                                                                                                                                                           SUBJECTIVE STATEMENT: Pt reports no improvement, 8/10.  Reports she's asking for second opinion at her ortho appt.    EVAL: Pain started about 6 months ago.  Insidious onset; couldn't get up on morning.  Pain from left low  back and hip all the way down to her left foot; lower leg feels wet.  Saw PCP first and then she sent to [name].  Had x-rays done of hip and back and both showed arthritis.  Gave Prednisone ; did not help; [place] referred to therapy.  Has also had injections and those did not help.    PERTINENT HISTORY:  Bilateral hand pain  PAIN:  Are you having pain? Yes: NPRS scale: 8/10 Pain location: left side back and down left leg Pain description: feels wet Aggravating factors: standing too long, walking too long Relieving factors: sit, rest, meloxicam   PRECAUTIONS: None  WEIGHT BEARING RESTRICTIONS: No  FALLS:  Has patient fallen in last 6 months? No   OCCUPATION: CNA  PLOF: Independent  PATIENT GOALS: make me walk right  NEXT MD VISIT: PRN  OBJECTIVE:  Note: Objective measures were completed at Evaluation unless otherwise noted.  DIAGNOSTIC FINDINGS:  Clinical:  lower back pain with left sided sciatica, no trauma   X-rays were done of the lumbar spine, five views.   There is normal lumbar lordosis present of the lumbar spine.  There are small anterior osteophytes present L3 and L4.  Disc spaces are maintained.  No fracture is noted.  Bone quality is good.   Impression:  mild degenerative changes of the lumbar spine, no acute findings.   Electronically Signed Lemond Brenna, MD 7/16/20259:40 AM  CLINICAL DATA:  Left hip pain.   EXAM: DG HIP (WITH  OR WITHOUT PELVIS) 2-3V LEFT   COMPARISON:  None Available.   FINDINGS: Mild bilateral sacroiliac subchondral sclerosis. Minimal bilateral superior femoroacetabular joint space narrowing. Mild bilateral superolateral left greater than right acetabular degenerative osteophytosis.   There are left greater than right superior lateral femoral head-neck junction likely degenerative subchondral cysts, measuring up to 13 mm on the left.   Minimal superior pubic symphysis degenerative spurring.   No acute fracture or dislocation.   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    PATIENT SURVEYS:  Modified Oswestry:  MODIFIED OSWESTRY DISABILITY SCALE  Date: 04/24/24 Score  Total 32/50; 64%   Interpretation of scores: Score Category Description  0-20% Minimal Disability The patient can cope with most living activities. Usually no treatment is indicated apart from advice on lifting, sitting and exercise  21-40% Moderate Disability The patient experiences more pain and difficulty with sitting, lifting and standing. Travel and social life are more difficult and they may be disabled from work. Personal care, sexual activity and sleeping are not grossly affected, and the patient can usually be managed by conservative means  41-60% Severe Disability Pain remains the main problem in this group, but activities of daily living are affected. These patients require a detailed investigation  61-80% Crippled Back pain impinges on all aspects of the patient's life. Positive intervention is required  81-100% Bed-bound  These patients are either bed-bound or exaggerating their symptoms  [name], et al. Surgery versus conservative management of stable thoracolumbar fracture: the PRESTO feasibility RCT. [place] ([place]): [employer]; 2021 Nov. [facility] Technology Assessment, No. 25.62.) Appendix 3, Oswestry Disability Index category descriptors.  Available from: FindJewel[employer]cz  Minimally Clinically Important Difference (MCID) = 12.8%  COGNITION: Overall cognitive status: Within functional limits for tasks assessed     SENSATION: Decreased sensation left lower leg and foot to light touch  POSTURE: rounded shoulders, forward head, and increased lumbar lordosis  PALPATION: Very tender on palpation to lower lumber spine and left  hip and glute  LUMBAR ROM: * pain  AROM eval 05/30/24  Flexion Fingertips to distal pole of patella * Touches toes WNL No pain  Extension 30% available* WNL No pain  Right lateral flexion Fingertips to 2 above knee joint line To knee No pain  Left lateral flexion 2 above knee joint line* To knee No pain  Right rotation    Left rotation     (Blank rows = not tested)  LOWER EXTREMITY MMT:    MMT Right eval Left eval Right 05/30/24 Left 05/30/24  Hip flexion 4+ 3- 5 3-  Hip extension 4- 2+ 3- prone 3- prone  Hip abduction   5 4  Hip adduction      Hip internal rotation      Hip external rotation      Knee flexion   5 3-  Knee extension 5 3+ 5 4  Ankle dorsiflexion 5 4 5  4+  Ankle plantarflexion      Ankle inversion      Ankle eversion       (Blank rows = not tested)  Hamstring Length RLE: 165 deg LLE: Unable to assess, pt really guarded and reports increased pain even to light touch    FUNCTIONAL TESTS:  5 times sit to stand:  23.91 sec using hands to assist up to standing   05/30/24 PROGRESS NOTE: Modified oswestry:  23 / 50 = 46.0 % (was 32/50; 64%) 5 times sit to stand: 05/30/24: 16.06 no UE assist (was 23.91 sec using hands to assist up to standing) : 226 feet with SPC  GAIT: Distance walked: 50 ft in clinic Assistive device utilized: Single point cane Level of assistance: Modified independence Comments: slight antalgic gait  TREATMENT DATE:  06/06/24 Nustep  UE/LE seat 6 x 5' average level 3 heelraises 20X  Hip abduction 2X10 each  LE  Hip extension 2X10 each LE  High marching 2X10 each LE  RTB rows 2X10  RTB shoulder extensions 2X10  RTB pallof presses 2X10 each  06/04/24: Standing: in // bars with bil UE assist heelraises 20X  Hip abduction 2X10 each LE  Hip extension 2X10 each LE  High marching 2X10 each LE  RTB rows 2X10  RTB shoulder extensions 2X10  RTB pallof presses 2X10 each Sit to stands no UE 2X10 Nustep  UE/LE seat 6 x 5' average level 3  05/30/24 PROGRESS NOTE: Modified oswestry:  23 / 50 = 46.0 % (was 32/50; 64%) 5 times sit to stand: 05/30/24: 16.06 no UE assist (was 23.91 sec using hands to assist up to standing) Lumbar ROM (see above chart) MMT (see above chart) : 226 feet with SPC Goal review Theraband exercises:  rows, shoulder extension RTB 2X10 Nustep [place] UE/LE seat 6 x 5' average SPM 7    PATIENT EDUCATION:  Education details: Patient educated on exam findings, POC, scope of PT, HEP, and what to expect next visit. Person educated: Patient Education method: Explanation, Demonstration, and Handouts Education comprehension: verbalized understanding, returned demonstration, verbal cues required, and tactile cues required   HOME EXERCISE PROGRAM: Access Code: [email or web address] URL: [email or web address] Date: 05/21/2024 Prepared by: Augustin Mclean Exercises - Hooklying Isometric Hip Abduction Adduction with Belt and Ball  - 2 x daily - 7 x weekly - 1 sets - 10 reps - 5 hold - Supine Transversus Abdominis Bracing - Hands on Stomach  - 2 x daily - 7 x weekly - 2 sets - 10 reps - 5 hold - Correct Seated  Posture  - 5 x daily - 7 x weekly - 1 sets - 1 reps - Seated Scapular Retraction  - 2 x daily - 7 x weekly - 2 sets - 10 reps - 5 hold   Access Code: [email or web address] URL: [email or web address] Date: 05/23/2024 Prepared by: Greig Fuse - Seated Piriformis Stretch with Trunk Bend  - 2 x daily - 7 x weekly - 2 sets - 3 reps - 30 sec hold - Sit to  Stand  - 2 x daily - 7 x weekly - 2 sets - 10 reps - Standing Bilateral Low Shoulder Row with Anchored Resistance  - 2 x daily - 7 x weekly - 2 sets - 10 reps - Shoulder Extension with Resistance  - 2 x daily - 7 x weekly - 2 sets - 10 reps  05/29/24 Decompression exercises 1-5  05/30/24:  theraband rows and shoulder extension 2X10 each day red band  ASSESSMENT:  CLINICAL IMPRESSION: Continued with LE strengthening/stabilization activities following nustep warm up. Pt required both verbal and tactile cues with posturing with most activities as does not fully engage core mm.  Pt required intermittent rest breaks during session but overall with good activity tolerance and minimal c/o pain.  Pt will continue to benefit from skilled therapy to progress towards goals.   EVAL: Patient is a 53 y.o. female who was seen today for physical therapy evaluation and treatment for M54.32 (ICD-10-CM) - Left sided sciatica.  Patient demonstrates muscle weakness, reduced ROM, and fascial restrictions which are likely contributing to symptoms of pain and are negatively impacting patient ability to perform ADLs and functional mobility tasks. Patient will benefit from skilled physical therapy services to address these deficits to reduce pain and improve level of function with ADLs and functional mobility tasks.   OBJECTIVE IMPAIRMENTS: Abnormal gait, decreased activity tolerance, decreased mobility, difficulty walking, decreased ROM, decreased strength, impaired perceived functional ability, and pain.   ACTIVITY LIMITATIONS: carrying, lifting, bending, standing, squatting, sleeping, and locomotion level  PARTICIPATION LIMITATIONS: meal prep, cleaning, laundry, shopping, community activity, and occupation   REHAB POTENTIAL: Good  CLINICAL DECISION MAKING: Evolving/moderate complexity  EVALUATION COMPLEXITY: Moderate   GOALS: Goals reviewed with patient? Yes  SHORT TERM GOALS: Target date:  05/18/2024  patient will be independent with initial HEP  Baseline: Goal status: MET  2.  Patient will report 30% improvement overall  Baseline:  Goal status: MET  LONG TERM GOALS: Target date: 06/01/2024  Patient will be independent in self management strategies to improve quality of life and functional outcomes.  Baseline:  Goal status: IN PROGRESS  2.  Patient will report 50% improvement overall  Baseline:  Goal status: MET  3.  Patient will improve Modified Oswestry score by 12 points (20/50 or less) to demonstrate improved perceived function  Baseline: 32/50 Goal status: IN PROGRESS  4.  Patient will improve 5 times sit to stand score to 15 sec or less to demonstrate improved functional mobility and increased leg strength.     Baseline: 23.91 sec Goal status: IN PROGRESS  5.   Patient will increase bilateral leg MMT's to 4+ to 5/5 to allow navigation of steps without gait deviation or loss of balance  Baseline: see above Goal status: IN PROGRESS   PLAN:  PT FREQUENCY: 2x/week  PT DURATION: 4 weeks  PLANNED INTERVENTIONS: 97164- PT Re-evaluation, 97110-Therapeutic exercises, 97530- Therapeutic activity, [id number]- Neuromuscular re-education, 97535- Self Care, 02859- Manual therapy, [id number]- Gait training, [id number]- Orthotic Fit/training, [id number]- Canalith repositioning, [id number]- Aquatic  Therapy, 97760- Splinting, 02402- Wound care (first 20 sq cm), 97598- Wound care (each additional 20 sq cm)Patient/Family education, Balance training, Stair training, Taping, Dry Needling, Joint mobilization, Joint manipulation, Spinal manipulation, Spinal mobilization, Scar mobilization, and DME instructions. SABRA  PLAN FOR NEXT SESSION: Progress functional, core and postural strengthening. Continue 2X week for 4 more weeks.  [name], PTA/CLT [facility] Ph: [phone]  10:57 AM, 06/06/24

## 2024-06-07 ENCOUNTER — Ambulatory Visit (INDEPENDENT_AMBULATORY_CARE_PROVIDER_SITE_OTHER): Admitting: Orthopaedic Surgery

## 2024-06-07 ENCOUNTER — Encounter: Payer: Self-pay | Admitting: Orthopaedic Surgery

## 2024-06-07 VITALS — BP 128/74 | HR 66 | Ht 59.0 in | Wt 170.0 lb

## 2024-06-07 DIAGNOSIS — G8929 Other chronic pain: Secondary | ICD-10-CM

## 2024-06-07 DIAGNOSIS — N3942 Incontinence without sensory awareness: Secondary | ICD-10-CM | POA: Diagnosis not present

## 2024-06-07 DIAGNOSIS — F17210 Nicotine dependence, cigarettes, uncomplicated: Secondary | ICD-10-CM | POA: Diagnosis not present

## 2024-06-07 DIAGNOSIS — M5442 Lumbago with sciatica, left side: Secondary | ICD-10-CM

## 2024-06-07 MED ORDER — HYDROCODONE-ACETAMINOPHEN 5-325 MG PO TABS: ORAL_TABLET | ORAL | 0 refills | Status: DC

## 2024-06-07 NOTE — Patient Instructions (Signed)
Central Scheduling [phone]  While we are working on your approval please go ahead and call to schedule your appointment to be done within one week. Once I have gotten the imaging approved for the facility you choose, I can't change it. [place] is usually booked out for several weeks with imaging appointments.   

## 2024-06-07 NOTE — Addendum Note (Signed)
 Addended by: VICENTA EMMIE HERO on: 06/07/2024 01:31 PM   Modules accepted: Orders

## 2024-06-07 NOTE — Progress Notes (Signed)
 I am worse.  I am leaking urine and I cannot control it.  She has developed incontinency of her urine at times in the last week or so.  She has lower back pain that is worse.  She has left sided sciatica that goes past the left knee.  She has no weakness but has more pain. She says she just leaks at times and she cannot help it.  She is getting adult diapers.  She is very concerned.  Lower back is diffusely tender, ROM is good, NV intact, muscle tone and strength normal, gait normal.  She has been to PT for her back and it has not helped.  I would like to get MRI of the lumbar spine because of her incontinency.  Encounter Diagnoses  Name Primary?   Chronic left-sided low back pain with left-sided sciatica Yes   Nicotine dependence, cigarettes, uncomplicated    Urinary incontinence without sensory awareness    Get the MRI.  I have reviewed the [place]  Controlled Substance Reporting System web site prior to prescribing narcotic medicine for this patient.  I have informed the patient I will be retiring from medical practice and from this office on June 07, 2024, today.  The patient has been offered continuing care with Dr. Margrette or Dr. Onesimo of this office.  The patient may choose another provider and the records will be forwarded after proper signature and notification.  Patient understands and agrees.  Return in two weeks.  Call if any problem.  Precautions discussed.  Electronically Signed Lemond Stable, MD 10/2/20251:15 PM

## 2024-06-09 ENCOUNTER — Other Ambulatory Visit: Payer: Self-pay

## 2024-06-09 DIAGNOSIS — M7062 Trochanteric bursitis, left hip: Secondary | ICD-10-CM

## 2024-06-11 ENCOUNTER — Ambulatory Visit (HOSPITAL_COMMUNITY): Admitting: Physical Therapy

## 2024-06-13 ENCOUNTER — Encounter (HOSPITAL_COMMUNITY): Admitting: Physical Therapy

## 2024-06-13 ENCOUNTER — Telehealth (HOSPITAL_COMMUNITY): Payer: Self-pay | Admitting: Physical Therapy

## 2024-06-13 ENCOUNTER — Encounter: Payer: Self-pay | Admitting: Podiatry

## 2024-06-13 ENCOUNTER — Encounter (HOSPITAL_COMMUNITY)

## 2024-06-13 ENCOUNTER — Ambulatory Visit (INDEPENDENT_AMBULATORY_CARE_PROVIDER_SITE_OTHER): Admitting: Podiatry

## 2024-06-13 DIAGNOSIS — D492 Neoplasm of unspecified behavior of bone, soft tissue, and skin: Secondary | ICD-10-CM | POA: Diagnosis not present

## 2024-06-13 NOTE — Progress Notes (Signed)
  Subjective:  Patient ID: Veronica Arnold, female    DOB: [birth year],   MRN: [id number]  Chief Complaint  Patient presents with   Callouses    The one on the side did pretty good.  The one in the middle bothers me.    53 y.o. female presents for follow-up of lesions on the bottom of her feet. Has been doing well for a while but have returned and hoping to have treated again.    . Denies any other pedal complaints. Denies n/v/f/c.   Past Medical History:  Diagnosis Date   High cholesterol    Hypertension     Objective:  Physical Exam: Vascular: DP/PT pulses 2/4 bilateral. CFT <3 seconds. Normal hair growth on digits. No edema.  Skin. No lacerations or abrasions bilateral feet. Hyperkeratotic cored lesions x 2 noted to right lateral fifth metatarsal head and sub fourth metatarsal head with disruption of skin lines.  Musculoskeletal: MMT 5/5 bilateral lower extremities in DF, PF, Inversion and Eversion. Deceased ROM in DF of ankle joint.  Neurological: Sensation intact to light touch.   Assessment:   1. Skin neoplasm       Plan:  Patient was evaluated and treated and all questions answered. -Discussed benign skin lesions with patient and treatment options.  -Hyperkeratotic tissue was debrided with chisel without incident as courtesy  -Applied salycylic acid treatment to area with dressing. Advised to remove bandaging tomorrow.  -Encouraged daily moisturizing -Discussed use of pumice stone -Advised good supportive shoes and inserts -Patient to return to office as needed or sooner if condition worsens.   Asberry Failing, DPM

## 2024-06-13 NOTE — Telephone Encounter (Signed)
 Pt returned to MD c/o bladder incontinence with referral for MRI.  Spoke to evaluating therapist who suggested to hold until after her MRI and results.  Explained to pt who was agreeable to this and informed we would need a return referral from MD.    [name], PTA/CLT [facility] Ph: [phone]

## 2024-06-14 ENCOUNTER — Other Ambulatory Visit: Payer: Self-pay

## 2024-06-14 DIAGNOSIS — I10 Essential (primary) hypertension: Secondary | ICD-10-CM

## 2024-06-15 ENCOUNTER — Encounter (HOSPITAL_COMMUNITY): Payer: Self-pay

## 2024-06-15 ENCOUNTER — Encounter (HOSPITAL_COMMUNITY)

## 2024-06-18 ENCOUNTER — Other Ambulatory Visit: Payer: Self-pay

## 2024-06-18 ENCOUNTER — Telehealth: Payer: Self-pay

## 2024-06-18 NOTE — Telephone Encounter (Signed)
 Copied from CRM [id number]. Topic: Clinical - Prescription Issue >> Jun 18, 2024 12:47 PM Veronica Arnold wrote: Reason for CRM: pt will like a prescription for pain to hold over until her appt on 1/24  Pls follow up with pt

## 2024-06-19 ENCOUNTER — Other Ambulatory Visit: Payer: Self-pay

## 2024-06-19 DIAGNOSIS — M7062 Trochanteric bursitis, left hip: Secondary | ICD-10-CM

## 2024-06-19 DIAGNOSIS — G8929 Other chronic pain: Secondary | ICD-10-CM

## 2024-06-19 MED ORDER — HYDROCODONE-ACETAMINOPHEN 5-325 MG PO TABS: 1.0000 | ORAL_TABLET | ORAL | 0 refills | Status: AC | PRN

## 2024-06-21 ENCOUNTER — Ambulatory Visit (HOSPITAL_COMMUNITY)
Admission: RE | Admit: 2024-06-21 | Discharge: 2024-06-21 | Disposition: A | Discharge status: Home / Self Care | Source: Ambulatory Visit | Attending: Orthopaedic Surgery | Admitting: Orthopaedic Surgery

## 2024-06-21 DIAGNOSIS — N3942 Incontinence without sensory awareness: Secondary | ICD-10-CM | POA: Diagnosis present

## 2024-06-21 DIAGNOSIS — M47816 Spondylosis without myelopathy or radiculopathy, lumbar region: Secondary | ICD-10-CM | POA: Diagnosis not present

## 2024-06-21 DIAGNOSIS — M48061 Spinal stenosis, lumbar region without neurogenic claudication: Secondary | ICD-10-CM

## 2024-06-21 DIAGNOSIS — G8929 Other chronic pain: Secondary | ICD-10-CM | POA: Diagnosis present

## 2024-06-21 DIAGNOSIS — M51369 Other intervertebral disc degeneration, lumbar region without mention of lumbar back pain or lower extremity pain: Secondary | ICD-10-CM

## 2024-06-21 DIAGNOSIS — F17210 Nicotine dependence, cigarettes, uncomplicated: Secondary | ICD-10-CM | POA: Diagnosis present

## 2024-06-21 DIAGNOSIS — M5442 Lumbago with sciatica, left side: Secondary | ICD-10-CM | POA: Diagnosis present

## 2024-06-26 ENCOUNTER — Telehealth (HOSPITAL_COMMUNITY): Payer: Self-pay

## 2024-06-26 ENCOUNTER — Encounter (HOSPITAL_COMMUNITY)

## 2024-06-26 NOTE — Telephone Encounter (Signed)
 No show; called but unable to leave a message as voicemail box not set up.  11:55 AM, 06/26/24 Geraldo Haris Small Syniah Berne MPT  physical therapy [place] [phone]

## 2024-06-28 ENCOUNTER — Telehealth: Payer: Self-pay

## 2024-06-28 NOTE — Telephone Encounter (Signed)
 Patient left message wanting a refill of her pain medication. I returned her call and could not leave a message her vm isn't set up.

## 2024-06-29 ENCOUNTER — Ambulatory Visit (HOSPITAL_COMMUNITY)

## 2024-06-29 ENCOUNTER — Encounter (HOSPITAL_COMMUNITY): Payer: Self-pay

## 2024-06-29 ENCOUNTER — Ambulatory Visit: Payer: Self-pay

## 2024-06-29 DIAGNOSIS — R262 Difficulty in walking, not elsewhere classified: Secondary | ICD-10-CM

## 2024-06-29 DIAGNOSIS — M5442 Lumbago with sciatica, left side: Secondary | ICD-10-CM | POA: Diagnosis not present

## 2024-06-29 DIAGNOSIS — M545 Low back pain, unspecified: Secondary | ICD-10-CM

## 2024-06-29 DIAGNOSIS — R29898 Other symptoms and signs involving the musculoskeletal system: Secondary | ICD-10-CM

## 2024-06-29 NOTE — Therapy (Signed)
 OUTPATIENT PHYSICAL THERAPY THORACOLUMBAR TREATMENT    Patient Name: CHEVY VIRGO MRN: [id number] DOB:[birth year], 53 y.o., female Today's Date: 06/29/2024  END OF SESSION:  PT End of Session - 06/29/24 0900     Visit Number 9    Number of Visits 14    Date for Recertification  06/28/24    Authorization Type [employer] CARITAS NEXT    Authorization Time Period [employer] approved 8 more visits 06/02/24-08/03/24    Authorization - Visit Number 3    Authorization - Number of Visits 8    Progress Note Due on Visit 14    PT Start Time 0900    PT Stop Time 0925    PT Time Calculation (min) 25 min    Activity Tolerance Patient limited by pain;Patient limited by fatigue    Behavior During Therapy [facility] for tasks assessed/performed              Past Medical History:  Diagnosis Date   High cholesterol    Hypertension    Past Surgical History:  Procedure Laterality Date   HERNIA REPAIR     TUBAL LIGATION     TUBAL LIGATION     Patient Active Problem List   Diagnosis Date Noted   Chronic left-sided low back pain with left-sided sciatica 01/24/2024   Greater trochanteric bursitis of left hip 12/28/2023   Bilateral hand pain 10/04/2023   Right foot pain 04/26/2023   Wheezing 01/26/2023   Prediabetes 12/02/2022   Vitamin D  deficiency 12/02/2022   Colon cancer screening 12/02/2022   Breast cancer screening by mammogram 12/02/2022   Need for Tdap vaccination 12/02/2022   Encounter for general adult medical examination with abnormal findings 07/19/2022   Essential hypertension 11/30/2015   Hyperlipidemia 11/30/2015   AP (abdominal pain) 11/30/2015   Cigarette nicotine dependence without complication 11/30/2015    PCP: Bevely Doffing FNP  REFERRING PROVIDER: Brenna Lin, MD  REFERRING DIAG: [phone] (ICD-10-CM) - Left sided sciatica  Rationale for Evaluation and Treatment: Rehabilitation  THERAPY DIAG:  Low back pain with left-sided sciatica, unspecified back  pain laterality, unspecified chronicity  Difficulty in walking, not elsewhere classified  Low back pain, unspecified back pain laterality, unspecified chronicity, unspecified whether sciatica present  Left leg weakness  ONSET DATE: 6 months or so  SUBJECTIVE:                                                                                                                                                                                           SUBJECTIVE STATEMENT: Patient reports that she still feels about the same since her last appointment. She thinks that the  pain may be starting in her right leg. She had a MRI 06/21/24 on her lumbar spine. She still has numbness in her left low leg.   EVAL: Pain started about 6 months ago.  Insidious onset; couldn't get up on morning.  Pain from left low back and hip all the way down to her left foot; lower leg feels wet.  Saw PCP first and then she sent to [name].  Had x-rays done of hip and back and both showed arthritis.  Gave Prednisone ; did not help; [place] referred to therapy.  Has also had injections and those did not help.    PERTINENT HISTORY:  Bilateral hand pain  PAIN:  Are you having pain? Yes: NPRS scale: 7/10 Pain location: left side back and down left leg Pain description: feels wet Aggravating factors: standing too long, walking too long Relieving factors: sit, rest, meloxicam   PRECAUTIONS: None  WEIGHT BEARING RESTRICTIONS: No  FALLS:  Has patient fallen in last 6 months? No   OCCUPATION: CNA  PLOF: Independent  PATIENT GOALS: make me walk right  NEXT MD VISIT: PRN  OBJECTIVE:  Note: Objective measures were completed at Evaluation unless otherwise noted.  DIAGNOSTIC FINDINGS:  Clinical:  lower back pain with left sided sciatica, no trauma   X-rays were done of the lumbar spine, five views.   There is normal lumbar lordosis present of the lumbar spine.  There are small anterior osteophytes present L3  and L4.  Disc spaces are maintained.  No fracture is noted.  Bone quality is good.   Impression:  mild degenerative changes of the lumbar spine, no acute findings.   Electronically Signed Lemond Brenna, MD 7/16/20259:40 AM  CLINICAL DATA:  Left hip pain.   EXAM: DG HIP (WITH OR WITHOUT PELVIS) 2-3V LEFT   COMPARISON:  None Available.   FINDINGS: Mild bilateral sacroiliac subchondral sclerosis. Minimal bilateral superior femoroacetabular joint space narrowing. Mild bilateral superolateral left greater than right acetabular degenerative osteophytosis.   There are left greater than right superior lateral femoral head-neck junction likely degenerative subchondral cysts, measuring up to 13 mm on the left.   Minimal superior pubic symphysis degenerative spurring.   No acute fracture or dislocation.   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    PATIENT SURVEYS:  Modified Oswestry:  MODIFIED OSWESTRY DISABILITY SCALE   Date: 04/24/24 Score 06/29/24  Total 32/50; 64% 76% disability   Interpretation of scores: Score Category Description  0-20% Minimal Disability The patient can cope with most living activities. Usually no treatment is indicated apart from advice on lifting, sitting and exercise  21-40% Moderate Disability The patient experiences more pain and difficulty with sitting, lifting and standing. Travel and social life are more difficult and they may be disabled from work. Personal care, sexual activity and sleeping are not grossly affected, and the patient can usually be managed by conservative means  41-60% Severe Disability Pain remains the main problem in this group, but activities of daily living are affected. These patients require a detailed investigation  61-80% Crippled Back pain impinges on all aspects of the patient's life. Positive intervention is required  81-100% Bed-bound  These patients are either bed-bound or exaggerating  their symptoms  [name], et al. Surgery versus conservative management of stable thoracolumbar fracture: the PRESTO feasibility RCT. [place] ([place]): [employer]; 2021 Nov. [facility] Technology Assessment, No. 25.62.) Appendix 3, Oswestry Disability Index category descriptors. Available from: FindJewel[employer]cz  Minimally Clinically Important  Difference (MCID) = 12.8%  COGNITION: Overall cognitive status: Within functional limits for tasks assessed     SENSATION: Decreased sensation left lower leg and foot to light touch  POSTURE: rounded shoulders, forward head, and increased lumbar lordosis  PALPATION: Very tender on palpation to lower lumber spine and left hip and glute  LUMBAR ROM: * pain  AROM eval 05/30/24  Flexion Fingertips to distal pole of patella * Touches toes WNL No pain  Extension 30% available* WNL No pain  Right lateral flexion Fingertips to 2 above knee joint line To knee No pain  Left lateral flexion 2 above knee joint line* To knee No pain  Right rotation    Left rotation     (Blank rows = not tested)  LOWER EXTREMITY MMT:    MMT Right eval Left eval Right 05/30/24 Left 05/30/24 Right 06/29/24 Left 06/29/24  Hip flexion 4+ 3- 5 3- 3+/5 3/5  Hip extension 4- 2+ 3- prone 3- prone    Hip abduction   5 4    Hip adduction        Hip internal rotation        Hip external rotation        Knee flexion   5 3- 4-/5 3/5  Knee extension 5 3+ 5 4 3+/5 3/5; painful   Ankle dorsiflexion 5 4 5  4+ 3+/5 3/5  Ankle plantarflexion        Ankle inversion        Ankle eversion         (Blank rows = not tested)  Hamstring Length RLE: 165 deg LLE: Unable to assess, pt really guarded and reports increased pain even to light touch    FUNCTIONAL TESTS:  5 times sit to stand:  23.91 sec using hands to assist up to standing   05/30/24 PROGRESS NOTE: Modified oswestry:  23 / 50 = 46.0 % (was 32/50; 64%) 5  times sit to stand: 05/30/24: 16.06 no UE assist (was 23.91 sec using hands to assist up to standing) : 226 feet with SPC  GAIT: Distance walked: 50 ft in clinic Assistive device utilized: Single point cane Level of assistance: Modified independence Comments: slight antalgic gait  TREATMENT DATE:                                    06/29/24 EXERCISE LOG  Exercise Repetitions and Resistance Comments  Goal assessment  See below for progress note    Patient education  See below   Nustep  L4 x 5 minutes     Blank cell = exercise not performed today   06/06/24 Nustep  UE/LE seat 6 x 5' average level 3 heelraises 20X  Hip abduction 2X10 each LE  Hip extension 2X10 each LE  High marching 2X10 each LE  RTB rows 2X10  RTB shoulder extensions 2X10  RTB pallof presses 2X10 each  06/04/24: Standing: in // bars with bil UE assist heelraises 20X  Hip abduction 2X10 each LE  Hip extension 2X10 each LE  High marching 2X10 each LE  RTB rows 2X10  RTB shoulder extensions 2X10  RTB pallof presses 2X10 each Sit to stands no UE 2X10 Nustep  UE/LE seat 6 x 5' average level 3  05/30/24 PROGRESS NOTE: Modified oswestry:  23 / 50 = 46.0 % (was 32/50; 64%) 5 times sit to stand: 05/30/24: 16.06 no UE assist (was 23.91 sec  using hands to assist up to standing) Lumbar ROM (see above chart) MMT (see above chart) : 226 feet with SPC Goal review Theraband exercises:  rows, shoulder extension RTB 2X10 Nustep [place] UE/LE seat 6 x 5' average SPM 68    PATIENT EDUCATION:  Education details: Patient educated on exam findings, POC, prognosis, anatomy, and MRI results Person educated: Patient Education method: Explanation Education comprehension: verbalized understanding   HOME EXERCISE PROGRAM: Access Code: [email or web address] URL: [email or web address] Date: 05/21/2024 Prepared by: Augustin Mclean Exercises - Hooklying Isometric Hip Abduction Adduction with Belt and Ball   - 2 x daily - 7 x weekly - 1 sets - 10 reps - 5 hold - Supine Transversus Abdominis Bracing - Hands on Stomach  - 2 x daily - 7 x weekly - 2 sets - 10 reps - 5 hold - Correct Seated Posture  - 5 x daily - 7 x weekly - 1 sets - 1 reps - Seated Scapular Retraction  - 2 x daily - 7 x weekly - 2 sets - 10 reps - 5 hold   Access Code: [email or web address] URL: [email or web address] Date: 05/23/2024 Prepared by: Greig Fuse - Seated Piriformis Stretch with Trunk Bend  - 2 x daily - 7 x weekly - 2 sets - 3 reps - 30 sec hold - Sit to Stand  - 2 x daily - 7 x weekly - 2 sets - 10 reps - Standing Bilateral Low Shoulder Row with Anchored Resistance  - 2 x daily - 7 x weekly - 2 sets - 10 reps - Shoulder Extension with Resistance  - 2 x daily - 7 x weekly - 2 sets - 10 reps  05/29/24 Decompression exercises 1-5  05/30/24:  theraband rows and shoulder extension 2X10 each day red band  ASSESSMENT:  CLINICAL IMPRESSION: Patient appears to have regressed slightly since her last progress report on 05/30/24. This is evidenced by her objective measures, ODI score, and functional mobility. This lack of progress could potentially be attributed due to her inability to attend physical therapy since 06/06/24. She exhibited reduced lower extremity muscular strength as evidenced by her lower extremity manual muscle testing. She also feels that she may be beginning to experience symptoms in her right lower extremity. She was educated on her lumbar anatomy and MRI results. She reported understanding. She requested to complete today's treatment early due to pain and fatigue. Recommend that she continue with skilled physical therapy to address her remaining impairments to maximize her functional mobility.    EVAL: Patient is a 53 y.o. female who was seen today for physical therapy evaluation and treatment for M54.32 (ICD-10-CM) - Left sided sciatica.  Patient demonstrates muscle weakness, reduced ROM, and fascial  restrictions which are likely contributing to symptoms of pain and are negatively impacting patient ability to perform ADLs and functional mobility tasks. Patient will benefit from skilled physical therapy services to address these deficits to reduce pain and improve level of function with ADLs and functional mobility tasks.   OBJECTIVE IMPAIRMENTS: Abnormal gait, decreased activity tolerance, decreased mobility, difficulty walking, decreased ROM, decreased strength, impaired perceived functional ability, and pain.   ACTIVITY LIMITATIONS: carrying, lifting, bending, standing, squatting, sleeping, and locomotion level  PARTICIPATION LIMITATIONS: meal prep, cleaning, laundry, shopping, community activity, and occupation   REHAB POTENTIAL: Good  CLINICAL DECISION MAKING: Evolving/moderate complexity  EVALUATION COMPLEXITY: Moderate   GOALS: Goals reviewed with patient? Yes  SHORT TERM GOALS: Target date: 05/18/2024  patient will be independent with initial HEP  Baseline: Goal status: MET  2.  Patient will report 30% improvement overall  Baseline:  Goal status: MET  LONG TERM GOALS: Target date: 06/01/2024  Patient will be independent in self management strategies to improve quality of life and functional outcomes.  Baseline:  Goal status: IN PROGRESS  2.  Patient will report 50% improvement overall  Baseline:  Goal status: MET  3.  Patient will improve Modified Oswestry score by 12 points (20/50 or less) to demonstrate improved perceived function  Baseline: 38/50 Goal status: IN PROGRESS  4.  Patient will improve 5 times sit to stand score to 15 sec or less to demonstrate improved functional mobility and increased leg strength.     Baseline: 23.64 sec Goal status: IN PROGRESS  5.   Patient will increase bilateral leg MMT's to 4+ to 5/5 to allow navigation of steps without gait deviation or loss of balance  Baseline: see above Goal status: IN  PROGRESS   PLAN:  PT FREQUENCY: 2x/week  PT DURATION: 4 weeks  PLANNED INTERVENTIONS: 97164- PT Re-evaluation, 97110-Therapeutic exercises, 97530- Therapeutic activity, 97112- Neuromuscular re-education, 97535- Self Care, [id number]- Manual therapy, [id number]- Gait training, [phone]- Orthotic Fit/training, [phone]- Canalith repositioning, [id number]- Aquatic Therapy, 97760- Splinting, [id number]- Wound care (first 20 sq cm), 97598- Wound care (each additional 20 sq cm)Patient/Family education, Balance training, Stair training, Taping, Dry Needling, Joint mobilization, Joint manipulation, Spinal manipulation, Spinal mobilization, Scar mobilization, and DME instructions. SABRA  PLAN FOR NEXT SESSION: Progress functional, core and postural strengthening. Continue 2X week for 4 more weeks.  Lacinda Fass, PT, DPT  9:36 AM, 06/29/24

## 2024-07-02 ENCOUNTER — Ambulatory Visit: Payer: Self-pay | Admitting: Family Medicine

## 2024-07-02 ENCOUNTER — Ambulatory Visit: Payer: Self-pay

## 2024-07-02 ENCOUNTER — Telehealth: Payer: Self-pay

## 2024-07-02 NOTE — Telephone Encounter (Signed)
 Copied from CRM [id number]. Topic: Clinical - Medical Advice >> Jul 02, 2024 11:21 AM Donee H wrote: Reason for CRM: Patient states contacted pain management within 5-7 days as instructed by pcp. Patient was placed on waitlist due to know available openings currently. She is asking if something can be prescribed by pcp for back pain.  Please follow up with patient on request

## 2024-07-02 NOTE — Telephone Encounter (Signed)
 See nurse triage encounter for today addressing patient's back pain.

## 2024-07-02 NOTE — Telephone Encounter (Signed)
Noted appointment made

## 2024-07-02 NOTE — Telephone Encounter (Signed)
 FYI Only or Action Required?: FYI only for provider.  Patient was last seen in primary care on 05/08/2024 by Bevely Doffing, FNP.  Called Nurse Triage reporting Back Pain.  Symptoms began today.  Interventions attempted: Prescription medications: Meloxicam  and Pregabalin .  Symptoms are: unchanged.  Triage Disposition: See HCP Within 4 Hours (Or PCP Triage)  Patient/caregiver understands and will follow disposition?: Yes     Copied from CRM #[id number]. Topic: Clinical - Red Word Triage >> Jul 02, 2024  1:59 PM Corin V wrote: [facility] that prompted transfer to Nurse Triage: Patient is having severe pain and was unable to get out of bed this morning. She is taking meds as prescribed but feels she is back to the start of her care with how pain feels unmanaged.      Reason for Disposition  [1] SEVERE back pain (e.g., excruciating, unable to do any normal activities) AND [2] not improved 2 hours after pain medicine  Answer Assessment - Initial Assessment Questions 1. ONSET: When did the pain begin? (e.g., minutes, hours, days)     This morning  2. LOCATION: Where does it hurt? (upper, mid or lower back)     Lower back  3. SEVERITY: How bad is the pain?  (e.g., Scale 1-10; mild, moderate, or severe)     Moderate to severe  4. PATTERN: Is the pain constant? (e.g., yes, no; constant, intermittent)      Constant  5. RADIATION: Does the pain shoot into your legs or somewhere else?     Left hip and left leg  6. CAUSE:  What do you think is causing the back pain?      History of back pain  7. BACK OVERUSE:  Any recent lifting of heavy objects, strenuous work or exercise?     No 8. MEDICINES: What have you taken so far for the pain? (e.g., nothing, acetaminophen , NSAIDS)     Meloxicam  and Pregabalin   9. NEUROLOGIC SYMPTOMS: Do you have any weakness, numbness, or problems with bowel/bladder control?     No 10. OTHER SYMPTOMS: Do you have any other symptoms? (e.g.,  fever, abdomen pain, burning with urination, blood in urine)       No  Protocols used: Back Pain-A-AH

## 2024-07-03 ENCOUNTER — Ambulatory Visit (INDEPENDENT_AMBULATORY_CARE_PROVIDER_SITE_OTHER): Admitting: Orthopedic Surgery

## 2024-07-03 ENCOUNTER — Telehealth (HOSPITAL_COMMUNITY): Payer: Self-pay | Admitting: Physical Therapy

## 2024-07-03 ENCOUNTER — Encounter: Payer: Self-pay | Admitting: Orthopedic Surgery

## 2024-07-03 ENCOUNTER — Ambulatory Visit (HOSPITAL_COMMUNITY): Admitting: Physical Therapy

## 2024-07-03 VITALS — BP 149/82 | HR 76 | Ht 59.0 in | Wt 170.0 lb

## 2024-07-03 DIAGNOSIS — G8929 Other chronic pain: Secondary | ICD-10-CM

## 2024-07-03 DIAGNOSIS — M5442 Lumbago with sciatica, left side: Secondary | ICD-10-CM | POA: Diagnosis not present

## 2024-07-03 MED ORDER — HYDROCODONE-ACETAMINOPHEN 5-325 MG PO TABS: 1.0000 | ORAL_TABLET | Freq: Four times a day (QID) | ORAL | 0 refills | Status: DC | PRN

## 2024-07-03 MED ORDER — PREDNISONE 10 MG (21) PO TBPK: ORAL_TABLET | ORAL | 0 refills | Status: DC

## 2024-07-03 NOTE — Patient Instructions (Signed)
 Referral to [name] for back pain  Medications sent to pharmacy on file.   No more narcotic medications will be provided

## 2024-07-03 NOTE — Telephone Encounter (Signed)
 Called patient she will keep 11/24 appt and currently seeing Ortho care today

## 2024-07-03 NOTE — Telephone Encounter (Signed)
 Pt came into clinic 5 minutes before scheduled appt.  States her back went out and she's either going to the ED or MD office.  Appt was cancelled by patient  [name], PTA/CLT [facility] Ph: [phone]

## 2024-07-03 NOTE — Telephone Encounter (Signed)
 Copied from CRM [id number]. Topic: Appointments - Scheduling Inquiry for Clinic >> Jul 02, 2024  4:57 PM Wess RAMAN wrote: Reason for CRM: Patient needs appt for medication refill. She stated a virtual appt was scheduled and then she received a call from the office canceling it.  Callback #: [phone]

## 2024-07-04 NOTE — Progress Notes (Signed)
 New Patient Visit  Assessment: Veronica Arnold is a 53 y.o. female with the following: 1. Chronic left-sided low back pain with left-sided sciatica  Plan: Veronica Arnold continues to have severe lower back pain.  Veronica Arnold is tearful in clinic today.  Veronica Arnold is complaining of a lot of pain into her left leg.  Reviewed the MRI in clinic today, which demonstrates moderate foraminal stenosis bilaterally.  I have refilled her pain medication, but advised her that I will not continue to provide this medication.  Veronica Arnold we will need to schedule appointment with pain management, as a referral has already been placed.  I have also provided a short course of prednisone .  In regards to her ongoing back pain, I recommended a referral to [name], for consideration for steroid injections.  Follow-up: Return if symptoms worsen or fail to improve.  Subjective:  Chief Complaint  Patient presents with   Back Pain    Down left leg worse in past 3 days can't sleep tearful today due to pain     History of Present Illness: Veronica Arnold is a 53 y.o. female who presents for evaluation of low back pain.  Veronica Arnold states that Veronica Arnold has had pain in the lower back for the past 4 months.  Veronica Arnold has been followed by [name].  Veronica Arnold has obtained an MRI, and is here to discuss the findings.  Veronica Arnold has been taking narcotic pain medications, which most recently has been provided by her primary care provider.  Prior to that, [name] has provided medications for her.  A referral to pain management has been placed by her PCP.  Veronica Arnold is having severe pain in the low back, with radiating pains into the left leg.   Review of Systems: No fevers or chills No numbness or tingling No chest pain No shortness of breath No bowel or bladder dysfunction No GI distress No headaches   Medical History:  Past Medical History:  Diagnosis Date   High cholesterol    Hypertension     Past Surgical History:  Procedure Laterality Date   HERNIA  REPAIR     TUBAL LIGATION     TUBAL LIGATION      Family History  Problem Relation Age of Onset   [name]    Stroke Brother    Heart attack Brother    Social History   Tobacco Use   Smoking status: Every Day    Current packs/day: 0.50    Average packs/day: 0.5 packs/day for 25.0 years (12.5 ttl pk-yrs)    Types: Cigarettes   Smokeless tobacco: Never  Vaping Use   Vaping status: Never Used  Substance Use Topics   Alcohol use: No    Alcohol/week: 0.0 standard drinks of alcohol   Drug use: No    No Known Allergies  Current Meds  Medication Sig   HYDROcodone -acetaminophen  (NORCO/VICODIN) 5-325 MG tablet Take 1 tablet by mouth every 6 (six) hours as needed.   predniSONE  (STERAPRED UNI-PAK 21 TAB) 10 MG (21) TBPK tablet 10 mg DS 12 as directed    Objective: BP (!) 149/82   Pulse 76   Ht 4' 11 (1.499 m)   Wt 170 lb (77.1 kg)   BMI 34.34 kg/m   Physical Exam:  General: Alert and oriented.  Veronica Arnold is tearful Gait: Ambulates with the assistance of a cane  Tenderness in the low back.  No tenderness over the greater trochanter laterally.  Smooth internal and external rotation of  bilateral hips.  Negative straight leg raise on the right.  Veronica Arnold has some pain with straight leg raise on the left.  Veronica Arnold has a decrease sensation over the anterior lower leg on the left.  Toes are warm and well-perfused  Imaging  I personally reviewed images previously obtained in clinic  MRI lumbar spine  IMPRESSION: 1. Mild degenerative disc disease with disc bulge at L4-5 and moderate bilateral facet arthropathy. There is moderate bilateral neural foraminal stenosis and mild spinal stenosis. 2. Mild degenerative changes at at L3-4 and L5-S1    New Medications:  Meds ordered this encounter  Medications   predniSONE  (STERAPRED UNI-PAK 21 TAB) 10 MG (21) TBPK tablet    Sig: 10 mg DS 12 as directed    Dispense:  48 tablet    Refill:  0   HYDROcodone -acetaminophen  (NORCO/VICODIN)  5-325 MG tablet    Sig: Take 1 tablet by mouth every 6 (six) hours as needed.    Dispense:  30 tablet    Refill:  0      [name], MD  07/04/2024 11:56 AM

## 2024-07-05 ENCOUNTER — Encounter (HOSPITAL_COMMUNITY): Payer: Self-pay

## 2024-07-05 ENCOUNTER — Ambulatory Visit (HOSPITAL_COMMUNITY)

## 2024-07-05 DIAGNOSIS — M545 Low back pain, unspecified: Secondary | ICD-10-CM

## 2024-07-05 DIAGNOSIS — R29898 Other symptoms and signs involving the musculoskeletal system: Secondary | ICD-10-CM

## 2024-07-05 DIAGNOSIS — M5442 Lumbago with sciatica, left side: Secondary | ICD-10-CM

## 2024-07-05 DIAGNOSIS — R262 Difficulty in walking, not elsewhere classified: Secondary | ICD-10-CM

## 2024-07-05 NOTE — Therapy (Signed)
 OUTPATIENT PHYSICAL THERAPY THORACOLUMBAR TREATMENT    Patient Name: Veronica Arnold MRN: [id number] DOB:[birth year], 53 y.o., female Today's Date: 07/05/2024  END OF SESSION:  PT End of Session - 07/05/24 0957     Visit Number 10    Number of Visits 14    Date for Recertification  08/03/24    Authorization Type [employer] CARIT[employer] NEXT    Authorization Time Period [employer] approved 8 more visits 06/02/24-08/03/24    Authorization - Visit Number 4    Authorization - Number of Visits 8    Progress Note Due on Visit 8    PT Start Time [phone]    PT Stop Time 1030    PT Time Calculation (min) 38 min    Activity Tolerance Patient limited by pain;Patient limited by fatigue    Behavior During Therapy [facility] for tasks assessed/performed               Past Medical History:  Diagnosis Date   High cholesterol    Hypertension    Past Surgical History:  Procedure Laterality Date   HERNIA REPAIR     TUBAL LIGATION     TUBAL LIGATION     Patient Active Problem List   Diagnosis Date Noted   Chronic left-sided low back pain with left-sided sciatica 01/24/2024   Greater trochanteric bursitis of left hip 12/28/2023   Bilateral hand pain 10/04/2023   Right foot pain 04/26/2023   Wheezing 01/26/2023   Prediabetes 12/02/2022   Vitamin D  deficiency 12/02/2022   Colon cancer screening 12/02/2022   Breast cancer screening by mammogram 12/02/2022   Need for Tdap vaccination 12/02/2022   Encounter for general adult medical examination with abnormal findings 07/19/2022   Essential hypertension 11/30/2015   Hyperlipidemia 11/30/2015   AP (abdominal pain) 11/30/2015   Cigarette nicotine dependence without complication 11/30/2015    PCP: Bevely Doffing FNP  REFERRING PROVIDER: Brenna Lin, MD  REFERRING DIAG: [phone] (ICD-10-CM) - Left sided sciatica  Rationale for Evaluation and Treatment: Rehabilitation  THERAPY DIAG:  Low back pain with left-sided sciatica, unspecified back  pain laterality, unspecified chronicity  Difficulty in walking, not elsewhere classified  Low back pain, unspecified back pain laterality, unspecified chronicity, unspecified whether sciatica present  Left leg weakness  ONSET DATE: 6 months or so  SUBJECTIVE:                                                                                                                                                                                           SUBJECTIVE STATEMENT: Pt states she is feeling a little better since last session, pt state she did have  a set back this weekend, reports her back went out on Sunday morning. Pt states she is in 8/10 pain this morning.   EVAL: Pain started about 6 months ago.  Insidious onset; couldn't get up on morning.  Pain from left low back and hip all the way down to her left foot; lower leg feels wet.  Saw PCP first and then she sent to [name].  Had x-rays done of hip and back and both showed arthritis.  Gave Prednisone ; did not help; [place] referred to therapy.  Has also had injections and those did not help.    PERTINENT HISTORY:  Bilateral hand pain  PAIN:  Are you having pain? Yes: NPRS scale: 7/10 Pain location: left side back and down left leg Pain description: feels wet Aggravating factors: standing too long, walking too long Relieving factors: sit, rest, meloxicam   PRECAUTIONS: None  WEIGHT BEARING RESTRICTIONS: No  FALLS:  Has patient fallen in last 6 months? No   OCCUPATION: CNA  PLOF: Independent  PATIENT GOALS: make me walk right  NEXT MD VISIT: PRN  OBJECTIVE:  Note: Objective measures were completed at Evaluation unless otherwise noted.  DIAGNOSTIC FINDINGS:  Clinical:  lower back pain with left sided sciatica, no trauma   X-rays were done of the lumbar spine, five views.   There is normal lumbar lordosis present of the lumbar spine.  There are small anterior osteophytes present L3 and L4.  Disc spaces are  maintained.  No fracture is noted.  Bone quality is good.   Impression:  mild degenerative changes of the lumbar spine, no acute findings.   Electronically Signed Lemond Brenna, MD 7/16/20259:40 AM  CLINICAL DATA:  Left hip pain.   EXAM: DG HIP (WITH OR WITHOUT PELVIS) 2-3V LEFT   COMPARISON:  None Available.   FINDINGS: Mild bilateral sacroiliac subchondral sclerosis. Minimal bilateral superior femoroacetabular joint space narrowing. Mild bilateral superolateral left greater than right acetabular degenerative osteophytosis.   There are left greater than right superior lateral femoral head-neck junction likely degenerative subchondral cysts, measuring up to 13 mm on the left.   Minimal superior pubic symphysis degenerative spurring.   No acute fracture or dislocation.   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    PATIENT SURVEYS:  Modified Oswestry:  MODIFIED OSWESTRY DISABILITY SCALE   Date: 04/24/24 Score 06/29/24  Total 32/50; 64% 76% disability   Interpretation of scores: Score Category Description  0-20% Minimal Disability The patient can cope with most living activities. Usually no treatment is indicated apart from advice on lifting, sitting and exercise  21-40% Moderate Disability The patient experiences more pain and difficulty with sitting, lifting and standing. Travel and social life are more difficult and they may be disabled from work. Personal care, sexual activity and sleeping are not grossly affected, and the patient can usually be managed by conservative means  41-60% Severe Disability Pain remains the main problem in this group, but activities of daily living are affected. These patients require a detailed investigation  61-80% Crippled Back pain impinges on all aspects of the patient's life. Positive intervention is required  81-100% Bed-bound  These patients are either bed-bound or exaggerating their symptoms  [name], et al. Surgery versus conservative management of stable thoracolumbar fracture: the PRESTO feasibility RCT. [place] ([place]): [employer]; 2021 Nov. [facility] Technology Assessment, No. 25.62.) Appendix 3, Oswestry Disability Index category descriptors. Available from: Findjewelers.cz  Minimally Clinically Important Difference (MCID) = 12.8%  COGNITION: Overall cognitive status: Within functional limits for tasks assessed     SENSATION: Decreased sensation left lower leg and foot to light touch  POSTURE: rounded shoulders, forward head, and increased lumbar lordosis  PALPATION: Very tender on palpation to lower lumber spine and left hip and glute  LUMBAR ROM: * pain  AROM eval 05/30/24  Flexion Fingertips to distal pole of patella * Touches toes WNL No pain  Extension 30% available* WNL No pain  Right lateral flexion Fingertips to 2 above knee joint line To knee No pain  Left lateral flexion 2 above knee joint line* To knee No pain  Right rotation    Left rotation     (Blank rows = not tested)  LOWER EXTREMITY MMT:    MMT Right eval Left eval Right 05/30/24 Left 05/30/24 Right 06/29/24 Left 06/29/24  Hip flexion 4+ 3- 5 3- 3+/5 3/5  Hip extension 4- 2+ 3- prone 3- prone    Hip abduction   5 4    Hip adduction        Hip internal rotation        Hip external rotation        Knee flexion   5 3- 4-/5 3/5  Knee extension 5 3+ 5 4 3+/5 3/5; painful   Ankle dorsiflexion 5 4 5  4+ 3+/5 3/5  Ankle plantarflexion        Ankle inversion        Ankle eversion         (Blank rows = not tested)  Hamstring Length RLE: 165 deg LLE: Unable to assess, pt really guarded and reports increased pain even to light touch    FUNCTIONAL TESTS:  5 times sit to stand:  23.91 sec using hands to assist up to standing   05/30/24 PROGRESS NOTE: Modified oswestry:  23 / 50 = 46.0 % (was 32/50; 64%) 5 times sit to stand:  05/30/24: 16.06 no UE assist (was 23.91 sec using hands to assist up to standing) : 226 feet with SPC  GAIT: Distance walked: 50 ft in clinic Assistive device utilized: Single point cane Level of assistance: Modified independence Comments: slight antalgic gait  TREATMENT DATE:  07/05/2024  Therapeutic Exercise: -Modified crunch, 2 sets of 10 reps, 3 second holds, pt cued for set up and sequencing, and appropriate ROM -Side plank from knees, 1 sets of 3 reps, 20 seconds,  bilateral, pt cued for sequencing -Bird dogs, 2 sets of 5 reps bilaterally, pt cued for increased ROM of LE and neutral spine -Lateral stepping 1 laps 10 steps per lap, with RTB around ankles, pt cued for upright posture and sequencing -Monster walks, 1 lap of 10 steps, with RTB at ankles, pt cued for proper sequencing -Nustep, level 3 resistance, 5 minutes, pt cued for 70 spm Neuromuscular Re-education: -DKTC on green exercise ball, 2 sets of 10 reps, pt cued to remain in pain free ROM -LTR 2 set of 10 reps bilaterally, pt cued to remain in pain free ROM                                   06/29/24 EXERCISE LOG  Exercise Repetitions and Resistance Comments  Goal assessment  See below for progress note    Patient education  See below   Nustep  L4 x 5 minutes     Blank cell = exercise not performed today   06/06/24  Nustep  UE/LE seat 6 x 5' average level 3 heelraises 20X  Hip abduction 2X10 each LE  Hip extension 2X10 each LE  High marching 2X10 each LE  RTB rows 2X10  RTB shoulder extensions 2X10  RTB pallof presses 2X10 each   PATIENT EDUCATION:  Education details: Patient educated on exam findings, POC, prognosis, anatomy, and MRI results Person educated: Patient Education method: Explanation Education comprehension: verbalized understanding   HOME EXERCISE PROGRAM: Access Code: [email or web address] URL: [email or web address] Date: 05/21/2024 Prepared by: Augustin Mclean Exercises - Hooklying  Isometric Hip Abduction Adduction with Belt and Ball  - 2 x daily - 7 x weekly - 1 sets - 10 reps - 5 hold - Supine Transversus Abdominis Bracing - Hands on Stomach  - 2 x daily - 7 x weekly - 2 sets - 10 reps - 5 hold - Correct Seated Posture  - 5 x daily - 7 x weekly - 1 sets - 1 reps - Seated Scapular Retraction  - 2 x daily - 7 x weekly - 2 sets - 10 reps - 5 hold   Access Code: [email or web address] URL: [email or web address] Date: 05/23/2024 Prepared by: Greig Fuse - Seated Piriformis Stretch with Trunk Bend  - 2 x daily - 7 x weekly - 2 sets - 3 reps - 30 sec hold - Sit to Stand  - 2 x daily - 7 x weekly - 2 sets - 10 reps - Standing Bilateral Low Shoulder Row with Anchored Resistance  - 2 x daily - 7 x weekly - 2 sets - 10 reps - Shoulder Extension with Resistance  - 2 x daily - 7 x weekly - 2 sets - 10 reps  05/29/24 Decompression exercises 1-5  05/30/24:  theraband rows and shoulder extension 2X10 each day red band  ASSESSMENT:  CLINICAL IMPRESSION: Patient continues to demonstrate increased low back pain, decreased LE/core strength, decreased gait quality and balance. Patient also demonstrates decreased endurance with increased intensity exercise with SOB noted during today's session. Patient able to progress dynamic balance and core activation exercises today with lateral stepping and side planks, good performance with verbal cueing. Patient would continue to benefit from skilled physical therapy for decreased low back pain, increased endurance with ambulation, increased LE/core strength, and improved balance for improved quality of life, improved independence with gait training and continued progress towards therapy goals.   EVAL: Patient is a 53 y.o. female who was seen today for physical therapy evaluation and treatment for M54.32 (ICD-10-CM) - Left sided sciatica.  Patient demonstrates muscle weakness, reduced ROM, and fascial restrictions which are likely contributing to  symptoms of pain and are negatively impacting patient ability to perform ADLs and functional mobility tasks. Patient will benefit from skilled physical therapy services to address these deficits to reduce pain and improve level of function with ADLs and functional mobility tasks.   OBJECTIVE IMPAIRMENTS: Abnormal gait, decreased activity tolerance, decreased mobility, difficulty walking, decreased ROM, decreased strength, impaired perceived functional ability, and pain.   ACTIVITY LIMITATIONS: carrying, lifting, bending, standing, squatting, sleeping, and locomotion level  PARTICIPATION LIMITATIONS: meal prep, cleaning, laundry, shopping, community activity, and occupation   REHAB POTENTIAL: Good  CLINICAL DECISION MAKING: Evolving/moderate complexity  EVALUATION COMPLEXITY: Moderate   GOALS: Goals reviewed with patient? Yes  SHORT TERM GOALS: Target date: 05/18/2024  patient will be independent with initial HEP  Baseline: Goal status: MET  2.  Patient will report 30% improvement overall  Baseline:  Goal status: MET  LONG TERM GOALS: Target  date: 06/01/2024  Patient will be independent in self management strategies to improve quality of life and functional outcomes.  Baseline:  Goal status: IN PROGRESS  2.  Patient will report 50% improvement overall  Baseline:  Goal status: MET  3.  Patient will improve Modified Oswestry score by 12 points (20/50 or less) to demonstrate improved perceived function  Baseline: 38/50 Goal status: IN PROGRESS  4.  Patient will improve 5 times sit to stand score to 15 sec or less to demonstrate improved functional mobility and increased leg strength.     Baseline: 23.64 sec Goal status: IN PROGRESS  5.   Patient will increase bilateral leg MMT's to 4+ to 5/5 to allow navigation of steps without gait deviation or loss of balance  Baseline: see above Goal status: IN PROGRESS   PLAN:  PT FREQUENCY: 2x/week  PT DURATION: 4  weeks  PLANNED INTERVENTIONS: 97164- PT Re-evaluation, 97110-Therapeutic exercises, 97530- Therapeutic activity, 97112- Neuromuscular re-education, 97535- Self Care, 02859- Manual therapy, [id number]- Gait training, [id number]- Orthotic Fit/training, [phone]- Canalith repositioning, [id number]- Aquatic Therapy, 97760- Splinting, [id number]- Wound care (first 20 sq cm), 97598- Wound care (each additional 20 sq cm)Patient/Family education, Balance training, Stair training, Taping, Dry Needling, Joint mobilization, Joint manipulation, Spinal manipulation, Spinal mobilization, Scar mobilization, and DME instructions. SABRA  PLAN FOR NEXT SESSION: Progress functional, core and postural strengthening.   Lang Ada, PT, DPT [facility] Office: [phone] 10:29 AM, 07/05/24

## 2024-07-06 ENCOUNTER — Other Ambulatory Visit: Payer: Self-pay

## 2024-07-06 DIAGNOSIS — M7062 Trochanteric bursitis, left hip: Secondary | ICD-10-CM

## 2024-07-09 ENCOUNTER — Encounter: Payer: Self-pay | Admitting: Radiology

## 2024-07-10 ENCOUNTER — Encounter (HOSPITAL_COMMUNITY): Payer: Self-pay

## 2024-07-10 ENCOUNTER — Ambulatory Visit (HOSPITAL_COMMUNITY)

## 2024-07-10 DIAGNOSIS — R262 Difficulty in walking, not elsewhere classified: Secondary | ICD-10-CM | POA: Diagnosis present

## 2024-07-10 DIAGNOSIS — M545 Low back pain, unspecified: Secondary | ICD-10-CM | POA: Insufficient documentation

## 2024-07-10 DIAGNOSIS — R29898 Other symptoms and signs involving the musculoskeletal system: Secondary | ICD-10-CM | POA: Insufficient documentation

## 2024-07-10 DIAGNOSIS — M5442 Lumbago with sciatica, left side: Secondary | ICD-10-CM | POA: Insufficient documentation

## 2024-07-10 NOTE — Therapy (Addendum)
 " OUTPATIENT PHYSICAL THERAPY THORACOLUMBAR TREATMENT    Patient Name: Veronica Arnold MRN: [id number] DOB:[birth year], 53 y.o., female Today's Date: 07/10/2024  END OF SESSION:  PT End of Session - 07/10/24 0909     Visit Number 11    Number of Visits 14    Date for Recertification  08/03/24    Authorization Type [employer] CARITAS NEXT    Authorization Time Period [employer] approved 8 more visits 06/02/24-08/03/24    Authorization - Visit Number 5    Authorization - Number of Visits 8    Progress Note Due on Visit 8    PT Start Time 0909   Patient arrived late to her appointment.   PT Stop Time 0941    PT Time Calculation (min) 32 min    Activity Tolerance Patient limited by pain;Patient limited by fatigue    Behavior During Therapy [facility] for tasks assessed/performed                Past Medical History:  Diagnosis Date   High cholesterol    Hypertension    Past Surgical History:  Procedure Laterality Date   HERNIA REPAIR     TUBAL LIGATION     TUBAL LIGATION     Patient Active Problem List   Diagnosis Date Noted   Chronic left-sided low back pain with left-sided sciatica 01/24/2024   Greater trochanteric bursitis of left hip 12/28/2023   Bilateral hand pain 10/04/2023   Right foot pain 04/26/2023   Wheezing 01/26/2023   Prediabetes 12/02/2022   Vitamin D  deficiency 12/02/2022   Colon cancer screening 12/02/2022   Breast cancer screening by mammogram 12/02/2022   Need for Tdap vaccination 12/02/2022   Encounter for general adult medical examination with abnormal findings 07/19/2022   Essential hypertension 11/30/2015   Hyperlipidemia 11/30/2015   AP (abdominal pain) 11/30/2015   Cigarette nicotine dependence without complication 11/30/2015    PCP: Bevely Doffing FNP  REFERRING PROVIDER: Brenna Lin, MD  REFERRING DIAG: [phone] (ICD-10-CM) - Left sided sciatica  Rationale for Evaluation and Treatment: Rehabilitation  THERAPY DIAG:  Low back pain  with left-sided sciatica, unspecified back pain laterality, unspecified chronicity  Difficulty in walking, not elsewhere classified  Low back pain, unspecified back pain laterality, unspecified chronicity, unspecified whether sciatica present  Left leg weakness  ONSET DATE: 6 months or so  SUBJECTIVE:                                                                                                                                                                                           SUBJECTIVE STATEMENT: Patient reports that she is in very  bad pain today. She notes that she hurt all weekend after some of the new exercises she did at her last appointment.   EVAL: Pain started about 6 months ago.  Insidious onset; couldn't get up on morning.  Pain from left low back and hip all the way down to her left foot; lower leg feels wet.  Saw PCP first and then she sent to [name].  Had x-rays done of hip and back and both showed arthritis.  Gave Prednisone ; did not help; [place] referred to therapy.  Has also had injections and those did not help.    PERTINENT HISTORY:  Bilateral hand pain  PAIN:  Are you having pain? Yes: NPRS scale: 8/10 Pain location: left side back and down left leg Pain description: feels wet Aggravating factors: standing too long, walking too long Relieving factors: sit, rest, meloxicam   PRECAUTIONS: None  WEIGHT BEARING RESTRICTIONS: No  FALLS:  Has patient fallen in last 6 months? No   OCCUPATION: CNA  PLOF: Independent  PATIENT GOALS: make me walk right  NEXT MD VISIT: PRN  OBJECTIVE:  Note: Objective measures were completed at Evaluation unless otherwise noted.  DIAGNOSTIC FINDINGS:  Clinical:  lower back pain with left sided sciatica, no trauma   X-rays were done of the lumbar spine, five views.   There is normal lumbar lordosis present of the lumbar spine.  There are small anterior osteophytes present L3 and L4.  Disc spaces are  maintained.  No fracture is noted.  Bone quality is good.   Impression:  mild degenerative changes of the lumbar spine, no acute findings.   Electronically Signed Lemond Brenna, MD 7/16/20259:40 AM  CLINICAL DATA:  Left hip pain.   EXAM: DG HIP (WITH OR WITHOUT PELVIS) 2-3V LEFT   COMPARISON:  None Available.   FINDINGS: Mild bilateral sacroiliac subchondral sclerosis. Minimal bilateral superior femoroacetabular joint space narrowing. Mild bilateral superolateral left greater than right acetabular degenerative osteophytosis.   There are left greater than right superior lateral femoral head-neck junction likely degenerative subchondral cysts, measuring up to 13 mm on the left.   Minimal superior pubic symphysis degenerative spurring.   No acute fracture or dislocation.   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    PATIENT SURVEYS:  Modified Oswestry:  MODIFIED OSWESTRY DISABILITY SCALE   Date: 04/24/24 Score 06/29/24  Total 32/50; 64% 76% disability   Interpretation of scores: Score Category Description  0-20% Minimal Disability The patient can cope with most living activities. Usually no treatment is indicated apart from advice on lifting, sitting and exercise  21-40% Moderate Disability The patient experiences more pain and difficulty with sitting, lifting and standing. Travel and social life are more difficult and they may be disabled from work. Personal care, sexual activity and sleeping are not grossly affected, and the patient can usually be managed by conservative means  41-60% Severe Disability Pain remains the main problem in this group, but activities of daily living are affected. These patients require a detailed investigation  61-80% Crippled Back pain impinges on all aspects of the patients life. Positive intervention is required  81-100% Bed-bound  These patients are either bed-bound or exaggerating their symptoms  [name], et al. Surgery versus conservative management of stable thoracolumbar fracture: the PRESTO feasibility RCT. [place] ([place]): [employer]; 2021 Nov. [facility] Technology Assessment, No. 25.62.) Appendix 3, Oswestry Disability Index category descriptors. Available from: Findjewelers.cz  Minimally Clinically Important Difference (MCID) = 12.8%  COGNITION: Overall cognitive status: Within functional limits for tasks assessed     SENSATION: Decreased sensation left lower leg and foot to light touch  POSTURE: rounded shoulders, forward head, and increased lumbar lordosis  PALPATION: Very tender on palpation to lower lumber spine and left hip and glute  LUMBAR ROM: * pain  AROM eval 05/30/24  Flexion Fingertips to distal pole of patella * Touches toes WNL No pain  Extension 30% available* WNL No pain  Right lateral flexion Fingertips to 2 above knee joint line To knee No pain  Left lateral flexion 2 above knee joint line* To knee No pain  Right rotation    Left rotation     (Blank rows = not tested)  LOWER EXTREMITY MMT:    MMT Right eval Left eval Right 05/30/24 Left 05/30/24 Right 06/29/24 Left 06/29/24  Hip flexion 4+ 3- 5 3- 3+/5 3/5  Hip extension 4- 2+ 3- prone 3- prone    Hip abduction   5 4    Hip adduction        Hip internal rotation        Hip external rotation        Knee flexion   5 3- 4-/5 3/5  Knee extension 5 3+ 5 4 3+/5 3/5; painful   Ankle dorsiflexion 5 4 5  4+ 3+/5 3/5  Ankle plantarflexion        Ankle inversion        Ankle eversion         (Blank rows = not tested)  Hamstring Length RLE: 165 deg LLE: Unable to assess, pt really guarded and reports increased pain even to light touch    FUNCTIONAL TESTS:  5 times sit to stand:  23.91 sec using hands to assist up to standing   05/30/24 PROGRESS NOTE: Modified oswestry:  23 / 50 = 46.0 % (was 32/50; 64%) 5 times sit to stand:  05/30/24: 16.06 no UE assist (was 23.91 sec using hands to assist up to standing) : 226 feet with SPC  GAIT: Distance walked: 50 ft in clinic Assistive device utilized: Single point cane Level of assistance: Modified independence Comments: slight antalgic gait  TREATMENT DATE:                                    07/10/24 EXERCISE LOG  Exercise Repetitions and Resistance Comments  Lower trunk rotation  2 minutes    Supine hip ADD isometric  2 minutes w/ 5 second hold   Bent knee fall out  15 reps each  Supine   Supine glute set   2 minutes  w/ 5 second hold    Supine quad sets   2 minutes w/ 5 second hold   Seated march  15 reps each    Seated heel/toe raises   2 minutes    Nustep   L3 x 4.5 minutes @ seat 6    Blank cell = exercise not performed today   07/05/2024  Therapeutic Exercise: -Modified crunch, 2 sets of 10 reps, 3 second holds, pt cued for set up and sequencing, and appropriate ROM -Side plank from knees, 1 sets of 3 reps, 20 seconds,  bilateral, pt cued for sequencing -Bird dogs, 2 sets of 5 reps bilaterally, pt cued for increased ROM of LE and neutral spine -Lateral stepping 1 laps 10 steps per lap, with RTB around ankles, pt cued for upright  posture and sequencing -Monster walks, 1 lap of 10 steps, with RTB at ankles, pt cued for proper sequencing -Nustep, level 3 resistance, 5 minutes, pt cued for 70 spm Neuromuscular Re-education: -DKTC on green exercise ball, 2 sets of 10 reps, pt cued to remain in pain free ROM -LTR 2 set of 10 reps bilaterally, pt cued to remain in pain free ROM                                   06/29/24 EXERCISE LOG  Exercise Repetitions and Resistance Comments  Goal assessment  See below for progress note    Patient education  See below   Nustep  L4 x 5 minutes     Blank cell = exercise not performed today   06/06/24 Nustep  UE/LE seat 6 x 5' average level 3 heelraises 20X  Hip abduction 2X10 each LE  Hip extension 2X10 each  LE  High marching 2X10 each LE  RTB rows 2X10  RTB shoulder extensions 2X10  RTB pallof presses 2X10 each   PATIENT EDUCATION:  Education details: Patient educated on exam findings, POC, prognosis, anatomy, and MRI results Person educated: Patient Education method: Explanation Education comprehension: verbalized understanding   HOME EXERCISE PROGRAM: Access Code: [email or web address] URL: [email or web address] Date: 05/21/2024 Prepared by: Augustin Mclean Exercises - Hooklying Isometric Hip Abduction Adduction with Belt and Ball  - 2 x daily - 7 x weekly - 1 sets - 10 reps - 5 hold - Supine Transversus Abdominis Bracing - Hands on Stomach  - 2 x daily - 7 x weekly - 2 sets - 10 reps - 5 hold - Correct Seated Posture  - 5 x daily - 7 x weekly - 1 sets - 1 reps - Seated Scapular Retraction  - 2 x daily - 7 x weekly - 2 sets - 10 reps - 5 hold   Access Code: [email or web address] URL: [email or web address] Date: 05/23/2024 Prepared by: Greig Fuse - Seated Piriformis Stretch with Trunk Bend  - 2 x daily - 7 x weekly - 2 sets - 3 reps - 30 sec hold - Sit to Stand  - 2 x daily - 7 x weekly - 2 sets - 10 reps - Standing Bilateral Low Shoulder Row with Anchored Resistance  - 2 x daily - 7 x weekly - 2 sets - 10 reps - Shoulder Extension with Resistance  - 2 x daily - 7 x weekly - 2 sets - 10 reps  05/29/24 Decompression exercises 1-5  05/30/24:  theraband rows and shoulder extension 2X10 each day red band  ASSESSMENT:  CLINICAL IMPRESSION: Patient presented with high pain severity and irritability which limited her ability to be progressed with new interventions. She required moderate multimodal cueing with supine glute sets to facilitate appropriate gluteal engagement without aggravating her familiar symptoms. Fatigue was her primary limiting factor with today's interventions as she required multiple rest breaks throughout treatment. She reported feeling fine upon the  conclusion of treatment. Patient continues to require skilled physical therapy to address her remaining impairments to return to her prior level of function.    PHYSICAL THERAPY DISCHARGE SUMMARY  Visits from Start of Care: 11  Current functional level related to goals / functional outcomes: Patient was able to partially meet her goals for skilled physical therapy.    Remaining deficits: Pain, lower extremity muscular strength, and lower extremity muscular power   Education / Equipment: HEP  Patient agrees to discharge. Patient goals were partially met. Patient is being discharged due to not returning since the last visit.  Lacinda Fass, PT, DPT     EVAL: Patient is a 53 y.o. female who was seen today for physical therapy evaluation and treatment for M54.32 (ICD-10-CM) - Left sided sciatica.  Patient demonstrates muscle weakness, reduced ROM, and fascial restrictions which are likely contributing to symptoms of pain and are negatively impacting patient ability to perform ADLs and functional mobility tasks. Patient will benefit from skilled physical therapy services to address these deficits to reduce pain and improve level of function with ADLs and functional mobility tasks.   OBJECTIVE IMPAIRMENTS: Abnormal gait, decreased activity tolerance, decreased mobility, difficulty walking, decreased ROM, decreased strength, impaired perceived functional ability, and pain.   ACTIVITY LIMITATIONS: carrying, lifting, bending, standing, squatting, sleeping, and locomotion level  PARTICIPATION LIMITATIONS: meal prep, cleaning, laundry, shopping, community activity, and occupation   REHAB POTENTIAL: Good  CLINICAL DECISION MAKING: Evolving/moderate complexity  EVALUATION COMPLEXITY: Moderate   GOALS: Goals reviewed with patient? Yes  SHORT TERM GOALS: Target date: 05/18/2024  patient will be independent with initial HEP  Baseline: Goal status: MET  2.  Patient will report 30%  improvement overall  Baseline:  Goal status: MET  LONG TERM GOALS: Target date: 06/01/2024  Patient will be independent in self management strategies to improve quality of life and functional outcomes.  Baseline:  Goal status: IN PROGRESS  2.  Patient will report 50% improvement overall  Baseline:  Goal status: MET  3.  Patient will improve Modified Oswestry score by 12 points (20/50 or less) to demonstrate improved perceived function  Baseline: 38/50 Goal status: IN PROGRESS  4.  Patient will improve 5 times sit to stand score to 15 sec or less to demonstrate improved functional mobility and increased leg strength.     Baseline: 23.64 sec Goal status: IN PROGRESS  5.   Patient will increase bilateral leg MMT's to 4+ to 5/5 to allow navigation of steps without gait deviation or loss of balance  Baseline: see above Goal status: IN PROGRESS   PLAN:  PT FREQUENCY: 2x/week  PT DURATION: 4 weeks  PLANNED INTERVENTIONS: 97164- PT Re-evaluation, 97110-Therapeutic exercises, 97530- Therapeutic activity, 97112- Neuromuscular re-education, 97535- Self Care, 02859- Manual therapy, [id number]- Gait training, [phone]- Orthotic Fit/training, [phone]- Canalith repositioning, [id number]- Aquatic Therapy, 97760- Splinting, [id number]- Wound care (first 20 sq cm), 97598- Wound care (each additional 20 sq cm)Patient/Family education, Balance training, Stair training, Taping, Dry Needling, Joint mobilization, Joint manipulation, Spinal manipulation, Spinal mobilization, Scar mobilization, and DME instructions. SABRA  PLAN FOR NEXT SESSION: Progress functional, core and postural strengthening.   Lacinda Fass, PT, DPT  9:45 AM, 07/10/24  "

## 2024-07-11 ENCOUNTER — Other Ambulatory Visit: Payer: Self-pay

## 2024-07-11 NOTE — Telephone Encounter (Signed)
 Copied from CRM [id number]. Topic: Clinical - Medication Refill >> Jul 11, 2024 12:24 PM Zebedee SAUNDERS wrote: Medication: HYDROcodone -acetaminophen  (NORCO/VICODIN) 5-325 MG tablet  Has the patient contacted their pharmacy? Yes (Agent: If no, request that the patient contact the pharmacy for the refill. If patient does not wish to contact the pharmacy document the reason why and proceed with request.) (Agent: If yes, when and what did the pharmacy advise?)  This is the patient's preferred pharmacy:  [facility] [address], [place] - [address] [place] [address] Phone: [phone] Fax: [phone]  Is this the correct pharmacy for this prescription? Yes If no, delete pharmacy and type the correct one.   Has the prescription been filled recently? Yes  Is the patient out of the medication? Yes  Has the patient been seen for an appointment in the last year OR does the patient have an upcoming appointment? Yes  Can we respond through MyChart? Yes  Agent: Please be advised that Rx refills may take up to 3 business days. We ask that you follow-up with your pharmacy.

## 2024-07-12 ENCOUNTER — Encounter (HOSPITAL_COMMUNITY): Admitting: Physical Therapy

## 2024-07-16 ENCOUNTER — Encounter (HOSPITAL_COMMUNITY)

## 2024-07-17 ENCOUNTER — Telehealth: Payer: Self-pay | Admitting: Orthopedic Surgery

## 2024-07-17 NOTE — Telephone Encounter (Signed)
 Dr. Onesimo pt - pt lvm requesting a refill for Hydrocodone  5-325.

## 2024-07-18 MED ORDER — HYDROCODONE-ACETAMINOPHEN 5-325 MG PO TABS: 1.0000 | ORAL_TABLET | Freq: Four times a day (QID) | ORAL | 0 refills | Status: DC | PRN

## 2024-07-19 DIAGNOSIS — G8929 Other chronic pain: Secondary | ICD-10-CM

## 2024-07-19 NOTE — Telephone Encounter (Signed)
 My Chart message sent

## 2024-07-23 ENCOUNTER — Encounter: Payer: Self-pay | Admitting: Physical Medicine and Rehabilitation

## 2024-07-23 ENCOUNTER — Ambulatory Visit: Admitting: Physical Medicine and Rehabilitation

## 2024-07-23 DIAGNOSIS — M5442 Lumbago with sciatica, left side: Secondary | ICD-10-CM

## 2024-07-23 DIAGNOSIS — M5416 Radiculopathy, lumbar region: Secondary | ICD-10-CM | POA: Diagnosis not present

## 2024-07-23 DIAGNOSIS — G894 Chronic pain syndrome: Secondary | ICD-10-CM

## 2024-07-23 DIAGNOSIS — G8929 Other chronic pain: Secondary | ICD-10-CM

## 2024-07-23 NOTE — Progress Notes (Unsigned)
 Veronica Arnold - 53 y.o. female MRN [id number]  Date of birth: February 26, 1971  Office Visit Note: Visit Date: 07/23/2024 PCP: Bevely Doffing, FNP Referred by: Bevely Doffing, FNP  Subjective: Chief Complaint  Patient presents with   Lower Back - Pain   HPI: Veronica Arnold is a 53 y.o. female who comes in today Per the request of Dr. Oneil Horde for evaluation of chronic, worsening and severe left sided lower back pain radiating to buttock and down lateral leg to ankle. Also reports numbness/tingling distal to left knee. Pain ongoing since April in 2024. Her pain worsens with standing and activity. She describes pain as throbbing and sore sensation, currently rates as 10 out of 10. Some relief of pain with home exercise regimen, rest and use of medications. She is attending formal physical therapy in [place] with some relief of pain. Her PCP and Dr. Horde recently placed referral for chronic pain management. She is currently taking Norco. Recent lumbar MRI imaging shows moderate facet arthropathy and moderate bilateral foraminal stenosis at L4-L5. No high grade spinal canal stenosis. No history of lumbar surgery/injections. She is currently using cane to assist with ambulation. No recent trauma or falls.      Review of Systems  Musculoskeletal:  Positive for back pain.  Neurological:  Positive for tingling. Negative for sensory change, focal weakness and weakness.  All other systems reviewed and are negative.  Otherwise per HPI.  Assessment & Plan: Visit Diagnoses:    ICD-10-CM   1. Chronic left-sided low back pain with left-sided sciatica  M54.42 Ambulatory referral to Physical Medicine Rehab   G89.29     2. Lumbar radiculopathy  M54.16 Ambulatory referral to Physical Medicine Rehab    3. Chronic pain syndrome  G89.4 Ambulatory referral to Physical Medicine Rehab       Plan: Findings:  Chronic, worsening and severe left sided lower back pain radiating to buttock and down lateral  leg to ankle. Patient continues to have severe pain despite good conservative therapies such as formal physical therapy, home exercise regimen, rest and use of medications. Patients clinical presentation and exam are consistent with lumbar radiculopathy, more of L5 nerve pattern. There is moderate facet arthropathy and bilateral foraminal stenosis at L4-L5. We discussed treatment plan in detail today. Next step is to place order for diagnostic and hopefully therapeutic left L4-L5 interlaminar epidural steroid injection. She is not currently taking anticoagulant medications. I discussed injection procedure in detail today. She has no questions at this time. She can follow up with her PCP and Dr. Horde for continued chronic pain management. No red flag symptoms noted upon exam today.     Meds & Orders: No orders of the defined types were placed in this encounter.   Orders Placed This Encounter  Procedures   Ambulatory referral to Physical Medicine Rehab    Follow-up: Return for Left L4-L5 interlaminar epidural steroid injection.   Procedures: No procedures performed      Clinical History: CLINICAL DATA:  Low back pain   EXAM: MRI LUMBAR SPINE WITHOUT CONTRAST   TECHNIQUE: Multiplanar, multisequence MR imaging of the lumbar spine was performed. No intravenous contrast was administered.   COMPARISON:  None Available.   FINDINGS: Bone marrow: No significant abnormality   Conus and cauda equina: No significant abnormality   Paraspinal tissues: No significant abnormality   L1-L2: Normal   L2-L3: Normal   L3-L4: Mild degenerative disc disease with mild disc bulge. No significant facet disease.  L4-L5: There is mild degenerative disc disease with a mild disc bulge. There is facet arthropathy with facet enlargement. There is mild spinal stenosis. There is moderate bilateral neural foraminal stenosis   L5-S1: There is mild degenerative disc disease with a mild disc bulge. There  is mild facet arthropathy. There is no spinal stenosis. There is mild bilateral neural foraminal stenosis.   IMPRESSION: 1. Mild degenerative disc disease with disc bulge at L4-5 and moderate bilateral facet arthropathy. There is moderate bilateral neural foraminal stenosis and mild spinal stenosis. 2. Mild degenerative changes at at L3-4 and L5-S1     Electronically Signed   By: Nancyann Burns M.D.   On: 06/22/2024 11:56   She reports that she has been smoking cigarettes. She has a 12.5 pack-year smoking history. She has never used smokeless tobacco.  Recent Labs    12/28/23 1638  HGBA1C 6.0*    Objective:  VS:  HT:    WT:   BMI:     BP:   HR: bpm  TEMP: ( )  RESP:  Physical Exam Vitals and nursing note reviewed.  HENT:     Head: Normocephalic and atraumatic.     Right Ear: External ear normal.     Left Ear: External ear normal.     Nose: Nose normal.     Mouth/Throat:     Mouth: Mucous membranes are moist.  Eyes:     Extraocular Movements: Extraocular movements intact.  Cardiovascular:     Rate and Rhythm: Normal rate.     Pulses: Normal pulses.  Pulmonary:     Effort: Pulmonary effort is normal.  Abdominal:     General: Abdomen is flat. There is no distension.  Musculoskeletal:        General: Tenderness present.     Cervical back: Normal range of motion.     Comments: Patient rises from seated position to standing without difficulty. Good lumbar range of motion. No pain noted with facet loading. 5/5 strength noted with bilateral hip flexion, knee flexion/extension, ankle dorsiflexion/plantarflexion and EHL. No clonus noted bilaterally. No pain upon palpation of greater trochanters. No pain with internal/external rotation of bilateral hips. Sensation intact bilaterally. Negative slump test bilaterally. Ambulates without aid, gait steady.     Skin:    General: Skin is warm and dry.     Capillary Refill: Capillary refill takes less than 2 seconds.  Neurological:      General: No focal deficit present.     Mental Status: She is alert and oriented to person, place, and time.  Psychiatric:        Mood and Affect: Mood normal.        Behavior: Behavior normal.     Ortho Exam  Imaging: No results found.  Past Medical/Family/Surgical/Social History: Medications & Allergies reviewed per EMR, new medications updated. Patient Active Problem List   Diagnosis Date Noted   Chronic left-sided low back pain with left-sided sciatica 01/24/2024   Greater trochanteric bursitis of left hip 12/28/2023   Bilateral hand pain 10/04/2023   Right foot pain 04/26/2023   Wheezing 01/26/2023   Prediabetes 12/02/2022   Vitamin D  deficiency 12/02/2022   Colon cancer screening 12/02/2022   Breast cancer screening by mammogram 12/02/2022   Need for Tdap vaccination 12/02/2022   Encounter for general adult medical examination with abnormal findings 07/19/2022   Essential hypertension 11/30/2015   Hyperlipidemia 11/30/2015   AP (abdominal pain) 11/30/2015   Cigarette nicotine dependence without complication 11/30/2015  Past Medical History:  Diagnosis Date   High cholesterol    Hypertension    Family History  Problem Relation Age of Onset   [name]    Stroke Brother    Heart attack Brother    Past Surgical History:  Procedure Laterality Date   HERNIA REPAIR     TUBAL LIGATION     TUBAL LIGATION     Social History   Occupational History   Not on file  Tobacco Use   Smoking status: Every Day    Current packs/day: 0.50    Average packs/day: 0.5 packs/day for 25.0 years (12.5 ttl pk-yrs)    Types: Cigarettes   Smokeless tobacco: Never  Vaping Use   Vaping status: Never Used  Substance and Sexual Activity   Alcohol use: No    Alcohol/week: 0.0 standard drinks of alcohol   Drug use: No   Sexual activity: Yes    Birth control/protection: Surgical

## 2024-07-23 NOTE — Progress Notes (Unsigned)
 Pain Scale   Average Pain 3 Patient advising she has chronic lower back pain radiating to left leg/ pain is constant- Patient advising she had an MRI done x 1 month ago at [facility]        +Driver, -BT, -Dye Allergies.

## 2024-07-24 ENCOUNTER — Telehealth: Payer: Self-pay

## 2024-07-24 NOTE — Telephone Encounter (Signed)
 [facility] has placed referral to pain management

## 2024-07-24 NOTE — Telephone Encounter (Signed)
 Correct - Patient will need to contact their Office to follow up on Referral since it was placed by their Office.

## 2024-07-24 NOTE — Telephone Encounter (Signed)
 Copied from CRM [id number]. Topic: Referral - Question >> Jul 24, 2024 12:56 PM Kevelyn M wrote: Reason for CRM: Patient calling in about a referral to pain management. It looks like it's closed. Please advise patient.  Call back # [phone]

## 2024-07-24 NOTE — Telephone Encounter (Signed)
 Patient states that Dr. Onesimo mentioned sending her to pain management when he was in the room with her, but it wasn't put on her AVS. She would like to know where she will be referred to.  Please call and advise [phone]

## 2024-07-24 NOTE — Telephone Encounter (Signed)
 Message sent to patient

## 2024-07-25 NOTE — Telephone Encounter (Signed)
 Called and gave pt information to clinic she has been referred to. No further questions or concerns at this time.

## 2024-07-27 ENCOUNTER — Telehealth: Payer: Self-pay

## 2024-07-27 ENCOUNTER — Other Ambulatory Visit: Payer: Self-pay

## 2024-07-27 DIAGNOSIS — G8929 Other chronic pain: Secondary | ICD-10-CM

## 2024-07-27 DIAGNOSIS — M7062 Trochanteric bursitis, left hip: Secondary | ICD-10-CM

## 2024-07-27 MED ORDER — HYDROCODONE-ACETAMINOPHEN 5-325 MG PO TABS: 1.0000 | ORAL_TABLET | Freq: Four times a day (QID) | ORAL | 0 refills | Status: DC | PRN

## 2024-07-27 NOTE — Telephone Encounter (Signed)
 Copied from CRM [id number]. Topic: Clinical - Medication Question >> Jul 27, 2024  1:10 PM Thliyah D wrote: Medication: HYDROcodone -acetaminophen  (NORCO/VICODIN) 5-325 MG tablet, amLODipine  (NORVASC ) 10 MG tablet Pt wants to know if she can get this  while she waits on the pain management.

## 2024-07-27 NOTE — Telephone Encounter (Signed)
Pain med refilled 

## 2024-07-30 ENCOUNTER — Other Ambulatory Visit: Payer: Self-pay

## 2024-07-30 ENCOUNTER — Ambulatory Visit: Payer: Self-pay

## 2024-07-30 DIAGNOSIS — I10 Essential (primary) hypertension: Secondary | ICD-10-CM

## 2024-07-30 MED ORDER — AMLODIPINE BESYLATE 10 MG PO TABS: ORAL_TABLET | ORAL | 1 refills | Status: AC

## 2024-08-07 ENCOUNTER — Ambulatory Visit: Admitting: Orthopedic Surgery

## 2024-08-07 ENCOUNTER — Other Ambulatory Visit: Payer: Self-pay

## 2024-08-07 DIAGNOSIS — M5442 Lumbago with sciatica, left side: Secondary | ICD-10-CM

## 2024-08-07 DIAGNOSIS — G8929 Other chronic pain: Secondary | ICD-10-CM

## 2024-08-07 DIAGNOSIS — M7062 Trochanteric bursitis, left hip: Secondary | ICD-10-CM

## 2024-08-07 MED ORDER — HYDROCODONE-ACETAMINOPHEN 5-325 MG PO TABS: 1.0000 | ORAL_TABLET | Freq: Four times a day (QID) | ORAL | 0 refills | Status: DC | PRN

## 2024-08-09 ENCOUNTER — Encounter: Payer: Self-pay | Admitting: Orthopedic Surgery

## 2024-08-09 NOTE — Progress Notes (Signed)
 Return patient Visit  Assessment: Veronica Arnold is a 53 y.o. female with the following: 1. Chronic left-sided low back pain with left-sided sciatica  Plan: Veronica Arnold continues to have severe lower back pain.  She has been evaluated prior to receiving an injection from [name].  Her appointment with [name] is not for couple of weeks.  She has not been evaluated in pain management clinic.  She is requesting more pain medicine.  I provided her with another prescription.  She will follow-up as needed.   Follow-up: Return if symptoms worsen or fail to improve.  Subjective:  Chief Complaint  Patient presents with   Back Pain    Pt c/o radicular pain in the R shin area now. Pain has been so bad she's been unable to clean her home. She is scheduled to see [name] 08/23/24.      History of Present Illness: Veronica Arnold is a 53 y.o. female who returns for evaluation of low back pain.  She continues to have a lot of pain in her lower back.  She is using a cane to assist with ambulation.  She has been evaluated prior to receiving injection with [name].  She has an appointment scheduled within the next couple weeks.  She has not been evaluated in pain management clinic.  Pain radiates into the left leg, past her knee.  Review of Systems: No fevers or chills No numbness or tingling No chest pain No shortness of breath No bowel or bladder dysfunction No GI distress No headaches    Objective: There were no vitals taken for this visit.  Physical Exam:  General: Alert and oriented.  She is tearful Gait: Ambulates with the assistance of a cane  Tenderness in the low back.  No tenderness over the greater trochanter laterally.  Smooth internal and external rotation of bilateral hips.  Negative straight leg raise on the right.  She has some pain with straight leg raise on the left.  She has a decrease sensation over the anterior lower leg on the left.  Toes are warm and  well-perfused  Imaging  I personally reviewed images previously obtained in clinic  MRI lumbar spine  IMPRESSION: 1. Mild degenerative disc disease with disc bulge at L4-5 and moderate bilateral facet arthropathy. There is moderate bilateral neural foraminal stenosis and mild spinal stenosis. 2. Mild degenerative changes at at L3-4 and L5-S1    New Medications:  Meds ordered this encounter  Medications   HYDROcodone -acetaminophen  (NORCO/VICODIN) 5-325 MG tablet    Sig: Take 1 tablet by mouth every 6 (six) hours as needed.    Dispense:  28 tablet    Refill:  0      [name], MD  08/09/2024 9:13 AM

## 2024-08-15 LAB — COLOGUARD: COLOGUARD: NEGATIVE

## 2024-08-22 ENCOUNTER — Ambulatory Visit (INDEPENDENT_AMBULATORY_CARE_PROVIDER_SITE_OTHER)

## 2024-08-22 VITALS — BP 145/87 | HR 79 | Ht 59.0 in | Wt 172.1 lb

## 2024-08-22 DIAGNOSIS — I10 Essential (primary) hypertension: Secondary | ICD-10-CM

## 2024-08-22 DIAGNOSIS — E785 Hyperlipidemia, unspecified: Secondary | ICD-10-CM | POA: Diagnosis not present

## 2024-08-22 DIAGNOSIS — E559 Vitamin D deficiency, unspecified: Secondary | ICD-10-CM | POA: Diagnosis not present

## 2024-08-22 DIAGNOSIS — M67432 Ganglion, left wrist: Secondary | ICD-10-CM | POA: Diagnosis not present

## 2024-08-22 DIAGNOSIS — M5442 Lumbago with sciatica, left side: Secondary | ICD-10-CM | POA: Diagnosis not present

## 2024-08-22 DIAGNOSIS — R7303 Prediabetes: Secondary | ICD-10-CM

## 2024-08-22 DIAGNOSIS — G8929 Other chronic pain: Secondary | ICD-10-CM | POA: Diagnosis not present

## 2024-08-22 MED ORDER — HYDROCODONE-ACETAMINOPHEN 5-325 MG PO TABS: 1.0000 | ORAL_TABLET | Freq: Four times a day (QID) | ORAL | 0 refills | Status: DC | PRN

## 2024-08-22 MED ORDER — PREGABALIN 75 MG PO CAPS: 75.0000 mg | ORAL_CAPSULE | Freq: Two times a day (BID) | ORAL | 2 refills | Status: DC

## 2024-08-22 NOTE — Progress Notes (Unsigned)
° °  Established Patient Office Visit  Subjective   Patient ID: Veronica Arnold, female    DOB: [birth year]  Age: 53 y.o. MRN: [id number]  Chief Complaint  Patient presents with   Medical Management of Chronic Issues    Pt here for a follow up     HPI  Patient Active Problem List   Diagnosis Date Noted   Chronic left-sided low back pain with left-sided sciatica 01/24/2024   Greater trochanteric bursitis of left hip 12/28/2023   Bilateral hand pain 10/04/2023   Right foot pain 04/26/2023   Wheezing 01/26/2023   Prediabetes 12/02/2022   Vitamin D  deficiency 12/02/2022   Colon cancer screening 12/02/2022   Breast cancer screening by mammogram 12/02/2022   Need for Tdap vaccination 12/02/2022   Encounter for general adult medical examination with abnormal findings 07/19/2022   Essential hypertension 11/30/2015   Hyperlipidemia 11/30/2015   AP (abdominal pain) 11/30/2015   Cigarette nicotine dependence without complication 11/30/2015      ROS    Objective:     BP (!) 145/87   Pulse 79   Ht 4' 11 (1.499 m)   Wt 172 lb 1.9 oz (78.1 kg)   SpO2 94%   BMI 34.76 kg/m  BP Readings from Last 3 Encounters:  08/22/24 (!) 145/87  07/03/24 (!) 149/82  06/07/24 128/74   Wt Readings from Last 3 Encounters:  08/22/24 172 lb 1.9 oz (78.1 kg)  07/03/24 170 lb (77.1 kg)  06/07/24 170 lb (77.1 kg)      Physical Exam   No results found for any visits on 08/22/24.  {Labs (Optional):23779}  The 10-year ASCVD risk score (Arnett DK, et al., 2019) is: 9.8%    Assessment & Plan:   Problem List Items Addressed This Visit   None   No follow-ups on file.    Leita Longs, FNP

## 2024-08-23 ENCOUNTER — Other Ambulatory Visit: Payer: Self-pay

## 2024-08-23 ENCOUNTER — Ambulatory Visit: Admitting: Physical Medicine and Rehabilitation

## 2024-08-23 VITALS — BP 145/76 | HR 64

## 2024-08-23 DIAGNOSIS — M5416 Radiculopathy, lumbar region: Secondary | ICD-10-CM

## 2024-08-23 MED ORDER — METHYLPREDNISOLONE ACETATE 40 MG/ML IJ SUSP
40.0000 mg | Freq: Once | INTRAMUSCULAR | Status: AC
  Administered 2024-08-23: 15:00:00 40 mg

## 2024-08-23 NOTE — Progress Notes (Unsigned)
 Pain Scale   Average Pain 10 Patient advising she has lower back pain radiating bilaterally to legs pain is constant.        +Driver, -BT, -Dye Allergies.

## 2024-08-25 ENCOUNTER — Ambulatory Visit: Payer: Self-pay

## 2024-08-25 LAB — LIPID PANEL
Chol/HDL Ratio: 3.2 ratio (ref 0.0–4.4)
Cholesterol, Total: 181 mg/dL (ref 100–199)
HDL: 56 mg/dL (ref >39)
LDL Chol Calc (NIH): 114 mg/dL — ABNORMAL HIGH (ref 0–99)
Triglycerides: 60 mg/dL (ref 0–149)
VLDL Cholesterol Cal: 11 mg/dL (ref 5–40)

## 2024-08-25 LAB — CMP14+EGFR
ALT: 21 IU/L (ref 0–32)
AST: 22 IU/L (ref 0–40)
Albumin: 4.3 g/dL (ref 3.8–4.9)
Alkaline Phosphatase: 72 IU/L (ref 49–135)
BUN/Creatinine Ratio: 17 (ref 9–23)
BUN: 10 mg/dL (ref 6–24)
Bilirubin Total: 0.6 mg/dL (ref 0.0–1.2)
CO2: 24 mmol/L (ref 20–29)
Calcium: 9.5 mg/dL (ref 8.7–10.2)
Chloride: 103 mmol/L (ref 96–106)
Creatinine, Ser: 0.6 mg/dL (ref 0.57–1.00)
Globulin, Total: 2.6 g/dL (ref 1.5–4.5)
Glucose: 130 mg/dL — ABNORMAL HIGH (ref 70–99)
Potassium: 3.4 mmol/L — ABNORMAL LOW (ref 3.5–5.2)
Sodium: 141 mmol/L (ref 134–144)
Total Protein: 6.9 g/dL (ref 6.0–8.5)
eGFR: 109 mL/min/1.73 (ref >59)

## 2024-08-25 LAB — HEMOGLOBIN A1C
Est. average glucose Bld gHb Est-mCnc: 126 mg/dL
Hgb A1c MFr Bld: 6 % — ABNORMAL HIGH (ref 4.8–5.6)

## 2024-08-25 LAB — VITAMIN D 25 HYDROXY (VIT D DEFICIENCY, FRACTURES): Vit D, 25-Hydroxy: 16.9 ng/mL — AB (ref 30.0–100.0)

## 2024-08-25 NOTE — Assessment & Plan Note (Signed)
Recheck fasting labs.  

## 2024-08-25 NOTE — Assessment & Plan Note (Signed)
 Recheck vitamin D  levels

## 2024-08-25 NOTE — Assessment & Plan Note (Signed)
Recheck A1c level

## 2024-08-25 NOTE — Assessment & Plan Note (Signed)
 Ongoing issue affecting ability to work, exacerbated by ADLs. She just started going to PT after evaluation by ortho by [name]. - Provided a letter for her to stay out of work, due to inability to perform work duties d/t ongoing left hip and leg pain.

## 2024-08-27 NOTE — Progress Notes (Signed)
 "  [name] - 53 y.o. female MRN [id number]  Date of birth: Sep 02, 1971  Office Visit Note: Visit Date: 08/23/2024 PCP: Bevely Doffing, FNP Referred by: Bevely Doffing, FNP  Subjective: Chief Complaint  Patient presents with   Lower Back - Pain   HPI:  Veronica Arnold is a 53 y.o. female who comes in today at the request of Duwaine Pouch, FNP for planned Left L4-5 Lumbar Interlaminar epidural steroid injection with fluoroscopic guidance.  The patient has failed conservative care including home exercise, medications, time and activity modification.  This injection will be diagnostic and hopefully therapeutic.  Please see requesting physician notes for further details and justification.   ROS Otherwise per HPI.  Assessment & Plan: Visit Diagnoses:    ICD-10-CM   1. Lumbar radiculopathy  M54.16 XR C-ARM NO REPORT    Epidural Steroid injection    methylPREDNISolone  acetate (DEPO-MEDROL ) injection 40 mg      Plan: No additional findings.   Meds & Orders:  Meds ordered this encounter  Medications   methylPREDNISolone  acetate (DEPO-MEDROL ) injection 40 mg    Orders Placed This Encounter  Procedures   XR C-ARM NO REPORT   Epidural Steroid injection    Follow-up: Return for visit to requesting provider as needed.   Procedures: No procedures performed  Lumbar Epidural Steroid Injection - Interlaminar Approach with Fluoroscopic Guidance  Patient: Veronica Arnold      Date of Birth: August 31, 1971 MRN: [id number] PCP: Bevely Doffing, FNP      Visit Date: 08/23/2024   Universal Protocol:     Consent Given By: the patient  Position: PRONE  Additional Comments: Vital signs were monitored before and after the procedure. Patient was prepped and draped in the usual sterile fashion. The correct patient, procedure, and site was verified.   Injection Procedure Details:   Procedure diagnoses: Lumbar radiculopathy [M54.16]   Meds Administered:  Meds ordered this encounter   Medications   methylPREDNISolone  acetate (DEPO-MEDROL ) injection 40 mg     Laterality: Left  Location/Site:  L4-5  Needle: 3.5 in., 20 ga. Tuohy  Needle Placement: Paramedian epidural  Findings:   -Comments: Excellent flow of contrast into the epidural space.  Procedure Details: Using a paramedian approach from the side mentioned above, the region overlying the inferior lamina was localized under fluoroscopic visualization and the soft tissues overlying this structure were infiltrated with 4 ml. of 1% Lidocaine without Epinephrine. The Tuohy needle was inserted into the epidural space using a paramedian approach.   The epidural space was localized using loss of resistance along with counter oblique bi-planar fluoroscopic views.  After negative aspirate for air, blood, and CSF, a 2 ml. volume of Isovue-250 was injected into the epidural space and the flow of contrast was observed. Radiographs were obtained for documentation purposes.    The injectate was administered into the level noted above.   Additional Comments:  The patient tolerated the procedure well Dressing: 2 x 2 sterile gauze and Band-Aid    Post-procedure details: Patient was observed during the procedure. Post-procedure instructions were reviewed.  Patient left the clinic in stable condition.   Clinical History: CLINICAL DATA:  Low back pain   EXAM: MRI LUMBAR SPINE WITHOUT CONTRAST   TECHNIQUE: Multiplanar, multisequence MR imaging of the lumbar spine was performed. No intravenous contrast was administered.   COMPARISON:  None Available.   FINDINGS: Bone marrow: No significant abnormality   Conus and cauda equina: No significant abnormality   Paraspinal tissues: No  significant abnormality   L1-L2: Normal   L2-L3: Normal   L3-L4: Mild degenerative disc disease with mild disc bulge. No significant facet disease.   L4-L5: There is mild degenerative disc disease with a mild disc bulge. There is  facet arthropathy with facet enlargement. There is mild spinal stenosis. There is moderate bilateral neural foraminal stenosis   L5-S1: There is mild degenerative disc disease with a mild disc bulge. There is mild facet arthropathy. There is no spinal stenosis. There is mild bilateral neural foraminal stenosis.   IMPRESSION: 1. Mild degenerative disc disease with disc bulge at L4-5 and moderate bilateral facet arthropathy. There is moderate bilateral neural foraminal stenosis and mild spinal stenosis. 2. Mild degenerative changes at at L3-4 and L5-S1     Electronically Signed   By: Nancyann Burns M.D.   On: 06/22/2024 11:56     Objective:  VS:  HT:    WT:   BMI:     BP:(!) 145/76  HR:64bpm  TEMP: ( )  RESP:  Physical Exam Vitals and nursing note reviewed.  Constitutional:      General: She is not in acute distress.    Appearance: Normal appearance. She is not ill-appearing.  HENT:     Head: Normocephalic and atraumatic.     Right Ear: External ear normal.     Left Ear: External ear normal.  Eyes:     Extraocular Movements: Extraocular movements intact.  Cardiovascular:     Rate and Rhythm: Normal rate.     Pulses: Normal pulses.  Pulmonary:     Effort: Pulmonary effort is normal. No respiratory distress.  Abdominal:     General: There is no distension.     Palpations: Abdomen is soft.  Musculoskeletal:        General: Tenderness present.     Cervical back: Neck supple.     Right lower leg: No edema.     Left lower leg: No edema.     Comments: Patient has good distal strength with no pain over the greater trochanters.  No clonus or focal weakness.  Skin:    Findings: No erythema, lesion or rash.  Neurological:     General: No focal deficit present.     Mental Status: She is alert and oriented to person, place, and time.     Sensory: No sensory deficit.     Motor: No weakness or abnormal muscle tone.     Coordination: Coordination normal.  Psychiatric:         Mood and Affect: Mood normal.        Behavior: Behavior normal.      Imaging: No results found. "

## 2024-08-27 NOTE — Procedures (Signed)
 Lumbar Epidural Steroid Injection - Interlaminar Approach with Fluoroscopic Guidance  Patient: Veronica Arnold      Date of Birth: 12/07/1970 MRN: [id number] PCP: Bevely Doffing, FNP      Visit Date: 08/23/2024   Universal Protocol:     Consent Given By: the patient  Position: PRONE  Additional Comments: Vital signs were monitored before and after the procedure. Patient was prepped and draped in the usual sterile fashion. The correct patient, procedure, and site was verified.   Injection Procedure Details:   Procedure diagnoses: Lumbar radiculopathy [M54.16]   Meds Administered:  Meds ordered this encounter  Medications   methylPREDNISolone  acetate (DEPO-MEDROL ) injection 40 mg     Laterality: Left  Location/Site:  L4-5  Needle: 3.5 in., 20 ga. Tuohy  Needle Placement: Paramedian epidural  Findings:   -Comments: Excellent flow of contrast into the epidural space.  Procedure Details: Using a paramedian approach from the side mentioned above, the region overlying the inferior lamina was localized under fluoroscopic visualization and the soft tissues overlying this structure were infiltrated with 4 ml. of 1% Lidocaine without Epinephrine. The Tuohy needle was inserted into the epidural space using a paramedian approach.   The epidural space was localized using loss of resistance along with counter oblique bi-planar fluoroscopic views.  After negative aspirate for air, blood, and CSF, a 2 ml. volume of Isovue-250 was injected into the epidural space and the flow of contrast was observed. Radiographs were obtained for documentation purposes.    The injectate was administered into the level noted above.   Additional Comments:  The patient tolerated the procedure well Dressing: 2 x 2 sterile gauze and Band-Aid    Post-procedure details: Patient was observed during the procedure. Post-procedure instructions were reviewed.  Patient left the clinic in stable condition.

## 2024-08-28 ENCOUNTER — Telehealth: Payer: Self-pay

## 2024-08-28 NOTE — Telephone Encounter (Unsigned)
 Copied from CRM [id number]. Topic: General - Other >> Aug 28, 2024 10:44 AM Donna BRAVO wrote: Reason for CRM: Patient calling asking if the letter for her lawyer for disability is done.    Patient would like to pick it up Monday 09/03/24 Please call Patient when letter is ready to pick up

## 2024-08-28 NOTE — Telephone Encounter (Signed)
 Copied from CRM [id number]. Topic: General - Other >> Aug 28, 2024 10:44 AM Veronica Arnold wrote: Reason for CRM: Patient calling asking if the letter for her lawyer for disability is done.    Patient would like to pick it up Monday 09/03/24 Please call Patient when letter is ready to pick up

## 2024-09-03 ENCOUNTER — Telehealth: Payer: Self-pay

## 2024-09-03 NOTE — Telephone Encounter (Signed)
"  Printed and ready for pick up       "

## 2024-09-03 NOTE — Telephone Encounter (Signed)
 Pt advised they are ready to be picked up

## 2024-09-03 NOTE — Telephone Encounter (Signed)
 Copied from CRM #[id number]. Topic: General - Other >> Sep 03, 2024  3:07 PM Shanda MATSU wrote: Reason for CRM: Patient called in requesting another letter stating that she is unable to return to work, I asked patient for more details as far as start dates for when she is unable to work, patient stated provider should know info, is req a call back in regards to this. I did adv patient that disability paperwork has been printed and is ready for pick up.

## 2024-09-04 ENCOUNTER — Other Ambulatory Visit: Payer: Self-pay

## 2024-09-04 DIAGNOSIS — E785 Hyperlipidemia, unspecified: Secondary | ICD-10-CM

## 2024-09-04 DIAGNOSIS — G8929 Other chronic pain: Secondary | ICD-10-CM

## 2024-09-04 MED ORDER — HYDROCODONE-ACETAMINOPHEN 5-325 MG PO TABS: 1.0000 | ORAL_TABLET | ORAL | 0 refills | Status: AC | PRN

## 2024-09-04 MED ORDER — ATORVASTATIN CALCIUM 40 MG PO TABS: 40.0000 mg | ORAL_TABLET | Freq: Every day | ORAL | 3 refills | Status: AC

## 2024-09-04 NOTE — Telephone Encounter (Signed)
 Copied from CRM [id number]. Topic: Clinical - Medication Refill >> Sep 04, 2024  3:27 PM Everette C wrote: Medication: atorvastatin  (LIPITOR) 40 MG tablet [[id number]]  HYDROcodone -acetaminophen  (NORCO/VICODIN) 5-325 MG tablet [[id number]]  Has the patient contacted their pharmacy? Yes (Agent: If no, request that the patient contact the pharmacy for the refill. If patient does not wish to contact the pharmacy document the reason why and proceed with request.) (Agent: If yes, when and what did the pharmacy advise?)  This is the patient's preferred pharmacy:  [facility] [address], [place] - [address] [place] [address] Phone: [phone] Fax: [phone]  Is this the correct pharmacy for this prescription? Yes If no, delete pharmacy and type the correct one.   Has the prescription been filled recently? Yes  Is the patient out of the medication? Yes  Has the patient been seen for an appointment in the last year OR does the patient have an upcoming appointment? Yes  Can we respond through MyChart? No  Agent: Please be advised that Rx refills may take up to 3 business days. We ask that you follow-up with your pharmacy.

## 2024-09-04 NOTE — Telephone Encounter (Signed)
Patient picked up the envelope

## 2024-09-09 ENCOUNTER — Other Ambulatory Visit: Payer: Self-pay

## 2024-09-09 DIAGNOSIS — I10 Essential (primary) hypertension: Secondary | ICD-10-CM

## 2024-09-09 DIAGNOSIS — M7062 Trochanteric bursitis, left hip: Secondary | ICD-10-CM

## 2024-09-10 ENCOUNTER — Ambulatory Visit: Payer: Self-pay

## 2024-09-10 ENCOUNTER — Other Ambulatory Visit: Payer: Self-pay

## 2024-09-10 NOTE — Telephone Encounter (Signed)
 FYI Only or Action Required?: Action required by provider: medication refill request.  Patient was last seen in primary care on 08/22/2024 by Bevely Doffing, FNP.  Called Nurse Triage reporting Medication Refill.  Symptoms began several months ago.  Interventions attempted: Nothing.  Symptoms are: unchanged.  Triage Disposition: No disposition on file.  Patient/caregiver understands and will follow disposition?:    Answer Assessment - Initial Assessment Questions 1. DRUG NAME: What medicine do you need to have refilled? Hydrocodone  5 mg  Patient requesting refill. Medication is not listed on active profile.  Requesting med to be sent to [facility] pharmacy on file.  Protocols used: Medication Refill and Renewal Call-A-AH

## 2024-09-10 NOTE — Telephone Encounter (Signed)
 Will document in previous triage encounter.

## 2024-09-10 NOTE — Telephone Encounter (Signed)
 Noted at urgent care

## 2024-09-10 NOTE — Telephone Encounter (Signed)
 I just filled #[id number] on 09/04/24.  Has she called pain management to get appointment?  Dr. Onesimo referred her to pain management.
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
## 2024-09-11 ENCOUNTER — Other Ambulatory Visit: Payer: Self-pay

## 2024-09-11 ENCOUNTER — Telehealth: Payer: Self-pay

## 2024-09-11 ENCOUNTER — Emergency Department (HOSPITAL_COMMUNITY)
Admission: EM | Admit: 2024-09-11 | Discharge: 2024-09-11 | Disposition: A | Discharge status: Home / Self Care | Attending: Emergency Medicine | Admitting: Emergency Medicine

## 2024-09-11 ENCOUNTER — Encounter (HOSPITAL_COMMUNITY): Payer: Self-pay | Admitting: Emergency Medicine

## 2024-09-11 DIAGNOSIS — I10 Essential (primary) hypertension: Secondary | ICD-10-CM | POA: Diagnosis not present

## 2024-09-11 DIAGNOSIS — K0889 Other specified disorders of teeth and supporting structures: Secondary | ICD-10-CM

## 2024-09-11 DIAGNOSIS — G8929 Other chronic pain: Secondary | ICD-10-CM

## 2024-09-11 DIAGNOSIS — K029 Dental caries, unspecified: Secondary | ICD-10-CM | POA: Diagnosis not present

## 2024-09-11 DIAGNOSIS — S8002XA Contusion of left knee, initial encounter: Secondary | ICD-10-CM

## 2024-09-11 DIAGNOSIS — T2120XA Burn of second degree of trunk, unspecified site, initial encounter: Secondary | ICD-10-CM

## 2024-09-11 MED ORDER — BACITRACIN ZINC 500 UNIT/GM EX OINT: 1.0000 | TOPICAL_OINTMENT | Freq: Two times a day (BID) | CUTANEOUS | 0 refills | Status: AC

## 2024-09-11 MED ORDER — KETOROLAC TROMETHAMINE 15 MG/ML IJ SOLN
15.0000 mg | Freq: Once | INTRAMUSCULAR | Status: AC
  Administered 2024-09-11: 15 mg via INTRAMUSCULAR
  Filled 2024-09-11: qty 1

## 2024-09-11 MED ORDER — AMOXICILLIN-POT CLAVULANATE 875-125 MG PO TABS
1.0000 | ORAL_TABLET | Freq: Once | ORAL | Status: AC
  Administered 2024-09-11: 1 via ORAL
  Filled 2024-09-11: qty 1

## 2024-09-11 MED ORDER — AMOXICILLIN 500 MG PO CAPS: 500.0000 mg | ORAL_CAPSULE | Freq: Three times a day (TID) | ORAL | 0 refills | Status: DC

## 2024-09-11 MED ORDER — BACITRACIN ZINC 500 UNIT/GM EX OINT
TOPICAL_OINTMENT | CUTANEOUS | Status: AC
  Filled 2024-09-11: qty 0.9

## 2024-09-11 MED ORDER — HYDROCODONE-ACETAMINOPHEN 5-325 MG PO TABS: 1.0000 | ORAL_TABLET | ORAL | 0 refills | Status: DC | PRN

## 2024-09-11 MED ORDER — AMOXICILLIN 500 MG PO CAPS: 500.0000 mg | ORAL_CAPSULE | Freq: Three times a day (TID) | ORAL | 0 refills | Status: AC
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
## 2024-09-11 NOTE — Telephone Encounter (Signed)
Patient currently at ED

## 2024-09-11 NOTE — Telephone Encounter (Signed)
 Rx sent in
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
## 2024-09-11 NOTE — Telephone Encounter (Signed)
 Copied from CRM [id number]. Topic: Clinical - Order For Equipment >> Sep 11, 2024  1:47 PM Veronica Arnold wrote: Reason for CRM: Patient is requesting an order for a walker with a seat to be placed. Would like it sent to: [facility] [address] [place], [place], [place] [address] [phone]  Patient can be reached at [phone]

## 2024-09-11 NOTE — Telephone Encounter (Signed)
 Copied from CRM [id number]. Topic: Clinical - Prescription Issue >> Sep 11, 2024 10:33 AM Rea BROCKS wrote: Reason for CRM: HYDROcodone -acetaminophen  (NORCO/VICODIN) 5-325 MG tablet  Patient called in for refill for prescription. A refill request was previously sent in on 09/04/24 but it shows prescription ended.   [facility] [address], [place] - [address] [place] [address] Phone: [phone] Fax: [phone] Hours: Not open 24 hours

## 2024-09-11 NOTE — Telephone Encounter (Signed)
 Pt advised with verbal understanding

## 2024-09-12 ENCOUNTER — Other Ambulatory Visit: Payer: Self-pay

## 2024-09-12 DIAGNOSIS — G8929 Other chronic pain: Secondary | ICD-10-CM

## 2024-09-12 NOTE — Telephone Encounter (Signed)
 Walker ordered.

## 2024-09-13 ENCOUNTER — Ambulatory Visit (HOSPITAL_COMMUNITY)

## 2024-09-13 NOTE — Telephone Encounter (Signed)
Ordered on Parachute 

## 2024-09-18 ENCOUNTER — Ambulatory Visit: Admitting: Orthopedic Surgery

## 2024-09-18 ENCOUNTER — Encounter: Payer: Self-pay | Admitting: Orthopedic Surgery

## 2024-09-18 DIAGNOSIS — M5442 Lumbago with sciatica, left side: Secondary | ICD-10-CM

## 2024-09-18 DIAGNOSIS — G8929 Other chronic pain: Secondary | ICD-10-CM | POA: Diagnosis not present

## 2024-09-18 MED ORDER — HYDROCODONE-ACETAMINOPHEN 5-325 MG PO TABS: 1.0000 | ORAL_TABLET | ORAL | 0 refills | Status: DC | PRN

## 2024-09-18 NOTE — Patient Instructions (Signed)
 Instructions regarding chronic pain management   You have chronic pain.  You are on chronic opioid therapy.  You will be referred to a chronic pain chronic opioid therapy specialist.  You have 30 days from today to continue getting your medications from this office.  After 30 days you would no longer get prescriptions for opioids from [facility].
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
## 2024-09-19 ENCOUNTER — Telehealth: Payer: Self-pay | Admitting: Orthopedic Surgery

## 2024-09-19 NOTE — Telephone Encounter (Signed)
 Dr. Onesimo pt - pt lvm stating she was calling regarding the script sent in for a cane yesterday.  She is requesting a call back [phone]

## 2024-09-20 ENCOUNTER — Telehealth: Payer: Self-pay | Admitting: Physical Medicine and Rehabilitation

## 2024-09-20 NOTE — Telephone Encounter (Signed)
 Request has been started, needs provider signature before it can be faxed. Will fax and then notify patient tomorrow after it's signed.

## 2024-09-20 NOTE — Telephone Encounter (Signed)
 Dr. Onesimo pt - the pt left another message today and stated she really needs her cane and would appreciate a call back.  [phone]

## 2024-09-20 NOTE — Telephone Encounter (Signed)
 Pt called asking for another injection. Last injection 08/23/24. Please call pt at [phone].

## 2024-09-21 ENCOUNTER — Inpatient Hospital Stay: Payer: Self-pay | Admitting: Family Medicine

## 2024-09-25 ENCOUNTER — Telehealth: Payer: Self-pay | Admitting: Orthopedic Surgery

## 2024-09-25 DIAGNOSIS — G8929 Other chronic pain: Secondary | ICD-10-CM

## 2024-09-25 NOTE — Telephone Encounter (Signed)
 I don't see an auth to approve for insurance. Pt insurance may only approve a 5 day course and not 7, we cannot override that.

## 2024-09-25 NOTE — Telephone Encounter (Signed)
 Dr. Onesimo pt - pt lvm stating that she wasn't asking for another script, she's asking for someone to call the pharmacy and approve what's there bc insurance wouldn't let her get them for seven days.

## 2024-09-26 ENCOUNTER — Telehealth: Payer: Self-pay | Admitting: Physical Medicine and Rehabilitation

## 2024-09-26 ENCOUNTER — Ambulatory Visit (HOSPITAL_COMMUNITY)

## 2024-09-26 ENCOUNTER — Telehealth: Payer: Self-pay

## 2024-09-26 ENCOUNTER — Other Ambulatory Visit: Payer: Self-pay

## 2024-09-26 DIAGNOSIS — G8929 Other chronic pain: Secondary | ICD-10-CM

## 2024-09-26 MED ORDER — HYDROCODONE-ACETAMINOPHEN 5-325 MG PO TABS: 1.0000 | ORAL_TABLET | ORAL | 0 refills | Status: DC | PRN

## 2024-09-26 NOTE — Addendum Note (Signed)
 Addended by: ONESIMO ANES A on: 09/26/2024 12:57 PM   Modules accepted: Orders

## 2024-09-26 NOTE — Telephone Encounter (Signed)
 Refill sent to pharmacy.

## 2024-09-26 NOTE — Telephone Encounter (Signed)
 Copied from CRM #[id number]. Topic: Clinical - Medication Refill >> Sep 26, 2024  1:11 PM Everette C wrote: Medication: HYDROcodone -acetaminophen  (NORCO/VICODIN) 5-325 MG tablet [[id number]] - the patient is requesting a modified quantity   Has the patient contacted their pharmacy? Yes (Agent: If no, request that the patient contact the pharmacy for the refill. If patient does not wish to contact the pharmacy document the reason why and proceed with request.) (Agent: If yes, when and what did the pharmacy advise?)  This is the patient's preferred pharmacy:  [facility] [address] [place] - [address] [place] [address] Phone: [phone] Fax: [phone]  Is this the correct pharmacy for this prescription? Yes If no, delete pharmacy and type the correct one.   Has the prescription been filled recently? Yes  Is the patient out of the medication? Yes  Has the patient been seen for an appointment in the last year OR does the patient have an upcoming appointment? Yes  Can we respond through MyChart? No  Agent: Please be advised that Rx refills may take up to 3 business days. We ask that you follow-up with your pharmacy.

## 2024-09-26 NOTE — Telephone Encounter (Signed)
 The patient keeps calling.  I called [facility] and they stated they need a new script to be sent in and then the pt should be able to get it filled.  Can you send one in?  It's for the Hydrocodone .

## 2024-09-26 NOTE — Telephone Encounter (Signed)
Patient called. She would like an appointment with Dr. Newton.  

## 2024-09-27 ENCOUNTER — Ambulatory Visit (HOSPITAL_COMMUNITY)

## 2024-10-04 ENCOUNTER — Ambulatory Visit: Admitting: Physical Medicine and Rehabilitation

## 2024-10-04 ENCOUNTER — Encounter: Payer: Self-pay | Admitting: Physical Medicine and Rehabilitation

## 2024-10-04 DIAGNOSIS — G894 Chronic pain syndrome: Secondary | ICD-10-CM

## 2024-10-04 DIAGNOSIS — M5416 Radiculopathy, lumbar region: Secondary | ICD-10-CM

## 2024-10-04 DIAGNOSIS — G8929 Other chronic pain: Secondary | ICD-10-CM

## 2024-10-04 DIAGNOSIS — M5442 Lumbago with sciatica, left side: Secondary | ICD-10-CM

## 2024-10-04 NOTE — Progress Notes (Signed)
 "  Veronica Arnold - 54 y.o. female MRN [id number]  Date of birth: [birth year]  Office Visit Note: Visit Date: 10/04/2024 PCP: Bevely Doffing, FNP Referred by: Bevely Doffing, FNP  Subjective: Chief Complaint  Patient presents with   Lower Back - Pain   HPI: Veronica Arnold is a 54 y.o. female who comes in today for evaluation of chronic, worsening and severe left sided lower back pain radiating to buttock and down lateral leg to ankle. Also reports numbness/tingling distal to left knee. She is here today for follow up post lumbar epidural steroid injection. She underwent left L4-L5 interlaminar epidural steroid injection in our office on 08/23/2025. She reports no relief of pain with this procedure. Her pain worsens with standing and activity, currently rates as 8 out of 10. Some relief of pain with formal physical therapy. She is prescribed Norco by Dr. Onesimo. States she is trying to get established with chronic pain management. Recent lumbar MRI imaging shows moderate facet arthropathy and moderate bilateral foraminal stenosis at L4-L5. No high grade spinal canal stenosis. She is currently using cane to assist with ambulation.      Review of Systems  Musculoskeletal:  Positive for back pain.  Neurological:  Negative for tingling, sensory change, focal weakness and weakness.  All other systems reviewed and are negative.  Otherwise per HPI.  Assessment & Plan: Visit Diagnoses:    ICD-10-CM   1. Chronic left-sided low back pain with left-sided sciatica  M54.42 Ambulatory referral to Physical Medicine Rehab   G89.29     2. Lumbar radiculopathy  M54.16 Ambulatory referral to Physical Medicine Rehab    3. Chronic pain syndrome  G89.4 Ambulatory referral to Physical Medicine Rehab       Plan: Findings:  Chronic, worsening and severe left sided lower back pain radiating to buttock and down lateral leg to ankle. Patient continues to have severe pain despite good conservative therapy such as  formal physical therapy, home exercise regimen, rest and use of medications.  Patient's clinical presentation and exam are consistent with lumbar radiculopathy, more of an L5 nerve distribution.  Prior interlaminar epidural steroid injection did not provide lasting relief of pain. We discussed treatment plan in detail today. Next step is to place order for diagnostic and hopefully left L5 transforaminal epidural steroid injection under fluoroscopic guidance. If good relief of pain with injection we can repeat this procedure this procedure infrequently as needed. Patient has no questions at this time. No red flag symptoms noted upon exam today.     Meds & Orders: No orders of the defined types were placed in this encounter.   Orders Placed This Encounter  Procedures   Ambulatory referral to Physical Medicine Rehab    Follow-up: Return for Left L5 transforaminal epidural steroid injection.   Procedures: No procedures performed      Clinical History: CLINICAL DATA:  Low back pain   EXAM: MRI LUMBAR SPINE WITHOUT CONTRAST   TECHNIQUE: Multiplanar, multisequence MR imaging of the lumbar spine was performed. No intravenous contrast was administered.   COMPARISON:  None Available.   FINDINGS: Bone marrow: No significant abnormality   Conus and cauda equina: No significant abnormality   Paraspinal tissues: No significant abnormality   L1-L2: Normal   L2-L3: Normal   L3-L4: Mild degenerative disc disease with mild disc bulge. No significant facet disease.   L4-L5: There is mild degenerative disc disease with a mild disc bulge. There is facet arthropathy with facet enlargement. There is  mild spinal stenosis. There is moderate bilateral neural foraminal stenosis   L5-S1: There is mild degenerative disc disease with a mild disc bulge. There is mild facet arthropathy. There is no spinal stenosis. There is mild bilateral neural foraminal stenosis.   IMPRESSION: 1. Mild  degenerative disc disease with disc bulge at L4-5 and moderate bilateral facet arthropathy. There is moderate bilateral neural foraminal stenosis and mild spinal stenosis. 2. Mild degenerative changes at at L3-4 and L5-S1     Electronically Signed   By: Veronica Arnold M.D.   On: 06/22/2024 11:56   She reports that she has been smoking cigarettes. She has a 12.5 pack-year smoking history. She has never used smokeless tobacco.  Recent Labs    12/28/23 1638 08/22/24 1602  HGBA1C 6.0* 6.0*    Objective:  VS:  HT:    WT:   BMI:     BP:   HR: bpm  TEMP: ( )  RESP:  Physical Exam Vitals and nursing note reviewed.  HENT:     Head: Normocephalic and atraumatic.     Right Ear: External ear normal.     Left Ear: External ear normal.     Nose: Nose normal.     Mouth/Throat:     Mouth: Mucous membranes are moist.  Eyes:     Extraocular Movements: Extraocular movements intact.  Cardiovascular:     Rate and Rhythm: Normal rate.     Pulses: Normal pulses.  Pulmonary:     Effort: Pulmonary effort is normal.  Abdominal:     General: Abdomen is flat. There is no distension.  Musculoskeletal:        General: Tenderness present.     Cervical back: Normal range of motion.     Comments: Patient is slow to rise from seated position to standing. Good lumbar range of motion. No pain noted with facet loading. 5/5 strength noted with bilateral hip flexion, knee flexion/extension, ankle dorsiflexion/plantarflexion and EHL. No clonus noted bilaterally. No pain upon palpation of greater trochanters. No pain with internal/external rotation of bilateral hips. Sensation intact bilaterally. Dysesthesias noted to left L5 dermatome. Negative slump test bilaterally. Ambulates with cane, gait slow and unsteady.   Skin:    General: Skin is warm and dry.     Capillary Refill: Capillary refill takes less than 2 seconds.  Neurological:     Mental Status: She is alert and oriented to person, place, and time.      Gait: Gait abnormal.  Psychiatric:        Mood and Affect: Mood normal.        Behavior: Behavior normal.     Ortho Exam  Imaging: No results found.  Past Medical/Family/Surgical/Social History: Medications & Allergies reviewed per EMR, new medications updated. Patient Active Problem List   Diagnosis Date Noted   Chronic left-sided low back pain with left-sided sciatica 01/24/2024   Greater trochanteric bursitis of left hip 12/28/2023   Bilateral hand pain 10/04/2023   Right foot pain 04/26/2023   Wheezing 01/26/2023   Prediabetes 12/02/2022   Vitamin D  deficiency 12/02/2022   Colon cancer screening 12/02/2022   Breast cancer screening by mammogram 12/02/2022   Need for Tdap vaccination 12/02/2022   Encounter for general adult medical examination with abnormal findings 07/19/2022   Essential hypertension 11/30/2015   Hyperlipidemia 11/30/2015   AP (abdominal pain) 11/30/2015   Cigarette nicotine dependence without complication 11/30/2015   Past Medical History:  Diagnosis Date   High cholesterol  Hypertension    Family History  Problem Relation Age of Onset   [name]    Stroke Brother    Heart attack Brother    Past Surgical History:  Procedure Laterality Date   HERNIA REPAIR     TUBAL LIGATION     TUBAL LIGATION     Social History   Occupational History   Not on file  Tobacco Use   Smoking status: Every Day    Current packs/day: 0.50    Average packs/day: 0.5 packs/day for 25.0 years (12.5 ttl pk-yrs)    Types: Cigarettes   Smokeless tobacco: Never  Vaping Use   Vaping status: Never Used  Substance and Sexual Activity   Alcohol use: No    Alcohol/week: 0.0 standard drinks of alcohol   Drug use: No   Sexual activity: Yes    Birth control/protection: Surgical    "

## 2024-10-04 NOTE — Progress Notes (Signed)
 Pain Scale   Average Pain 9 Patient advising she has chronic lower back pain that radiates to right side pain is constant. Patient advising the injection she got 12/18/5 did not help her pain.       +Driver, -BT, -Dye Allergies.

## 2024-10-05 ENCOUNTER — Other Ambulatory Visit: Payer: Self-pay

## 2024-10-05 ENCOUNTER — Telehealth: Payer: Self-pay

## 2024-10-05 DIAGNOSIS — G8929 Other chronic pain: Secondary | ICD-10-CM

## 2024-10-05 DIAGNOSIS — M5442 Lumbago with sciatica, left side: Secondary | ICD-10-CM

## 2024-10-05 DIAGNOSIS — F411 Generalized anxiety disorder: Secondary | ICD-10-CM

## 2024-10-05 DIAGNOSIS — M7062 Trochanteric bursitis, left hip: Secondary | ICD-10-CM

## 2024-10-05 MED ORDER — HYDROCODONE-ACETAMINOPHEN 5-325 MG PO TABS: 1.0000 | ORAL_TABLET | ORAL | 0 refills | Status: DC | PRN

## 2024-10-05 MED ORDER — DIAZEPAM 5 MG PO TABS: ORAL_TABLET | ORAL | 0 refills | Status: AC

## 2024-10-05 MED ORDER — PREGABALIN 75 MG PO CAPS: 75.0000 mg | ORAL_CAPSULE | Freq: Two times a day (BID) | ORAL | 2 refills | Status: AC

## 2024-10-05 NOTE — Telephone Encounter (Unsigned)
 Copied from CRM #[id number]. Topic: Clinical - Medication Refill >> Oct 05, 2024 10:14 AM Darshell M wrote: Medication: HYDROcodone -acetaminophen  (NORCO/VICODIN) 5-325 MG tablet  Has the patient contacted their pharmacy? Yes (Agent: If no, request that the patient contact the pharmacy for the refill. If patient does not wish to contact the pharmacy document the reason why and proceed with request.) (Agent: If yes, when and what did the pharmacy advise?)  This is the patient's preferred pharmacy:  [facility] [address], [place] - [address] [place] [address] Phone: [phone] Fax: [phone]  Is this the correct pharmacy for this prescription? Yes If no, delete pharmacy and type the correct one.   Has the prescription been filled recently? No  Is the patient out of the medication? Yes  Has the patient been seen for an appointment in the last year OR does the patient have an upcoming appointment? Yes  Can we respond through MyChart? No  Agent: Please be advised that Rx refills may take up to 3 business days. We ask that you follow-up with your pharmacy.

## 2024-10-05 NOTE — Telephone Encounter (Signed)
 Pending in pts chart

## 2024-10-05 NOTE — Telephone Encounter (Signed)
 Pre Procedural Valium 

## 2024-10-05 NOTE — Addendum Note (Signed)
 Addended by: ELDONNA GARDINER POUR on: 10/05/2024 09:06 AM   Modules accepted: Orders

## 2024-10-05 NOTE — Telephone Encounter (Signed)
 Copied from CRM #[id number]. Topic: Clinical - Medication Question >> Oct 05, 2024 12:54 PM Gattis SQUIBB wrote: Reason for CRM: pt called about getting her medications today.  Gabapentin  and Hydrocodone ?  They are pending right now,  The pharmacy told her to call her provider.

## 2024-10-05 NOTE — Telephone Encounter (Unsigned)
 Copied from CRM #[id number]. Topic: Clinical - Medication Refill >> Oct 05, 2024  2:50 PM Tysheama G wrote: Medication:  pregabalin  (LYRICA ) 75 MG capsule  Has the patient contacted their pharmacy? Yes (Agent: If no, request that the patient contact the pharmacy for the refill. If patient does not wish to contact the pharmacy document the reason why and proceed with request.) (Agent: If yes, when and what did the pharmacy advise?)  This is the patient's preferred pharmacy:  [facility] [address], [place] - [address] [place] [address] Phone: [phone] Fax: [phone]    Is this the correct pharmacy for this prescription? Yes If no, delete pharmacy and type the correct one.   Has the prescription been filled recently? No  Is the patient out of the medication? Yes  Has the patient been seen for an appointment in the last year OR does the patient have an upcoming appointment? Yes  Can we respond through MyChart? Yes  Agent: Please be advised that Rx refills may take up to 3 business days. We ask that you follow-up with your pharmacy.

## 2024-10-06 ENCOUNTER — Other Ambulatory Visit: Payer: Self-pay

## 2024-10-06 DIAGNOSIS — M7062 Trochanteric bursitis, left hip: Secondary | ICD-10-CM

## 2024-10-10 ENCOUNTER — Telehealth: Payer: Self-pay

## 2024-10-10 NOTE — Telephone Encounter (Signed)
 FL2 form Noted Copied Original placed provider box Francie Huenink) Copy placed front desk folder

## 2024-10-11 ENCOUNTER — Telehealth: Payer: Self-pay

## 2024-10-11 ENCOUNTER — Other Ambulatory Visit: Payer: Self-pay

## 2024-10-11 DIAGNOSIS — G8929 Other chronic pain: Secondary | ICD-10-CM

## 2024-10-11 MED ORDER — HYDROCODONE-ACETAMINOPHEN 5-325 MG PO TABS: 1.0000 | ORAL_TABLET | ORAL | 0 refills | Status: AC | PRN

## 2024-10-11 NOTE — Telephone Encounter (Signed)
Hydrocodone refill sent to pharmacy.

## 2024-10-11 NOTE — Telephone Encounter (Signed)
 Left a vm with diane confirming we received paperwork on 10/10/24   Copied from CRM #[id number]. Topic: General - Other >> Oct 11, 2024  9:48 AM Willma R wrote: Reason for CRM: Diane from [facility] calling to make sure the fax she sent on 02/02 was received, sent [facility] personal care services. Would like a call back to confirm.  Diane can be reached at [phone]

## 2024-10-11 NOTE — Telephone Encounter (Signed)
 Patient advised

## 2024-10-11 NOTE — Telephone Encounter (Signed)
 Copied from CRM [id number]. Topic: Clinical - Medication Refill >> Oct 11, 2024  1:53 PM Delon DASEN wrote: Medication: HYDROcodone -acetaminophen  (NORCO/VICODIN) 5-325 MG tablet  Has the patient contacted their pharmacy? Yes (Agent: If no, request that the patient contact the pharmacy for the refill. If patient does not wish to contact the pharmacy document the reason why and proceed with request.) (Agent: If yes, when and what did the pharmacy advise?)  This is the patient's preferred pharmacy:  [facility] [address] [place] - [address] [place] [address] Phone: [phone] Fax: [phone]    Is this the correct pharmacy for this prescription? Yes If no, delete pharmacy and type the correct one.   Has the prescription been filled recently? Yes  Is the patient out of the medication? Yes  Has the patient been seen for an appointment in the last year OR does the patient have an upcoming appointment? Yes  Can we respond through MyChart? Yes  Agent: Please be advised that Rx refills may take up to 3 business days. We ask that you follow-up with your pharmacy.

## 2024-10-11 NOTE — Telephone Encounter (Signed)
 Copied from CRM [id number]. Topic: Appointments - Appointment Scheduling >> Oct 11, 2024  1:29 PM Antony RAMAN wrote: Patient calling to see why her appt was cancelled for tomorrow. She wanted to see if she could meet with someone else sooner than may because she's wanting a personal care assistant

## 2024-10-12 ENCOUNTER — Ambulatory Visit: Payer: Self-pay

## 2024-10-24 ENCOUNTER — Ambulatory Visit: Payer: Self-pay

## 2024-10-29 ENCOUNTER — Encounter: Admitting: Physical Medicine and Rehabilitation

## 2025-01-14 ENCOUNTER — Ambulatory Visit: Payer: Self-pay
